# Patient Record
Sex: Female | Born: 1980
Health system: Southern US, Community
[De-identification: ages and names within clinical notes are randomized; demographics above are authoritative.]

## PROBLEM LIST (undated history)

## (undated) ENCOUNTER — Inpatient Hospital Stay (HOSPITAL_COMMUNITY): Payer: Self-pay

## (undated) DIAGNOSIS — N6019 Diffuse cystic mastopathy of unspecified breast: Secondary | ICD-10-CM

## (undated) DIAGNOSIS — E559 Vitamin D deficiency, unspecified: Secondary | ICD-10-CM

## (undated) DIAGNOSIS — Z8489 Family history of other specified conditions: Secondary | ICD-10-CM

## (undated) DIAGNOSIS — Z9889 Other specified postprocedural states: Secondary | ICD-10-CM

## (undated) DIAGNOSIS — R112 Nausea with vomiting, unspecified: Secondary | ICD-10-CM

## (undated) DIAGNOSIS — J189 Pneumonia, unspecified organism: Secondary | ICD-10-CM

## (undated) DIAGNOSIS — O139 Gestational [pregnancy-induced] hypertension without significant proteinuria, unspecified trimester: Secondary | ICD-10-CM

## (undated) DIAGNOSIS — D649 Anemia, unspecified: Secondary | ICD-10-CM

## (undated) DIAGNOSIS — Z9049 Acquired absence of other specified parts of digestive tract: Secondary | ICD-10-CM

## (undated) DIAGNOSIS — J302 Other seasonal allergic rhinitis: Secondary | ICD-10-CM

## (undated) DIAGNOSIS — Z98891 History of uterine scar from previous surgery: Secondary | ICD-10-CM

## (undated) DIAGNOSIS — O321XX0 Maternal care for breech presentation, not applicable or unspecified: Secondary | ICD-10-CM

## (undated) HISTORY — DX: Diffuse cystic mastopathy of unspecified breast: N60.19

## (undated) HISTORY — DX: Acquired absence of other specified parts of digestive tract: Z90.49

## (undated) HISTORY — DX: Vitamin D deficiency, unspecified: E55.9

## (undated) HISTORY — DX: Anemia, unspecified: D64.9

---
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
## 2001-06-22 ENCOUNTER — Other Ambulatory Visit: Admission: RE | Admit: 2001-06-22 | Discharge: 2001-06-22 | Payer: Self-pay | Admitting: *Deleted

## 2002-06-20 ENCOUNTER — Other Ambulatory Visit: Admission: RE | Admit: 2002-06-20 | Discharge: 2002-06-20 | Payer: Self-pay | Admitting: *Deleted

## 2003-08-07 ENCOUNTER — Other Ambulatory Visit: Admission: RE | Admit: 2003-08-07 | Discharge: 2003-08-07 | Payer: Self-pay | Admitting: *Deleted

## 2004-01-15 ENCOUNTER — Encounter: Admission: RE | Admit: 2004-01-15 | Discharge: 2004-01-15 | Payer: Self-pay | Admitting: Family Medicine

## 2004-05-12 ENCOUNTER — Encounter: Admission: RE | Admit: 2004-05-12 | Discharge: 2004-05-12 | Payer: Self-pay | Admitting: Family Medicine

## 2004-12-03 ENCOUNTER — Emergency Department (HOSPITAL_COMMUNITY): Admission: EM | Admit: 2004-12-03 | Discharge: 2004-12-03 | Payer: Self-pay | Admitting: Family Medicine

## 2006-06-01 ENCOUNTER — Other Ambulatory Visit: Admission: RE | Admit: 2006-06-01 | Discharge: 2006-06-01 | Payer: Self-pay | Admitting: Family Medicine

## 2006-06-06 ENCOUNTER — Emergency Department (HOSPITAL_COMMUNITY): Admission: EM | Admit: 2006-06-06 | Discharge: 2006-06-06 | Payer: Self-pay | Admitting: Emergency Medicine

## 2007-07-31 ENCOUNTER — Emergency Department (HOSPITAL_COMMUNITY): Admission: EM | Admit: 2007-07-31 | Discharge: 2007-07-31 | Payer: Self-pay | Admitting: Emergency Medicine

## 2008-06-25 ENCOUNTER — Encounter: Admission: RE | Admit: 2008-06-25 | Discharge: 2008-06-25 | Payer: Self-pay | Admitting: Physician Assistant

## 2009-03-24 ENCOUNTER — Emergency Department (HOSPITAL_COMMUNITY): Admission: EM | Admit: 2009-03-24 | Discharge: 2009-03-24 | Payer: Self-pay | Admitting: Emergency Medicine

## 2009-05-26 ENCOUNTER — Inpatient Hospital Stay (HOSPITAL_COMMUNITY): Admission: AD | Admit: 2009-05-26 | Discharge: 2009-05-26 | Payer: Self-pay | Admitting: Obstetrics and Gynecology

## 2009-06-16 ENCOUNTER — Inpatient Hospital Stay (HOSPITAL_COMMUNITY): Admission: AD | Admit: 2009-06-16 | Discharge: 2009-06-16 | Payer: Self-pay | Admitting: Obstetrics and Gynecology

## 2009-06-16 ENCOUNTER — Ambulatory Visit: Payer: Self-pay | Admitting: Advanced Practice Midwife

## 2009-07-11 ENCOUNTER — Ambulatory Visit: Payer: Self-pay | Admitting: Obstetrics and Gynecology

## 2009-07-11 ENCOUNTER — Inpatient Hospital Stay (HOSPITAL_COMMUNITY): Admission: AD | Admit: 2009-07-11 | Discharge: 2009-07-11 | Payer: Self-pay | Admitting: Obstetrics and Gynecology

## 2009-07-12 ENCOUNTER — Inpatient Hospital Stay (HOSPITAL_COMMUNITY): Admission: AD | Admit: 2009-07-12 | Discharge: 2009-07-12 | Payer: Self-pay | Admitting: Obstetrics and Gynecology

## 2009-07-29 ENCOUNTER — Encounter (INDEPENDENT_AMBULATORY_CARE_PROVIDER_SITE_OTHER): Payer: Self-pay | Admitting: Obstetrics and Gynecology

## 2009-07-29 ENCOUNTER — Inpatient Hospital Stay (HOSPITAL_COMMUNITY): Admission: RE | Admit: 2009-07-29 | Discharge: 2009-08-02 | Payer: Self-pay | Admitting: Obstetrics and Gynecology

## 2010-07-09 LAB — CBC
HCT: 20.1 % — ABNORMAL LOW (ref 36.0–46.0)
MCHC: 34.1 g/dL (ref 30.0–36.0)
MCHC: 34.7 g/dL (ref 30.0–36.0)
MCV: 92.8 fL (ref 78.0–100.0)
Platelets: 137 10*3/uL — ABNORMAL LOW (ref 150–400)
Platelets: 149 10*3/uL — ABNORMAL LOW (ref 150–400)
RBC: 2.17 MIL/uL — ABNORMAL LOW (ref 3.87–5.11)
RBC: 3.46 MIL/uL — ABNORMAL LOW (ref 3.87–5.11)
WBC: 10.1 10*3/uL (ref 4.0–10.5)
WBC: 10.9 10*3/uL — ABNORMAL HIGH (ref 4.0–10.5)
WBC: 11.4 10*3/uL — ABNORMAL HIGH (ref 4.0–10.5)

## 2010-07-09 LAB — RPR: RPR Ser Ql: NONREACTIVE

## 2010-07-09 LAB — RH IMMUNE GLOB WKUP(>/=20WKS)(NOT WOMEN'S HOSP)

## 2010-07-10 LAB — URINALYSIS, ROUTINE W REFLEX MICROSCOPIC
Nitrite: NEGATIVE
Specific Gravity, Urine: 1.01 (ref 1.005–1.030)
pH: 6 (ref 5.0–8.0)

## 2010-07-10 LAB — CBC
HCT: 27.8 % — ABNORMAL LOW (ref 36.0–46.0)
Platelets: 229 10*3/uL (ref 150–400)
WBC: 11.6 10*3/uL — ABNORMAL HIGH (ref 4.0–10.5)

## 2010-07-14 LAB — URINALYSIS, ROUTINE W REFLEX MICROSCOPIC
Nitrite: NEGATIVE
Specific Gravity, Urine: 1.015 (ref 1.005–1.030)
Urobilinogen, UA: 0.2 mg/dL (ref 0.0–1.0)

## 2010-07-22 LAB — COMPREHENSIVE METABOLIC PANEL
AST: 14 U/L (ref 0–37)
Albumin: 2.9 g/dL — ABNORMAL LOW (ref 3.5–5.2)
Chloride: 106 mEq/L (ref 96–112)
Creatinine, Ser: 0.42 mg/dL (ref 0.4–1.2)
GFR calc Af Amer: >60 mL/min (ref >60)
Potassium: 3.5 mEq/L (ref 3.5–5.1)
Total Bilirubin: 0.4 mg/dL (ref 0.3–1.2)
Total Protein: 6.2 g/dL (ref 6.0–8.3)

## 2010-07-22 LAB — DIFFERENTIAL
Basophils Absolute: 0 10*3/uL (ref 0.0–0.1)
Basophils Relative: 0 % (ref 0–1)
Eosinophils Absolute: 0 K/uL (ref 0.0–0.7)
Eosinophils Relative: 1 % (ref 0–5)
Lymphocytes Relative: 22 % (ref 12–46)
Lymphs Abs: 2 10*3/uL (ref 0.7–4.0)
Monocytes Absolute: 0.5 10*3/uL (ref 0.1–1.0)
Monocytes Relative: 6 % (ref 3–12)
Neutro Abs: 6.7 K/uL (ref 1.7–7.7)
Neutrophils Relative %: 72 % (ref 43–77)

## 2010-07-22 LAB — CBC
HCT: 26.9 % — ABNORMAL LOW (ref 36.0–46.0)
Hemoglobin: 9.1 g/dL — ABNORMAL LOW (ref 12.0–15.0)
MCHC: 33.7 g/dL (ref 30.0–36.0)
MCV: 90.5 fL (ref 78.0–100.0)
Platelets: 247 10*3/uL (ref 150–400)
RBC: 2.98 MIL/uL — ABNORMAL LOW (ref 3.87–5.11)
RDW: 14.3 % (ref 11.5–15.5)
WBC: 9.4 10*3/uL (ref 4.0–10.5)

## 2010-07-22 LAB — URINALYSIS, ROUTINE W REFLEX MICROSCOPIC
Bilirubin Urine: NEGATIVE
Glucose, UA: NEGATIVE mg/dL
Hgb urine dipstick: NEGATIVE
Ketones, ur: NEGATIVE mg/dL
Nitrite: NEGATIVE
Protein, ur: NEGATIVE mg/dL
Specific Gravity, Urine: 1.024 (ref 1.005–1.030)
Urobilinogen, UA: 0.2 mg/dL (ref 0.0–1.0)
pH: 7 (ref 5.0–8.0)

## 2010-07-22 LAB — COMPREHENSIVE METABOLIC PANEL WITH GFR
ALT: 9 U/L (ref 0–35)
Alkaline Phosphatase: 50 U/L (ref 39–117)
BUN: 8 mg/dL (ref 6–23)
CO2: 24 meq/L (ref 19–32)
Calcium: 8.9 mg/dL (ref 8.4–10.5)
GFR calc non Af Amer: >60 mL/min (ref >60)
Glucose, Bld: 89 mg/dL (ref 70–99)
Sodium: 136 meq/L (ref 135–145)

## 2010-07-22 LAB — PROTIME-INR
INR: 1 (ref 0.00–1.49)
Prothrombin Time: 13.1 s (ref 11.6–15.2)

## 2010-08-24 ENCOUNTER — Emergency Department (HOSPITAL_COMMUNITY): Payer: BC Managed Care – PPO

## 2010-08-24 ENCOUNTER — Emergency Department (HOSPITAL_COMMUNITY)
Admission: EM | Admit: 2010-08-24 | Discharge: 2010-08-24 | Disposition: A | Discharge status: Home / Self Care | Payer: BC Managed Care – PPO | Attending: Emergency Medicine | Admitting: Emergency Medicine

## 2010-08-24 DIAGNOSIS — R1031 Right lower quadrant pain: Secondary | ICD-10-CM | POA: Insufficient documentation

## 2010-08-24 DIAGNOSIS — N390 Urinary tract infection, site not specified: Secondary | ICD-10-CM | POA: Insufficient documentation

## 2010-08-24 DIAGNOSIS — N83209 Unspecified ovarian cyst, unspecified side: Secondary | ICD-10-CM | POA: Insufficient documentation

## 2010-08-24 LAB — CBC
HCT: 41.6 % (ref 36.0–46.0)
Hemoglobin: 13.8 g/dL (ref 12.0–15.0)
MCH: 28.9 pg (ref 26.0–34.0)
MCHC: 33.2 g/dL (ref 30.0–36.0)
MCV: 87.2 fL (ref 78.0–100.0)
RBC: 4.77 MIL/uL (ref 3.87–5.11)

## 2010-08-24 LAB — URINALYSIS, ROUTINE W REFLEX MICROSCOPIC
Ketones, ur: 40 mg/dL — AB
Nitrite: POSITIVE — AB
Protein, ur: 30 mg/dL — AB
Urobilinogen, UA: 4 mg/dL — ABNORMAL HIGH (ref 0.0–1.0)

## 2010-08-24 LAB — LIPASE, BLOOD: Lipase: 25 U/L (ref 11–59)

## 2010-08-24 LAB — COMPREHENSIVE METABOLIC PANEL
ALT: 10 U/L (ref 0–35)
AST: 18 U/L (ref 0–37)
Albumin: 4.6 g/dL (ref 3.5–5.2)
BUN: 9 mg/dL (ref 6–23)
CO2: 25 mEq/L (ref 19–32)
Chloride: 101 mEq/L (ref 96–112)
Creatinine, Ser: 0.66 mg/dL (ref 0.4–1.2)
Potassium: 3.6 mEq/L (ref 3.5–5.1)

## 2010-08-24 LAB — WET PREP, GENITAL
Clue Cells Wet Prep HPF POC: NONE SEEN
Trich, Wet Prep: NONE SEEN

## 2010-08-24 LAB — DIFFERENTIAL
Lymphocytes Relative: 26 % (ref 12–46)
Lymphs Abs: 2.9 10*3/uL (ref 0.7–4.0)
Monocytes Absolute: 0.5 10*3/uL (ref 0.1–1.0)
Monocytes Relative: 4 % (ref 3–12)
Neutro Abs: 7.8 10*3/uL — ABNORMAL HIGH (ref 1.7–7.7)

## 2010-08-24 MED ORDER — IOHEXOL 300 MG/ML  SOLN
100.0000 mL | Freq: Once | INTRAMUSCULAR | Status: AC | PRN
  Administered 2010-08-24: 100 mL via INTRAVENOUS

## 2010-08-26 LAB — GC/CHLAMYDIA PROBE AMP, GENITAL: Chlamydia, DNA Probe: UNDETERMINED

## 2011-01-13 LAB — D-DIMER, QUANTITATIVE: D-Dimer, Quant: <0.22

## 2011-08-03 ENCOUNTER — Encounter (HOSPITAL_COMMUNITY): Payer: Self-pay | Admitting: Cardiology

## 2011-08-03 ENCOUNTER — Emergency Department (INDEPENDENT_AMBULATORY_CARE_PROVIDER_SITE_OTHER)
Admission: EM | Admit: 2011-08-03 | Discharge: 2011-08-03 | Disposition: A | Discharge status: Home / Self Care | Payer: 59 | Source: Home / Self Care | Attending: Emergency Medicine | Admitting: Emergency Medicine

## 2011-08-03 DIAGNOSIS — S39012A Strain of muscle, fascia and tendon of lower back, initial encounter: Secondary | ICD-10-CM

## 2011-08-03 DIAGNOSIS — S335XXA Sprain of ligaments of lumbar spine, initial encounter: Secondary | ICD-10-CM

## 2011-08-03 DIAGNOSIS — H68009 Unspecified Eustachian salpingitis, unspecified ear: Secondary | ICD-10-CM

## 2011-08-03 MED ORDER — FLUTICASONE PROPIONATE 50 MCG/ACT NA SUSP: 2.0000 | Freq: Every day | NASAL | Status: DC

## 2011-08-03 MED ORDER — FEXOFENADINE-PSEUDOEPHED ER 180-240 MG PO TB24: 1.0000 | ORAL_TABLET | Freq: Every day | ORAL | Status: AC

## 2011-08-03 MED ORDER — CYCLOBENZAPRINE HCL 5 MG PO TABS: 5.0000 mg | ORAL_TABLET | Freq: Three times a day (TID) | ORAL | Status: AC | PRN

## 2011-08-03 MED ORDER — AMOXICILLIN 500 MG PO CAPS: 1000.0000 mg | ORAL_CAPSULE | Freq: Three times a day (TID) | ORAL | Status: AC

## 2011-08-03 MED ORDER — PREDNISONE 5 MG PO KIT: 1.0000 | PACK | Freq: Every day | ORAL | Status: DC

## 2011-08-03 MED ORDER — MELOXICAM 15 MG PO TABS: 15.0000 mg | ORAL_TABLET | Freq: Every day | ORAL | Status: AC

## 2011-08-03 NOTE — ED Provider Notes (Signed)
Chief Complaint  Patient presents with  . Otalgia  . Back Pain  . Nasal Congestion    History of Present Illness:   The patient is a 31 year old female who is in today for 2 problems: Bilateral ear pain and pressure, and lumbar pain without radiation.  She has a two-week history of pain and pressure in both of her ears, right worse than left. Her hearing is diminished. There is no drainage. She denies any ringing in the ears or vertigo. She has had some postnasal drip, nasal congestion with clear drainage, sneezing, itching of the nose, itchy, watery eyes, and sinus pressure. She has a history of seasonal allergies.  She also has had a history of lumbar pain has been going on since the birth of her 2 daughters. Has been worse the past month. There is no radiation, no numbness, tingling, weakness, bladder, or bowel complaints.  Review of Systems:  Other than noted above, the patient denies any of the following symptoms: Systemic:  No fevers, chills, sweats, weight loss or gain, fatigue, or tiredness. Eye:  No redness, pain, discharge, itching, blurred vision, or diplopia. ENT:  No headache, nasal congestion, sneezing, itching, epistaxis, ear pain, congestion, decreased hearing, ringing in ears, vertigo, or tinnitus.  No oral lesions, sore throat, pain on swallowing, or hoarseness. Neck:  No mass, tenderness or adenopathy. Lungs:  No coughing, wheezing, or shortness of breath. Skin:  No rash or itching.  PMFSH:  Past medical history, family history, social history, meds, and allergies were reviewed.  Physical Exam:   Vital signs:  BP 134/86  Pulse 86  Temp(Src) 98.2 F (36.8 C) (Oral)  Resp 20  SpO2 98%  LMP 07/26/2011 General:  Alert and oriented.  In no distress.  Skin warm and dry. Eye:  PERRL, full EOMs, lids and conjunctiva normal.   ENT:  TMs and canals clear.  Nasal mucosa not congested and without drainage.  Mucous membranes moist, no oral lesions, normal dentition, pharynx  clear.  No cranial or facial pain to palplation. Neck:  Supple, full ROM.  No adenopathy, tenderness or mass.  Thyroid normal. Lungs:  Breath sounds clear and equal bilaterally.  No wheezes, rales or rhonchi. Heart:  Rhythm regular, without extrasystoles.  No gallops or murmers. Back: There is tenderness to palpation over the paravertebral muscles bilaterally. There was no tenderness to palpation over the midline or the sacroiliac joints. The back has a full range of motion with slight pain. Straight leg raising was negative. Deep tendon reflexes, muscle strength, and sensation were intact and normal. Skin:  Clear, warm and dry.   Assessment:  The primary encounter diagnosis was Eustachian salpingitis. A diagnosis of Lumbar strain was also pertinent to this visit. The eustachian salpingitis is most likely due to allergic rhinitis, possibly complicated by acute bacterial sinusitis. The lumbar strain was likely brought on by her pregnancies. There is no evidence of lumbar radiculopathy.  Plan:   1.  The following meds were prescribed:   New Prescriptions   AMOXICILLIN (AMOXIL) 500 MG CAPSULE    Take 2 capsules (1,000 mg total) by mouth 3 (three) times daily.   CYCLOBENZAPRINE (FLEXERIL) 5 MG TABLET    Take 1 tablet (5 mg total) by mouth 3 (three) times daily as needed for muscle spasms.   FEXOFENADINE-PSEUDOEPHEDRINE (ALLEGRA-D 24) 180-240 MG PER 24 HR TABLET    Take 1 tablet by mouth daily.   FLUTICASONE (FLONASE) 50 MCG/ACT NASAL SPRAY    Place 2 sprays into  the nose daily.   MELOXICAM (MOBIC) 15 MG TABLET    Take 1 tablet (15 mg total) by mouth daily.   PREDNISONE 5 MG KIT    Take 1 kit (5 mg total) by mouth daily after breakfast. Prednisone 5 mg 6 day dosepack.  Take as directed.   2.  The patient was instructed in symptomatic care and handouts were given. 3.  The patient was told to return if becoming worse in any way, if no better in 3 or 4 days, and given some red flag symptoms that would  indicate earlier return.  Follow up:  The patient was told to follow up with an allergist or ENT doctor if no better in 2 weeks for ear problem. I also suggested that she followup with a physical therapist of her back wasn't getting better in a couple weeks. I suggested she not fill the amoxicillin unless she develops any purulent nasal drainage. She was given back exercises to do as well.       Reuben Likes, MD 08/03/11 2152

## 2011-08-03 NOTE — Discharge Instructions (Signed)
Do back exercises twice daily followed by moist  Heat for 10 minutes. If no better in 2 weeks, follow up with primary care.  Back Pain, Adult Low back pain is very common. About 1 in 5 people have back pain.The cause of low back pain is rarely dangerous. The pain often gets better over time.About half of people with a sudden onset of back pain feel better in just 2 weeks. About 8 in 10 people feel better by 6 weeks.  CAUSES Some common causes of back pain include:  Strain of the muscles or ligaments supporting the spine.   Wear and tear (degeneration) of the spinal discs.   Arthritis.   Direct injury to the back.  DIAGNOSIS Most of the time, the direct cause of low back pain is not known.However, back pain can be treated effectively even when the exact cause of the pain is unknown.Answering your caregiver's questions about your overall health and symptoms is one of the most accurate ways to make sure the cause of your pain is not dangerous. If your caregiver needs more information, he or she may order lab work or imaging tests (X-rays or MRIs).However, even if imaging tests show changes in your back, this usually does not require surgery. HOME CARE INSTRUCTIONS For many people, back pain returns.Since low back pain is rarely dangerous, it is often a condition that people can learn to [facility] their own.   Remain active. It is stressful on the back to sit or stand in one place. Do not sit, drive, or stand in one place for more than 30 minutes at a time. Take short walks on level surfaces as soon as pain allows.Try to increase the length of time you walk each day.   Do not stay in bed.Resting more than 1 or 2 days can delay your recovery.   Do not avoid exercise or work.Your body is made to move.It is not dangerous to be active, even though your back may hurt.Your back will likely heal faster if you return to being active before your pain is gone.   Pay attention to your body when  you bend and lift. Many people have less discomfortwhen lifting if they bend their knees, keep the load close to their bodies,and avoid twisting. Often, the most comfortable positions are those that put less stress on your recovering back.   Find a comfortable position to sleep. Use a firm mattress and lie on your side with your knees slightly bent. If you lie on your back, put a pillow under your knees.   Only take over-the-counter or prescription medicines as directed by your caregiver. Over-the-counter medicines to reduce pain and inflammation are often the most helpful.Your caregiver may prescribe muscle relaxant drugs.These medicines help dull your pain so you can more quickly return to your normal activities and healthy exercise.   Put ice on the injured area.   Put ice in a plastic bag.   Place a towel between your skin and the bag.   Leave the ice on for 15 to 20 minutes, 3 to 4 times a day for the first 2 to 3 days. After that, ice and heat may be alternated to reduce pain and spasms.   Ask your caregiver about trying back exercises and gentle massage. This may be of some benefit.   Avoid feeling anxious or stressed.Stress increases muscle tension and can worsen back pain.It is important to recognize when you are anxious or stressed and learn ways to manage it.Exercise  is a great option.  SEEK MEDICAL CARE IF:  You have pain that is not relieved with rest or medicine.   You have pain that does not improve in 1 week.   You have new symptoms.   You are generally not feeling well.  SEEK IMMEDIATE MEDICAL CARE IF:   You have pain that radiates from your back into your legs.   You develop new bowel or bladder control problems.   You have unusual weakness or numbness in your arms or legs.   You develop nausea or vomiting.   You develop abdominal pain.   You feel faint.  Document Released: 04/06/2005 Document Revised: 03/26/2011 Document Reviewed:  08/25/2010 [facility] Patient Information 2012 [place], [place].

## 2011-08-03 NOTE — ED Notes (Signed)
Pt reports bilat ear pain right worse than left with pressure. Has tried OTC zyrtec-D for the past 2 weeks with now relief. Had episode of feeling like things are "ballooning" in throat and ears. Also has had lower back pain for the past month and has been having lower back pain and spasms today.

## 2011-12-01 ENCOUNTER — Other Ambulatory Visit: Payer: Self-pay | Admitting: Internal Medicine

## 2011-12-01 DIAGNOSIS — R1011 Right upper quadrant pain: Secondary | ICD-10-CM

## 2011-12-01 DIAGNOSIS — R1013 Epigastric pain: Secondary | ICD-10-CM

## 2011-12-03 ENCOUNTER — Ambulatory Visit
Admission: RE | Admit: 2011-12-03 | Discharge: 2011-12-03 | Disposition: A | Discharge status: Home / Self Care | Payer: 59 | Source: Ambulatory Visit | Attending: Internal Medicine | Admitting: Internal Medicine

## 2011-12-03 DIAGNOSIS — R1011 Right upper quadrant pain: Secondary | ICD-10-CM

## 2011-12-03 DIAGNOSIS — R1013 Epigastric pain: Secondary | ICD-10-CM

## 2011-12-04 ENCOUNTER — Other Ambulatory Visit (HOSPITAL_COMMUNITY): Payer: Self-pay | Admitting: Internal Medicine

## 2011-12-04 DIAGNOSIS — K8020 Calculus of gallbladder without cholecystitis without obstruction: Secondary | ICD-10-CM

## 2011-12-04 DIAGNOSIS — R1901 Right upper quadrant abdominal swelling, mass and lump: Secondary | ICD-10-CM

## 2011-12-11 ENCOUNTER — Encounter (HOSPITAL_COMMUNITY)
Admission: RE | Admit: 2011-12-11 | Discharge: 2011-12-11 | Disposition: A | Discharge status: Home / Self Care | Payer: 59 | Source: Ambulatory Visit | Attending: Internal Medicine | Admitting: Internal Medicine

## 2011-12-11 DIAGNOSIS — K8020 Calculus of gallbladder without cholecystitis without obstruction: Secondary | ICD-10-CM

## 2011-12-11 DIAGNOSIS — R1901 Right upper quadrant abdominal swelling, mass and lump: Secondary | ICD-10-CM | POA: Insufficient documentation

## 2011-12-11 MED ORDER — TECHNETIUM TC 99M MEBROFENIN IV KIT
5.0000 | PACK | Freq: Once | INTRAVENOUS | Status: AC | PRN
  Administered 2011-12-11: 5 via INTRAVENOUS

## 2013-04-09 ENCOUNTER — Emergency Department (HOSPITAL_COMMUNITY)
Admission: EM | Admit: 2013-04-09 | Discharge: 2013-04-09 | Disposition: A | Discharge status: Home / Self Care | Payer: BC Managed Care – PPO | Source: Home / Self Care | Attending: Family Medicine | Admitting: Family Medicine

## 2013-04-09 ENCOUNTER — Encounter (HOSPITAL_COMMUNITY): Payer: Self-pay | Admitting: Emergency Medicine

## 2013-04-09 DIAGNOSIS — R110 Nausea: Secondary | ICD-10-CM

## 2013-04-09 DIAGNOSIS — J329 Chronic sinusitis, unspecified: Secondary | ICD-10-CM

## 2013-04-09 DIAGNOSIS — R197 Diarrhea, unspecified: Secondary | ICD-10-CM

## 2013-04-09 MED ORDER — ONDANSETRON 8 MG PO TBDP: 8.0000 mg | ORAL_TABLET | Freq: Three times a day (TID) | ORAL | Status: DC | PRN

## 2013-04-09 MED ORDER — AMOXICILLIN-POT CLAVULANATE 875-125 MG PO TABS: 1.0000 | ORAL_TABLET | Freq: Two times a day (BID) | ORAL | Status: DC

## 2013-04-09 MED ORDER — DOXYCYCLINE HYCLATE 100 MG PO CAPS: 100.0000 mg | ORAL_CAPSULE | Freq: Two times a day (BID) | ORAL | Status: DC

## 2013-04-09 NOTE — ED Notes (Signed)
32 yr old is here with complaints, Sinus press, HA, Nasal - green, achy, fatigue since Wednesday. Denies: SOB, Chest Pain

## 2013-04-09 NOTE — ED Provider Notes (Signed)
CSN: [id number]     Arrival date & time 04/09/13  1841 History   First MD Initiated Contact with Patient 04/09/13 1936     Chief Complaint  Patient presents with  . Headache   (Consider location/radiation/quality/duration/timing/severity/associated sxs/prior Treatment) HPI Comments: 32 year old female presents complaining of headache, sinus pressure, purulent nasal discharge, nausea. This began on Wednesday of this past week it has gotten progressively worse. Additionally, she admits to some diarrhea and some mild crampy abdominal pains. She denies vomiting, fever, chills, hematuria, dysuria, frequency, urgency. She has a history of chronic sinus infections. 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  Patient is a 32 y.o. female presenting with headaches.  Headache Associated symptoms: diarrhea, nausea, sinus pressure and sore throat   Associated symptoms: no abdominal pain, no cough, no dizziness, no fever, no myalgias and no vomiting     Past Medical History  Diagnosis Date  . Asthma    Past Surgical History  Procedure Laterality Date  . Cesarean section  07/29/2009   No family history on file. History  Substance Use Topics  . Smoking status: Never Smoker   . Smokeless tobacco: Not on file  . Alcohol Use: Yes     Comment: rare   OB History   Grav Para Term Preterm Abortions TAB SAB Ect Mult Living                 Review of Systems  Constitutional: Negative for fever and chills.  HENT: Positive for sinus pressure and sore throat.   Eyes: Negative for visual disturbance.  Respiratory: Negative for cough and shortness of breath.   Cardiovascular: Negative for chest pain, palpitations and leg swelling.  Gastrointestinal: Positive for nausea and diarrhea. Negative for vomiting and abdominal pain.  Endocrine: Negative for polydipsia and polyuria.  Genitourinary: Negative for dysuria, urgency and  frequency.  Musculoskeletal: Negative for arthralgias and myalgias.  Skin: Negative for rash.  Neurological: Positive for headaches. Negative for dizziness, weakness and light-headedness.    Allergies  Review of patient's allergies indicates no known allergies.  Home Medications   Current Outpatient Rx  Name  Route  Sig  Dispense  Refill  . buPROPion (WELLBUTRIN XL) 150 MG 24 hr tablet   Oral   Take 150 mg by mouth daily. Takes 1 -2 tablets for mood stablizer         . citalopram (CELEXA) 10 MG tablet   Oral   Take 10 mg by mouth daily.         Marland Kitchen amoxicillin-clavulanate (AUGMENTIN) 875-125 MG per tablet   Oral   Take 1 tablet by mouth every 12 (twelve) hours.   14 tablet   0   . EXPIRED: fluticasone (FLONASE) 50 MCG/ACT nasal spray   Nasal   Place 2 sprays into the nose daily.   16 g   0   . ondansetron (ZOFRAN ODT) 8 MG disintegrating tablet   Oral   Take 1 tablet (8 mg total) by mouth every 8 (eight) hours as needed for nausea or vomiting.   20 tablet   0   . PredniSONE 5 MG KIT   Oral   Take 1 kit (5 mg total) by mouth daily after breakfast. Prednisone 5 mg 6 day dosepack.  Take as directed.   1 kit   0    BP 126/86  Pulse 77  Temp(Src) 98.6 F (37 C) (  Oral)  Resp 16  SpO2 100% Physical Exam  Nursing note and vitals reviewed. Constitutional: She is oriented to person, place, and time. Vital signs are normal. She appears well-developed and well-nourished. No distress.  HENT:  Head: Normocephalic and atraumatic.  Nose: Right sinus exhibits maxillary sinus tenderness and frontal sinus tenderness. Left sinus exhibits maxillary sinus tenderness and frontal sinus tenderness.  Mouth/Throat: Uvula is midline, oropharynx is clear and moist and mucous membranes are normal.  Neck: Normal range of motion. Neck supple.  Cardiovascular: Normal rate, regular rhythm and normal heart sounds.   Pulmonary/Chest: Effort normal and breath sounds normal. No respiratory  distress.  Abdominal: Normal appearance and bowel sounds are normal. There is tenderness (mild) in the right lower quadrant and suprapubic area. There is no CVA tenderness.  Lymphadenopathy:    She has no cervical adenopathy.  Neurological: She is alert and oriented to person, place, and time. She has normal strength. Coordination normal.  Skin: Skin is warm and dry. No rash noted. She is not diaphoretic.  Psychiatric: She has a normal mood and affect. Judgment normal.    ED Course  Procedures (including critical care time) Labs Review Labs Reviewed - No data to display Imaging Review No results found.  EKG Interpretation    Date/Time:    Ventricular Rate:    PR Interval:    QRS Duration:   QT Interval:    QTC Calculation:   R Axis:     Text Interpretation:              MDM   1. Nausea   2. Diarrhea   3. Sinusitis    Treating her nausea with Zofran. I believe she likely has a mild gastroenteritis. Provided a prescription for augmentin if the sinus pressure continues to worse over the next few days.  F/U PRN if any worsening abdominal pain.     New Prescriptions   AMOXICILLIN-CLAVULANATE (AUGMENTIN) 875-125 MG PER TABLET    Take 1 tablet by mouth every 12 (twelve) hours.   ONDANSETRON (ZOFRAN ODT) 8 MG DISINTEGRATING TABLET    Take 1 tablet (8 mg total) by mouth every 8 (eight) hours as needed for nausea or vomiting.       Graylon Good, PA-C 04/09/13 1958

## 2013-04-10 NOTE — ED Provider Notes (Signed)
Medical screening examination/treatment/procedure(s) were performed by a resident physician or non-physician practitioner and as the supervising physician I was immediately available for consultation/collaboration.  Kade Rickels, MD    Phillippa Straub S Shaheen Mende, MD 04/10/13 0737 

## 2013-04-18 ENCOUNTER — Other Ambulatory Visit: Payer: Self-pay | Admitting: Physician Assistant

## 2013-05-21 ENCOUNTER — Encounter: Payer: Self-pay | Admitting: Physician Assistant

## 2013-05-21 DIAGNOSIS — F338 Other recurrent depressive disorders: Secondary | ICD-10-CM | POA: Insufficient documentation

## 2013-05-21 DIAGNOSIS — T7840XA Allergy, unspecified, initial encounter: Secondary | ICD-10-CM | POA: Insufficient documentation

## 2013-05-21 DIAGNOSIS — E559 Vitamin D deficiency, unspecified: Secondary | ICD-10-CM | POA: Insufficient documentation

## 2013-05-21 DIAGNOSIS — F419 Anxiety disorder, unspecified: Secondary | ICD-10-CM

## 2013-05-21 DIAGNOSIS — D649 Anemia, unspecified: Secondary | ICD-10-CM

## 2013-05-24 ENCOUNTER — Ambulatory Visit (INDEPENDENT_AMBULATORY_CARE_PROVIDER_SITE_OTHER): Payer: BC Managed Care – PPO | Admitting: Physician Assistant

## 2013-05-24 ENCOUNTER — Encounter: Payer: Self-pay | Admitting: Physician Assistant

## 2013-05-24 ENCOUNTER — Other Ambulatory Visit (HOSPITAL_COMMUNITY)
Admission: RE | Admit: 2013-05-24 | Discharge: 2013-05-24 | Disposition: A | Discharge status: Home / Self Care | Payer: BC Managed Care – PPO | Source: Ambulatory Visit | Attending: Physician Assistant | Admitting: Physician Assistant

## 2013-05-24 VITALS — BP 120/70 | HR 80 | Temp 99.1°F | Resp 16 | Ht 65.0 in | Wt 164.0 lb

## 2013-05-24 DIAGNOSIS — Z0000 Encounter for general adult medical examination without abnormal findings: Secondary | ICD-10-CM

## 2013-05-24 DIAGNOSIS — Z79899 Other long term (current) drug therapy: Secondary | ICD-10-CM

## 2013-05-24 DIAGNOSIS — K219 Gastro-esophageal reflux disease without esophagitis: Secondary | ICD-10-CM

## 2013-05-24 DIAGNOSIS — Z1151 Encounter for screening for human papillomavirus (HPV): Secondary | ICD-10-CM | POA: Insufficient documentation

## 2013-05-24 DIAGNOSIS — E559 Vitamin D deficiency, unspecified: Secondary | ICD-10-CM

## 2013-05-24 DIAGNOSIS — Z124 Encounter for screening for malignant neoplasm of cervix: Secondary | ICD-10-CM

## 2013-05-24 DIAGNOSIS — Z01419 Encounter for gynecological examination (general) (routine) without abnormal findings: Secondary | ICD-10-CM | POA: Insufficient documentation

## 2013-05-24 LAB — CBC WITH DIFFERENTIAL/PLATELET
Basophils Absolute: 0 10*3/uL (ref 0.0–0.1)
Basophils Relative: 0 % (ref 0–1)
EOS PCT: 1 % (ref 0–5)
Eosinophils Absolute: 0.1 10*3/uL (ref 0.0–0.7)
HEMATOCRIT: 37.2 % (ref 36.0–46.0)
Hemoglobin: 12.6 g/dL (ref 12.0–15.0)
LYMPHS ABS: 2.2 10*3/uL (ref 0.7–4.0)
LYMPHS PCT: 21 % (ref 12–46)
MCH: 29.6 pg (ref 26.0–34.0)
MCHC: 33.9 g/dL (ref 30.0–36.0)
MCV: 87.3 fL (ref 78.0–100.0)
Monocytes Absolute: 0.4 10*3/uL (ref 0.1–1.0)
Monocytes Relative: 4 % (ref 3–12)
Neutro Abs: 8 10*3/uL — ABNORMAL HIGH (ref 1.7–7.7)
Neutrophils Relative %: 74 % (ref 43–77)
PLATELETS: 350 10*3/uL (ref 150–400)
RBC: 4.26 MIL/uL (ref 3.87–5.11)
RDW: 13.1 % (ref 11.5–15.5)
WBC: 10.8 10*3/uL — AB (ref 4.0–10.5)

## 2013-05-24 LAB — HEMOGLOBIN A1C
HEMOGLOBIN A1C: 5.4 % (ref <5.7)
Mean Plasma Glucose: 108 mg/dL (ref <117)

## 2013-05-24 MED ORDER — PANTOPRAZOLE SODIUM 40 MG PO TBEC: 40.0000 mg | DELAYED_RELEASE_TABLET | Freq: Every day | ORAL | Status: DC

## 2013-05-24 NOTE — Patient Instructions (Addendum)
Peptic Ulcer A peptic ulcer is a sore in the lining of in your esophagus (esophageal ulcer), stomach (gastric ulcer), or in the first part of your small intestine (duodenal ulcer). The ulcer causes erosion into the deeper tissue. CAUSES  Normally, the lining of the stomach and the small intestine protects itself from the acid that digests food. The protective lining can be damaged by:  An infection caused by a bacterium called Helicobacter pylori (H. pylori).  Regular use of nonsteroidal anti-inflammatory drugs (NSAIDs), such as ibuprofen or aspirin.  Smoking tobacco. Other risk factors include being older than 22, drinking alcohol excessively, and having a family history of ulcer disease.  SYMPTOMS   Burning pain or gnawing in the area between the chest and the belly button.  Heartburn.  Nausea and vomiting.  Bloating. The pain can be worse on an empty stomach and at night. If the ulcer results in bleeding, it can cause:  Black, tarry stools.  Vomiting of bright red blood.  Vomiting of coffee ground looking materials. DIAGNOSIS  A diagnosis is usually made based upon your history and an exam. Other tests and procedures may be performed to find the cause of the ulcer. Finding a cause will help determine the best treatment. Tests and procedures may include:  Blood tests, stool tests, or breath tests to check for the bacterium H. pylori.  An upper gastrointestinal (GI) series of the esophagus, stomach, and small intestine.  An endoscopy to examine the esophagus, stomach, and small intestine.  A biopsy. TREATMENT  Treatment may include:  Eliminating the cause of the ulcer, such as smoking, NSAIDs, or alcohol.  Medicines to reduce the amount of acid in your digestive tract.  Antibiotic medicines if the ulcer is caused by the H. pylori bacterium.  An upper endoscopy to treat a bleeding ulcer.  Surgery if the bleeding is severe or if the ulcer created a hole somewhere in the  digestive system. HOME CARE INSTRUCTIONS   Avoid tobacco, alcohol, and caffeine. Smoking can increase the acid in the stomach, and continued smoking will impair the healing of ulcers.  Avoid foods and drinks that seem to cause discomfort or aggravate your ulcer.  Only take medicines as directed by your caregiver. Do not substitute over-the-counter medicines for prescription medicines without talking to your caregiver.  Keep any follow-up appointments and tests as directed. SEEK MEDICAL CARE IF:   Your do not improve within 7 days of starting treatment.  You have ongoing indigestion or heartburn. SEEK IMMEDIATE MEDICAL CARE IF:   You have sudden, sharp, or persistent abdominal pain.  You have bloody or dark black, tarry stools.  You vomit blood or vomit that looks like coffee grounds.  You become light headed, weak, or feel faint.  You become sweaty or clammy. MAKE SURE YOU:   Understand these instructions.  Will watch your condition.  Will get help right away if you are not doing well or get worse. Document Released: 04/03/2000 Document Revised: 12/30/2011 Document Reviewed: 11/04/2011 [facility] Patient Information 2014 [place]. Diet for Gastroesophageal Reflux Disease, Adult Reflux (acid reflux) is when acid from your stomach flows up into the esophagus. When acid comes in contact with the esophagus, the acid causes irritation and soreness (inflammation) in the esophagus. When reflux happens often or so severely that it causes damage to the esophagus, it is called gastroesophageal reflux disease (GERD). Nutrition therapy can help ease the discomfort of GERD. FOODS OR DRINKS TO AVOID OR LIMIT  Smoking or chewing  tobacco. Nicotine is one of the most potent stimulants to acid production in the gastrointestinal tract.  Caffeinated and decaffeinated coffee and black tea.  Regular or low-calorie carbonated beverages or energy drinks (caffeine-free carbonated beverages are  allowed).   Strong spices, such as black pepper, white pepper, red pepper, cayenne, curry powder, and chili powder.  Peppermint or spearmint.  Chocolate.  High-fat foods, including meats and fried foods. Extra added fats including oils, butter, salad dressings, and nuts. Limit these to less than 8 tsp per day.  Fruits and vegetables if they are not tolerated, such as citrus fruits or tomatoes.  Alcohol.  Any food that seems to aggravate your condition. If you have questions regarding your diet, call your caregiver or a registered dietitian. OTHER THINGS THAT MAY HELP GERD INCLUDE:   Eating your meals slowly, in a relaxed setting.  Eating 5 to 6 small meals per day instead of 3 large meals.  Eliminating food for a period of time if it causes distress.  Not lying down until 3 hours after eating a meal.  Keeping the head of your bed raised 6 to 9 inches (15 to 23 cm) by using a foam wedge or blocks under the legs of the bed. Lying flat may make symptoms worse.  Being physically active. Weight loss may be helpful in reducing reflux in overweight or obese adults.  Wear loose fitting clothing EXAMPLE MEAL PLAN This meal plan is approximately 2,000 calories based on [email or web address] meal planning guidelines. Breakfast   cup cooked oatmeal.  1 cup strawberries.  1 cup low-fat milk.  1 oz almonds. Snack  1 cup cucumber slices.  6 oz yogurt (made from low-fat or fat-free milk). Lunch  2 slice whole-wheat bread.  2 oz sliced [place].  2 tsp mayonnaise.  1 cup blueberries.  1 cup snap peas. Snack  6 whole-wheat crackers.  1 oz string cheese. Dinner   cup brown rice.  1 cup mixed veggies.  1 tsp olive oil.  3 oz grilled fish. Document Released: 04/06/2005 Document Revised: 06/29/2011 Document Reviewed: 02/20/2011 [facility] Patient Information 2014 [place], [place].  Follow up with Dr. Marylene Land at [place]

## 2013-05-24 NOTE — Progress Notes (Signed)
Complete Physical HPI Patient presents for complete physical.   Patient's blood pressure has been controlled at home. Patient denies chest pain, shortness of breath, dizziness. BP: 120/70 mmHg  Patient thinks she may have a stomach ulcer, she when to urgent care in June for something else and was given prevacid but continues to get bad. Split up with her husband in May, mutual, but still very stressful. Stomach pain, burning pain, gnawing for 5-6 months, worsening. Occ black stools but states she can associated it with Pepto. Very rarely takes NSAIDS, alcohol once a month if that. Denies weight loss.  RUQ pain too, history of [place] AB- IMPRESSION: Cholelithiasis with no signs of associated cholecystitis. Otherwise unremarkable. Asthma controlled, some use of albuterol inhaler with change in seasons/allergies but very rare.  Depression/anxiety- wellbutrin and celexa helps- no panic attacks Has 32 year old twin girls.   Current Medications:  Current Outpatient Prescriptions on File Prior to Visit  Medication Sig Dispense Refill  . albuterol (PROVENTIL HFA;VENTOLIN HFA) 108 (90 BASE) MCG/ACT inhaler Inhale 2 puffs into the lungs every 6 (six) hours as needed for wheezing or shortness of breath.      Marland Kitchen buPROPion (WELLBUTRIN XL) 150 MG 24 hr tablet Take 150 mg by mouth daily. Takes 1 -2 tablets for mood stablizer      . citalopram (CELEXA) 10 MG tablet Take 10 mg by mouth daily.      Marland Kitchen levonorgestrel (MIRENA) 20 MCG/24HR IUD 1 each by Intrauterine route once.      . valACYclovir (VALTREX) 1000 MG tablet TAKE TWO TABLETS BY MOUTH TWICE DAILY  360 tablet  1  . fluticasone (FLONASE) 50 MCG/ACT nasal spray Place 2 sprays into the nose daily.  16 g  0   No current facility-administered medications on file prior to visit.   Health Maintenance:  Tetanus: 04/2012 Pneumovax: N/A Flu vaccine:2014 Zostavax: N/A  Pap:  2012 normal MGM: N/A DEXA: N/A Colonoscopy: N/A EGD: N.A   Allergies: No Known  Allergies Medical History:  Past Medical History  Diagnosis Date  . Asthma   . Allergy   . Anemia   . Anxiety   . Fibrocystic breast   . Vitamin D deficiency    Surgical History:  Past Surgical History  Procedure Laterality Date  . Cesarean section  07/29/2009  . Wisdom tooth extraction  2002   Family History:  Family History  Problem Relation Age of Onset  . Hypertension Mother   . Diabetes Father   . Cancer Father     prostate  . Cancer Other     breast  . Hypertension Other   . Heart disease Other    Social History:  History   Social History  . Marital Status: Single    Spouse Name: N/A    Number of Children: N/A  . Years of Education: N/A   Occupational History  . Not on file.   Social History Main Topics  . Smoking status: Never Smoker   . Smokeless tobacco: Never Used  . Alcohol Use: Yes     Comment: rare  . Drug Use: No  . Sexual Activity: Yes    Birth Control/ Protection: IUD   Other Topics Concern  . Not on file   Social History Narrative  . No narrative on file   ROS Constitutional: Denies weight loss/gain, headaches, insomnia, fatigue, night sweats, and change in appetite. Eyes: Denies redness, blurred vision, diplopia, discharge, itchy, watery eyes.  ENT: Denies discharge, congestion, post  nasal drip, sore throat, earache, hearing loss, dental pain, Tinnitus, Vertigo, Sinus pain, snoring.  Cardio: Denies chest pain, palpitations, irregular heartbeat, dyspnea, diaphoresis, orthopnea, PND, claudication, edema Respiratory: denies cough, dyspnea, pleurisy, hoarseness, wheezing.  Gastrointestinal: + GERD, AB Pain Denies dysphagia,cramps, nausea, vomiting, bloating, diarrhea, constipation, hematemesis, melena, hematochezia, hemorrhoids Genitourinary: Denies dysuria, frequency, urgency, nocturia, hesitancy, discharge, hematuria, flank pain Breast: Denies Breast lumps, nipple discharge, bleeding.  Musculoskeletal: Denies arthralgia, myalgia,  stiffness, Jt. Swelling, pain, Skin: Denies pruritis, rash, hives,  acne, eczema, changing in skin lesion Neuro: Denies Weakness, tremor, incoordination, spasms, paresthesia, pain Psychiatric: Denies confusion, memory loss, sensory loss Endocrine: Denies change in weight, skin, hair change, nocturia, and paresthesia, Diabetic Denies Polys, visual blurring, hyper /hypo glycemic episodes.  Heme/Lymph: Denies Excessive bleeding, bruising, enlarged lymph nodes  Physical Exam: Estimated body mass index is 27.29 kg/(m^2) as calculated from the following:   Height as of this encounter: 5\' 5"  (1.651 m).   Weight as of this encounter: 164 lb (74.39 kg). Filed Vitals:   05/24/13 1413  BP: 120/70  Pulse: 80  Temp: 99.1 F (37.3 C)  Resp: 16   General Appearance: Well nourished, in no apparent distress. Eyes: PERRLA, EOMs, conjunctiva no swelling or erythema, normal fundi and vessels. Sinuses: No Frontal/maxillary tenderness ENT/Mouth: Ext aud canals clear, normal light reflex with TMs without erythema, bulging.  Good dentition. No erythema, swelling, or exudate on post pharynx. Tonsils not swollen or erythematous. Hearing normal.  Neck: Supple, thyroid normal. No bruits Respiratory: Respiratory effort normal, BS equal bilaterally without rales, rhonchi, wheezing or stridor. Cardio: RRR without murmurs, rubs or gallops. Brisk peripheral pulses without edema.  Chest: symmetric, with normal excursions and percussion. Breasts: Symmetric, without lumps, nipple discharge, retractions. Abdomen: Soft, +BS. Non tender, no guarding, rebound, hernias, masses, or organomegaly. .  Lymphatics: Non tender without lymphadenopathy.  Genitourinary:  Questionable projections on external genitalia, at 10 o clock on labia and at the base of the vaginal opening, normal urethral meatus, vaginal mucosa and cervix, PAP smear obtained. Musculoskeletal: Full ROM all peripheral extremities,5/5 strength, and normal  gait. Skin: Warm, dry without rashes, lesions, ecchymosis.  Neuro: Cranial nerves intact, reflexes equal bilaterally. Normal muscle tone, no cerebellar symptoms. Sensation intact.  Psych: Awake and oriented X 3, normal affect, Insight and Judgment appropriate.   EKG: WNL no changes.  Assessment and Plan: Asthma- continue albuterol PRN, controlled  Allergy- OTC PRN  Anemia- Check labs  Anxiety- controlled  Fibrocystic breast- monitor  Vitamin D deficiency- check labs  GERD vs Ulcer vs Gallstone- will give info, test H Pylori, and give PPI- if not better need EGD. , given dexilant samples and protonix called in PAP sent ? Genital warts- will follow up with OB/GYN Discussed med's effects and SE's. Screening labs and tests as requested with regular follow-up as recommended.   Vicie Mutters 2:50 PM

## 2013-05-25 LAB — HEPATIC FUNCTION PANEL
ALBUMIN: 4.7 g/dL (ref 3.5–5.2)
ALK PHOS: 68 U/L (ref 39–117)
ALT: 11 U/L (ref 0–35)
AST: 17 U/L (ref 0–37)
BILIRUBIN DIRECT: 0.1 mg/dL (ref 0.0–0.3)
BILIRUBIN TOTAL: 0.3 mg/dL (ref 0.2–1.2)
Indirect Bilirubin: 0.2 mg/dL (ref 0.2–1.2)
Total Protein: 7.8 g/dL (ref 6.0–8.3)

## 2013-05-25 LAB — LIPID PANEL
CHOL/HDL RATIO: 4 ratio
Cholesterol: 173 mg/dL (ref 0–200)
HDL: 43 mg/dL (ref >39)
LDL CALC: 111 mg/dL — AB (ref 0–99)
Triglycerides: 95 mg/dL (ref <150)
VLDL: 19 mg/dL (ref 0–40)

## 2013-05-25 LAB — HELICOBACTER PYLORI ABS-IGG+IGA, BLD
H Pylori IgG: <0.4 {ISR}
HELICOBACTER PYLORI AB, IGA: 2.1 U/mL (ref <9.0)

## 2013-05-25 LAB — IRON AND TIBC
%SAT: 20 % (ref 20–55)
IRON: 67 ug/dL (ref 42–145)
TIBC: 334 ug/dL (ref 250–470)
UIBC: 267 ug/dL (ref 125–400)

## 2013-05-25 LAB — MICROALBUMIN / CREATININE URINE RATIO
Creatinine, Urine: 121.6 mg/dL
MICROALB/CREAT RATIO: 6 mg/g (ref 0.0–30.0)
Microalb, Ur: 0.73 mg/dL (ref 0.00–1.89)

## 2013-05-25 LAB — URINALYSIS, ROUTINE W REFLEX MICROSCOPIC
BILIRUBIN URINE: NEGATIVE
GLUCOSE, UA: NEGATIVE mg/dL
HGB URINE DIPSTICK: NEGATIVE
KETONES UR: NEGATIVE mg/dL
Leukocytes, UA: NEGATIVE
Nitrite: NEGATIVE
PH: 6.5 (ref 5.0–8.0)
Protein, ur: NEGATIVE mg/dL
Specific Gravity, Urine: 1.021 (ref 1.005–1.030)
Urobilinogen, UA: 0.2 mg/dL (ref 0.0–1.0)

## 2013-05-25 LAB — BASIC METABOLIC PANEL WITH GFR
BUN: 11 mg/dL (ref 6–23)
CHLORIDE: 103 meq/L (ref 96–112)
CO2: 27 meq/L (ref 19–32)
CREATININE: 0.74 mg/dL (ref 0.50–1.10)
Calcium: 9.7 mg/dL (ref 8.4–10.5)
GFR, Est African American: >89 mL/min
GFR, Est Non African American: >89 mL/min
Glucose, Bld: 89 mg/dL (ref 70–99)
Potassium: 4.1 mEq/L (ref 3.5–5.3)
Sodium: 136 mEq/L (ref 135–145)

## 2013-05-25 LAB — TSH: TSH: 0.976 u[IU]/mL (ref 0.350–4.500)

## 2013-05-25 LAB — VITAMIN D 25 HYDROXY (VIT D DEFICIENCY, FRACTURES): Vit D, 25-Hydroxy: 33 ng/mL (ref 30–89)

## 2013-05-25 LAB — MAGNESIUM: Magnesium: 2 mg/dL (ref 1.5–2.5)

## 2013-05-25 LAB — VITAMIN B12: VITAMIN B 12: 652 pg/mL (ref 211–911)

## 2013-05-25 LAB — INSULIN, FASTING: Insulin fasting, serum: 16 u[IU]/mL (ref 3–28)

## 2013-05-29 ENCOUNTER — Encounter: Payer: Self-pay | Admitting: Physician Assistant

## 2013-07-10 ENCOUNTER — Other Ambulatory Visit: Payer: Self-pay | Admitting: Pediatrics

## 2013-07-13 ENCOUNTER — Other Ambulatory Visit: Payer: Self-pay | Admitting: Physician Assistant

## 2013-08-13 ENCOUNTER — Emergency Department (HOSPITAL_COMMUNITY): Payer: BC Managed Care – PPO

## 2013-08-13 ENCOUNTER — Emergency Department (HOSPITAL_COMMUNITY)
Admission: EM | Admit: 2013-08-13 | Discharge: 2013-08-13 | Disposition: A | Discharge status: Home / Self Care | Payer: BC Managed Care – PPO | Attending: Emergency Medicine | Admitting: Emergency Medicine

## 2013-08-13 ENCOUNTER — Encounter (HOSPITAL_COMMUNITY): Payer: Self-pay | Admitting: Emergency Medicine

## 2013-08-13 DIAGNOSIS — K8050 Calculus of bile duct without cholangitis or cholecystitis without obstruction: Secondary | ICD-10-CM

## 2013-08-13 DIAGNOSIS — Z79899 Other long term (current) drug therapy: Secondary | ICD-10-CM | POA: Insufficient documentation

## 2013-08-13 DIAGNOSIS — Z8742 Personal history of other diseases of the female genital tract: Secondary | ICD-10-CM | POA: Insufficient documentation

## 2013-08-13 DIAGNOSIS — F411 Generalized anxiety disorder: Secondary | ICD-10-CM | POA: Insufficient documentation

## 2013-08-13 DIAGNOSIS — K8020 Calculus of gallbladder without cholecystitis without obstruction: Secondary | ICD-10-CM

## 2013-08-13 DIAGNOSIS — IMO0002 Reserved for concepts with insufficient information to code with codable children: Secondary | ICD-10-CM | POA: Insufficient documentation

## 2013-08-13 DIAGNOSIS — J45909 Unspecified asthma, uncomplicated: Secondary | ICD-10-CM | POA: Insufficient documentation

## 2013-08-13 DIAGNOSIS — E559 Vitamin D deficiency, unspecified: Secondary | ICD-10-CM | POA: Insufficient documentation

## 2013-08-13 DIAGNOSIS — Z862 Personal history of diseases of the blood and blood-forming organs and certain disorders involving the immune mechanism: Secondary | ICD-10-CM | POA: Insufficient documentation

## 2013-08-13 LAB — CBC WITH DIFFERENTIAL/PLATELET
Basophils Absolute: 0 10*3/uL (ref 0.0–0.1)
Basophils Relative: 0 % (ref 0–1)
Eosinophils Absolute: 0.2 10*3/uL (ref 0.0–0.7)
Eosinophils Relative: 1 % (ref 0–5)
HCT: 39.7 % (ref 36.0–46.0)
Hemoglobin: 13.1 g/dL (ref 12.0–15.0)
Lymphocytes Relative: 26 % (ref 12–46)
Lymphs Abs: 2.9 10*3/uL (ref 0.7–4.0)
MCH: 29.8 pg (ref 26.0–34.0)
MCHC: 33 g/dL (ref 30.0–36.0)
MCV: 90.2 fL (ref 78.0–100.0)
Monocytes Absolute: 0.5 10*3/uL (ref 0.1–1.0)
Monocytes Relative: 5 % (ref 3–12)
Neutro Abs: 7.6 10*3/uL (ref 1.7–7.7)
Neutrophils Relative %: 68 % (ref 43–77)
Platelets: 288 10*3/uL (ref 150–400)
RBC: 4.4 MIL/uL (ref 3.87–5.11)
RDW: 12.8 % (ref 11.5–15.5)
WBC: 11.2 10*3/uL — ABNORMAL HIGH (ref 4.0–10.5)

## 2013-08-13 LAB — COMPREHENSIVE METABOLIC PANEL
ALT: 11 U/L (ref 0–35)
AST: 16 U/L (ref 0–37)
Albumin: 4.2 g/dL (ref 3.5–5.2)
Alkaline Phosphatase: 73 U/L (ref 39–117)
BUN: 12 mg/dL (ref 6–23)
CO2: 23 mEq/L (ref 19–32)
Calcium: 9.7 mg/dL (ref 8.4–10.5)
Chloride: 101 mEq/L (ref 96–112)
Creatinine, Ser: 0.67 mg/dL (ref 0.50–1.10)
GFR calc Af Amer: >90 mL/min (ref >90)
GFR calc non Af Amer: >90 mL/min (ref >90)
Glucose, Bld: 88 mg/dL (ref 70–99)
Potassium: 3.9 mEq/L (ref 3.7–5.3)
Sodium: 138 mEq/L (ref 137–147)
Total Bilirubin: 0.2 mg/dL — ABNORMAL LOW (ref 0.3–1.2)
Total Protein: 7.9 g/dL (ref 6.0–8.3)

## 2013-08-13 LAB — LIPASE, BLOOD: Lipase: 34 U/L (ref 11–59)

## 2013-08-13 MED ORDER — HYDROMORPHONE HCL PF 1 MG/ML IJ SOLN
1.0000 mg | Freq: Once | INTRAMUSCULAR | Status: AC
  Administered 2013-08-13: 1 mg via INTRAVENOUS
  Filled 2013-08-13: qty 1

## 2013-08-13 MED ORDER — OXYCODONE-ACETAMINOPHEN 5-325 MG PO TABS: ORAL_TABLET | ORAL | Status: DC

## 2013-08-13 MED ORDER — PROMETHAZINE HCL 25 MG PO TABS: 25.0000 mg | ORAL_TABLET | Freq: Four times a day (QID) | ORAL | Status: DC | PRN

## 2013-08-13 MED ORDER — IBUPROFEN 600 MG PO TABS: 600.0000 mg | ORAL_TABLET | Freq: Four times a day (QID) | ORAL | Status: DC | PRN

## 2013-08-13 MED ORDER — SODIUM CHLORIDE 0.9 % IV BOLUS (SEPSIS)
1000.0000 mL | INTRAVENOUS | Status: AC
  Administered 2013-08-13: 1000 mL via INTRAVENOUS

## 2013-08-13 NOTE — ED Provider Notes (Signed)
CSN: [id number]     Arrival date & time 08/13/13  1910 History   First MD Initiated Contact with Patient 08/13/13 1955     Chief Complaint  Patient presents with  . Abdominal Pain     (Consider location/radiation/quality/duration/timing/severity/associated sxs/prior Treatment) HPI Pt is a 33yo female with hx of gallstones presenting to ED c/o RUQ pain with associated nausea that started around 14:30 this afternoon while pt was taking a shower. Pain is constant, waxing and waning, sharp, stabbing, aching, 8/10 at worst. Has not tried any pain medication PTA. Reports nausea but denies vomiting or diarrhea. Does states she feels more bloated today. Denies urinary or vaginal symptoms. Pt states last she had an ultrasound of her gallbladder which showed multiple gall stones.  States over last 82mo she has had "stomah ulcer type symptoms" and was placed on pantoprazole by her PCP with minimal relief. Abdominal surgical hx significant for c-section. She has not seen general surgery.  Last ate grilled chicken around 13:00 or 14:00 this afternoon.   Past Medical History  Diagnosis Date  . Asthma   . Allergy   . Anemia   . Anxiety   . Fibrocystic breast   . Vitamin D deficiency    Past Surgical History  Procedure Laterality Date  . Cesarean section  07/29/2009  . Wisdom tooth extraction  2002   Family History  Problem Relation Age of Onset  . Hypertension Mother   . Diabetes Father   . Cancer Father     prostate  . Cancer Other     breast  . Hypertension Other   . Heart disease Other    History  Substance Use Topics  . Smoking status: Never Smoker   . Smokeless tobacco: Never Used  . Alcohol Use: Yes     Comment: rare   OB History   Grav Para Term Preterm Abortions TAB SAB Ect Mult Living                 Review of Systems  Constitutional: Negative for fever, chills, appetite change and fatigue.  Respiratory: Negative for shortness of breath.   Cardiovascular: Negative for  chest pain.  Gastrointestinal: Positive for nausea and abdominal pain (RUQ radiating down right flank and into right mid-back). Negative for vomiting and diarrhea.  Genitourinary: Positive for flank pain ( right). Negative for dysuria, frequency, hematuria, vaginal discharge and vaginal pain.  Musculoskeletal: Positive for back pain (right mid back).  All other systems reviewed and are negative.     Allergies  Review of patient's allergies indicates no known allergies.  Home Medications   Prior to Admission medications   Medication Sig Start Date End Date Taking? Authorizing Provider  albuterol (PROVENTIL HFA;VENTOLIN HFA) 108 (90 BASE) MCG/ACT inhaler Inhale 2 puffs into the lungs every 6 (six) hours as needed for wheezing or shortness of breath.   Yes Historical Provider, MD  buPROPion (WELLBUTRIN XL) 150 MG 24 hr tablet Take 150 mg by mouth daily.    Yes Historical Provider, MD  cetirizine-pseudoephedrine (ZYRTEC-D) 5-120 MG per tablet Take 1 tablet by mouth 2 (two) times daily.   Yes Historical Provider, MD  cholecalciferol (VITAMIN D) 1000 UNITS tablet Take 5,000 Units by mouth daily.   Yes Historical Provider, MD  citalopram (CELEXA) 10 MG tablet Take 10 mg by mouth daily.   Yes Historical Provider, MD  levonorgestrel (MIRENA) 20 MCG/24HR IUD 1 each by Intrauterine route once. 09/18/09  Yes Historical Provider, MD  pantoprazole ([place])  40 MG tablet Take 40 mg by mouth daily.   Yes Historical Provider, MD  valACYclovir (VALTREX) 1000 MG tablet Take 1,000 mg by mouth 2 (two) times daily as needed (fever blisters.).   Yes Historical Provider, MD  fluticasone (FLONASE) 50 MCG/ACT nasal spray Place 2 sprays into the nose daily. 08/03/11 08/02/12  Harden Mo, MD   BP 127/64  Pulse 77  Temp(Src) 98.6 F (37 C) (Oral)  Resp 18  SpO2 100% Physical Exam  Nursing note and vitals reviewed. Constitutional: She appears well-developed and well-nourished. No distress.  Pt lying  comfortably in exam bed, NAD.   HENT:  Head: Normocephalic and atraumatic.  Eyes: Conjunctivae are normal. No scleral icterus.  Neck: Normal range of motion.  Cardiovascular: Normal rate, regular rhythm and normal heart sounds.   Pulmonary/Chest: Effort normal and breath sounds normal. No respiratory distress. She has no wheezes. She has no rales. She exhibits no tenderness.  Abdominal: Soft. Bowel sounds are normal. She exhibits no distension and no mass. There is tenderness (RUQ, positive murphy's sign) in the right upper quadrant. There is positive Murphy's sign. There is no rebound, no guarding and no CVA tenderness.  Soft, non-distended, tenderness in RUQ.    Musculoskeletal: Normal range of motion.  Neurological: She is alert.  Skin: Skin is warm and dry. She is not diaphoretic.    ED Course  Procedures (including critical care time) Labs Review Labs Reviewed  CBC WITH DIFFERENTIAL - Abnormal; Notable for the following:    WBC 11.2 (*)    All other components within normal limits  COMPREHENSIVE METABOLIC PANEL - Abnormal; Notable for the following:    Total Bilirubin 0.2 (*)    All other components within normal limits  LIPASE, BLOOD    Imaging Review US Abdomen Complete  08/13/2013   CLINICAL DATA:  Right upper quadrant abdominal pain.  EXAM: ULTRASOUND ABDOMEN COMPLETE  COMPARISON:  Abdominal ultrasound performed 12/03/2011, and CT of the abdomen and pelvis performed 08/24/2010  FINDINGS: Gallbladder:  Multiple mobile stones are seen dependently within the gallbladder, measuring up to 5 mm in size. The gallbladder is otherwise unremarkable in appearance. No pericholecystic fluid or gallbladder wall thickening is seen. No ultrasonographic Murphy's sign is seen.  Common bile duct:  Diameter: 0.2 cm, within normal limits in caliber.  Liver:  No focal lesion identified. Within normal limits in parenchymal echogenicity.  IVC:  No abnormality visualized.  Pancreas:  Visualized portion  unremarkable.  Spleen:  Size and appearance within normal limits.  Right Kidney:  Length: 11.7 cm. Echogenicity within normal limits. No mass or hydronephrosis visualized.  Left Kidney:  Length: 11.1 cm. Echogenicity within normal limits. No mass or hydronephrosis visualized.  Abdominal aorta:  No aneurysm visualized.  Other findings:  None.  IMPRESSION: 1. Cholelithiasis noted; gallbladder otherwise unremarkable in appearance. 2. Otherwise unremarkable abdominal ultrasound.   Electronically Signed   By: Garald Balding M.D.   On: 08/13/2013 22:45     EKG Interpretation None      MDM   Final diagnoses:  Biliary colic  Cholelithiasis    Pt is a 33yo female with hx of gallstones c/o symptoms consistent with biliary colic. Cholecystitis vs cholelithiasis. Will also consider pancreatitis, gastritis.  Pt appears well, non-toxic, afebrile.  Abd-tenderness in RUQ.  Pain improved with IV dilaudid and IV fluids.   CBC, CMP, Lipase: unremarkable.  Abd U/S: cholelithiasis noted; gallbladder otherwise unremarkable in appearance.    Discussed pt with Dr. Winfred Leeds who  also examined pt. Pt may be discharged home to f/u with PCP. Contact info for [facility]. Rx: percocet, ibuprofen, and phenergan.  Return precautions provided. Pt verbalized understanding and agreement with tx plan.     Noland Fordyce, PA-C 08/14/13 [phone]

## 2013-08-13 NOTE — ED Notes (Signed)
Patient is alert and oriented x3.  She is complaining of RUQ pain that started today at  14:30 while she was taking a shower.  She denies ever having this issue before.   Currently she rates her pain 6 of 10 with nausea intermittently.

## 2013-08-13 NOTE — ED Notes (Signed)
US at bedside

## 2013-08-13 NOTE — ED Provider Notes (Signed)
Complains of right upper quadrant pain rating to right flank onset 2 PM today. Ate pizza for lunch. Admits to nausea no vomiting feels improved since treatment in the emergency department. Patient with known gallstones on exam alert nontoxic abdomen nondistended normal active bowel sounds tender at right upper quadrant negative Percell Miller sign  Orlie Dakin, MD 08/13/13 2135

## 2013-08-13 NOTE — Discharge Instructions (Signed)
Cholelithiasis Cholelithiasis (also called gallstones) is a form of gallbladder disease in which gallstones form in your gallbladder. The gallbladder is an organ that stores bile made in the liver, which helps digest fats. Gallstones begin as small crystals and slowly grow into stones. Gallstone pain occurs when the gallbladder spasms and a gallstone is blocking the duct. Pain can also occur when a stone passes out of the duct.  RISK FACTORS  Being female.   Having multiple pregnancies. Health care providers sometimes advise removing diseased gallbladders before future pregnancies.   Being obese.  Eating a diet heavy in fried foods and fat.   Being older than 63 years and increasing age.   Prolonged use of medicines containing female hormones.   Having diabetes mellitus.   Rapidly losing weight.   Having a family history of gallstones (heredity).  SYMPTOMS  Nausea.   Vomiting.  Abdominal pain.   Yellowing of the skin (jaundice).   Sudden pain. It may persist from several minutes to several hours.  Fever.   Tenderness to the touch. In some cases, when gallstones do not move into the bile duct, people have no pain or symptoms. These are called "silent" gallstones.  TREATMENT Silent gallstones do not need treatment. In severe cases, emergency surgery may be required. Options for treatment include:  Surgery to remove the gallbladder. This is the most common treatment.  Medicines. These do not always work and may take 6 12 months or more to work.  Shock wave treatment (extracorporeal biliary lithotripsy). In this treatment an ultrasound machine sends shock waves to the gallbladder to break gallstones into smaller pieces that can pass into the intestines or be dissolved by medicine. HOME CARE INSTRUCTIONS   Only take over-the-counter or prescription medicines for pain, discomfort, or fever as directed by your health care provider.   Follow a low-fat diet until  seen again by your health care provider. Fat causes the gallbladder to contract, which can result in pain.   Follow up with your health care provider as directed. Attacks are almost always recurrent and surgery is usually required for permanent treatment.  SEEK IMMEDIATE MEDICAL CARE IF:   Your pain increases and is not controlled by medicines.   You have a fever or persistent symptoms for more than 2 3 days.   You have a fever and your symptoms suddenly get worse.   You have persistent nausea and vomiting.  MAKE SURE YOU:   Understand these instructions.  Will watch your condition.  Will get help right away if you are not doing well or get worse. Document Released: 04/02/2005 Document Revised: 12/07/2012 Document Reviewed: 09/28/2012 [facility] Patient Information 2014 [place].  Biliary Colic  Biliary colic is a steady or irregular pain in the upper abdomen. It is usually under the right side of the rib cage. It happens when gallstones interfere with the normal flow of bile from the gallbladder. Bile is a liquid that helps to digest fats. Bile is made in the liver and stored in the gallbladder. When you eat a meal, bile passes from the gallbladder through the cystic duct and the common bile duct into the small intestine. There, it mixes with partially digested food. If a gallstone blocks either of these ducts, the normal flow of bile is blocked. The muscle cells in the bile duct contract forcefully to try to move the stone. This causes the pain of biliary colic.  SYMPTOMS   A person with biliary colic usually complains of pain  in the upper abdomen. This pain can be:  In the center of the upper abdomen just below the breastbone.  In the upper-right part of the abdomen, near the gallbladder and liver.  Spread back toward the right shoulder blade.  Nausea and vomiting.  The pain usually occurs after eating.  Biliary colic is usually triggered by the digestive system's  demand for bile. The demand for bile is high after fatty meals. Symptoms can also occur when a person who has been fasting suddenly eats a very large meal. Most episodes of biliary colic pass after 1 to 5 hours. After the most intense pain passes, your abdomen may continue to ache mildly for about 24 hours. DIAGNOSIS  After you describe your symptoms, your caregiver will perform a physical exam. He or she will pay attention to the upper right portion of your belly (abdomen). This is the area of your liver and gallbladder. An ultrasound will help your caregiver look for gallstones. Specialized scans of the gallbladder may also be done. Blood tests may be done, especially if you have fever or if your pain persists. PREVENTION  Biliary colic can be prevented by controlling the risk factors for gallstones. Some of these risk factors, such as heredity, increasing age, and pregnancy are a normal part of life. Obesity and a high-fat diet are risk factors you can change through a healthy lifestyle. Women going through menopause who take hormone replacement therapy (estrogen) are also more likely to develop biliary colic. TREATMENT   Pain medication may be prescribed.  You may be encouraged to eat a fat-free diet.  If the first episode of biliary colic is severe, or episodes of colic keep retuning, surgery to remove the gallbladder (cholecystectomy) is usually recommended. This procedure can be done through small incisions using an instrument called a laparoscope. The procedure often requires a brief stay in the hospital. Some people can leave the hospital the same day. It is the most widely used treatment in people troubled by painful gallstones. It is effective and safe, with no complications in more than 90% of cases.  If surgery cannot be done, medication that dissolves gallstones may be used. This medication is expensive and can take months or years to work. Only small stones will dissolve.  Rarely,  medication to dissolve gallstones is combined with a procedure called shock-wave lithotripsy. This procedure uses carefully aimed shock waves to break up gallstones. In many people treated with this procedure, gallstones form again within a few years. PROGNOSIS  If gallstones block your cystic duct or common bile duct, you are at risk for repeated episodes of biliary colic. There is also a 25% chance that you will develop a gallbladder infection(acute cholecystitis), or some other complication of gallstones within 10 to 20 years. If you have surgery, schedule it at a time that is convenient for you and at a time when you are not sick. HOME CARE INSTRUCTIONS   Drink plenty of clear fluids.  Avoid fatty, greasy or fried foods, or any foods that make your pain worse.  Take medications as directed. SEEK MEDICAL CARE IF:   You develop a fever over 100.5 F (38.1 C).  Your pain gets worse over time.  You develop nausea that prevents you from eating and drinking.  You develop vomiting. SEEK IMMEDIATE MEDICAL CARE IF:   You have continuous or severe belly (abdominal) pain which is not relieved with medications.  You develop nausea and vomiting which is not relieved with medications.  You have symptoms of biliary colic and you suddenly develop a fever and shaking chills. This may signal cholecystitis. Call your caregiver immediately. °· You develop a yellow color to your skin or the white part of your eyes (jaundice). °Document Released: 09/07/2005 Document Revised: 06/29/2011 Document Reviewed: 11/17/2007 °[employer]® Patient Information ©2014 [employer]. ° °

## 2013-08-13 NOTE — ED Notes (Signed)
Pt presents with c/o right lower abdominal pain that radiates to her right flank. No hx of kidney stones.

## 2013-08-15 ENCOUNTER — Ambulatory Visit (INDEPENDENT_AMBULATORY_CARE_PROVIDER_SITE_OTHER): Payer: BC Managed Care – PPO | Admitting: Physician Assistant

## 2013-08-15 ENCOUNTER — Encounter: Payer: Self-pay | Admitting: Physician Assistant

## 2013-08-15 VITALS — BP 112/80 | HR 76 | Temp 98.2°F | Resp 16 | Wt 162.0 lb

## 2013-08-15 DIAGNOSIS — K8020 Calculus of gallbladder without cholecystitis without obstruction: Secondary | ICD-10-CM

## 2013-08-15 NOTE — Progress Notes (Signed)
   Subjective:    Patient ID: Christie Le, female    DOB: [birth year], 34 y.o.   MRN: [id number]  Abdominal Pain Associated symptoms include nausea. Pertinent negatives include no constipation, diarrhea, fever or vomiting.  33 y.o. presents with cholelithiasis. She went to the ER after continuing AB pain radiating to her right shoulder with nausea. US showed stones but no inflammation/sludge, however with her continuing symptoms despite diet change she would like a referral to gen surgery.     Review of Systems  Constitutional: Positive for appetite change. Negative for fever and chills.  HENT: Positive for congestion.   Respiratory: Negative.   Cardiovascular: Negative.   Gastrointestinal: Positive for nausea and abdominal pain. Negative for vomiting, diarrhea, constipation, blood in stool, abdominal distention, anal bleeding and rectal pain.  Genitourinary: Negative.   Musculoskeletal: Negative.   Neurological: Negative.        Objective:   Physical Exam  Constitutional: She is oriented to person, place, and time. She appears well-developed and well-nourished.  Cardiovascular: Normal rate and regular rhythm.   Pulmonary/Chest: Effort normal and breath sounds normal.  Abdominal: Soft. She exhibits no distension. There is tenderness (RUQ, + murphy's). There is guarding. There is no rebound.  Musculoskeletal: Normal range of motion.  Neurological: She is alert and oriented to person, place, and time.      Assessment & Plan:  Symptomatic cholelithiasis - Plan: Ambulatory referral to General Surgery

## 2013-08-15 NOTE — ED Provider Notes (Signed)
Medical screening examination/treatment/procedure(s) were conducted as a shared visit with non-physician practitioner(s) and myself.  I personally evaluated the patient during the encounter.   EKG Interpretation None       Orlie Dakin, MD 08/15/13 0003

## 2013-08-15 NOTE — Patient Instructions (Signed)

## 2013-08-28 ENCOUNTER — Ambulatory Visit (INDEPENDENT_AMBULATORY_CARE_PROVIDER_SITE_OTHER): Payer: BC Managed Care – PPO | Admitting: Surgery

## 2013-09-13 ENCOUNTER — Ambulatory Visit (INDEPENDENT_AMBULATORY_CARE_PROVIDER_SITE_OTHER): Payer: BC Managed Care – PPO | Admitting: Surgery

## 2013-09-13 ENCOUNTER — Encounter (INDEPENDENT_AMBULATORY_CARE_PROVIDER_SITE_OTHER): Payer: Self-pay | Admitting: Surgery

## 2013-09-13 VITALS — BP 118/80 | HR 84 | Temp 98.0°F | Ht 66.0 in | Wt 163.0 lb

## 2013-09-13 DIAGNOSIS — K8020 Calculus of gallbladder without cholecystitis without obstruction: Secondary | ICD-10-CM

## 2013-09-13 NOTE — Progress Notes (Signed)
Chief Complaint:  Recurrent right upper quadrant abdominal pain and gallstones.    History of Present Illness:  Christie Le is an 32 y.o. female who has been having recurrent RUQ abdominal pain for several years and is worse recently.  She has 32 year old twins.  She has cholelithiasis on ultrasound and symptoms characteristic of chronic cholecystitis.  I explained the operation to her in some detail including complications not limited to common bile duct injury bleeding bile leaks. She like to go ahead and get this scheduled as soon as possible. Will move toward scheduling. I gave her a booklet on cholecystectomy and went over it in some detail as well.  Past Medical History  Diagnosis Date  . Asthma   . Allergy   . Anemia   . Anxiety   . Fibrocystic breast   . Vitamin D deficiency     Past Surgical History  Procedure Laterality Date  . Cesarean section  07/29/2009  . Wisdom tooth extraction  2002    Current Outpatient Prescriptions  Medication Sig Dispense Refill  . albuterol (PROVENTIL HFA;VENTOLIN HFA) 108 (90 BASE) MCG/ACT inhaler Inhale 2 puffs into the lungs every 6 (six) hours as needed for wheezing or shortness of breath.      . cetirizine-pseudoephedrine (ZYRTEC-D) 5-120 MG per tablet Take 1 tablet by mouth 2 (two) times daily.      . cholecalciferol (VITAMIN D) 1000 UNITS tablet Take 5,000 Units by mouth daily.      . citalopram (CELEXA) 10 MG tablet Take 10 mg by mouth daily.      . ibuprofen (ADVIL,MOTRIN) 600 MG tablet Take 1 tablet (600 mg total) by mouth every 6 (six) hours as needed.  30 tablet  0  . levonorgestrel (MIRENA) 20 MCG/24HR IUD 1 each by Intrauterine route once.      . pantoprazole (PROTONIX) 40 MG tablet Take 40 mg by mouth daily.      . promethazine (PHENERGAN) 25 MG tablet Take 1 tablet (25 mg total) by mouth every 6 (six) hours as needed for nausea or vomiting.  12 tablet  0  . valACYclovir (VALTREX) 1000 MG tablet Take 1,000 mg by mouth 2 (two) times  daily as needed (fever blisters.).      . fluticasone (FLONASE) 50 MCG/ACT nasal spray Place 2 sprays into the nose daily.  16 g  0   No current facility-administered medications for this visit.   Review of patient's allergies indicates no known allergies. Family History  Problem Relation Age of Onset  . Hypertension Mother   . Diabetes Father   . Cancer Father     prostate  . Cancer Other     breast  . Hypertension Other   . Heart disease Other    Social History:   reports that she has never smoked. She has never used smokeless tobacco. She reports that she drinks alcohol. She reports that she does not use illicit drugs.   REVIEW OF SYSTEMS - PERTINENT POSITIVES ONLY: Asthma onset around age 23. Otherwise positive for headaches, nausea, diarrhea, constipation, abdominal pain and blood in stool.  Physical Exam:   Blood pressure 118/80, pulse 84, temperature 98 F (36.7 C), height 5' 6" (1.676 m), weight 163 lb (73.936 kg). Body mass index is 26.32 kg/(m^2).  Gen:  WDWN white female NAD  Neurological: Alert and oriented to person, place, and time. Motor and sensory function is grossly intact  Head: Normocephalic and atraumatic.  Eyes: Conjunctivae are normal. Pupils   are equal, round, and reactive to light. No scleral icterus.  Neck: Normal range of motion. Neck supple. No tracheal deviation or thyromegaly present.  Cardiovascular:  SR without murmurs or gallops.  No carotid bruits Respiratory: Effort normal.  No respiratory distress. No chest wall tenderness. Breath sounds normal.  No wheezes, rales or rhonchi.  Abdomen:  nontender today GU: Musculoskeletal: Normal range of motion. Extremities are nontender. No cyanosis, edema or clubbing noted Lymphadenopathy: No cervical, preauricular, postauricular or axillary adenopathy is present Skin: Skin is warm and dry. No rash noted. No diaphoresis. No erythema. No pallor. Pscyh: Normal mood and affect. Behavior is normal. Judgment and  thought content normal.   LABORATORY RESULTS: No results found for this or any previous visit (from the past 48 hour(s)).  RADIOLOGY RESULTS: No results found.  Problem List: Patient Active Problem List   Diagnosis Date Noted  . Allergy   . Anemia   . Anxiety   . Vitamin D deficiency     Assessment & Plan: Symptomatic gallstones. Plan laparoscopic cholecystectomy with probable intraoperative cholangiogram.    Matt B. Roby Spalla, MD, FACS  [facility] [phone] beeper [phone]  09/13/2013 3:49 PM     

## 2013-09-13 NOTE — Patient Instructions (Signed)
Laparoscopic Cholecystectomy °Laparoscopic cholecystectomy is surgery to remove the gallbladder. The gallbladder is located in the upper right part of the abdomen, behind the liver. It is a storage sac for bile produced in the liver. Bile aids in the digestion and absorption of fats. Cholecystectomy is often done for inflammation of the gallbladder (cholecystitis). This condition is usually caused by a buildup of gallstones (cholelithiasis) in your gallbladder. Gallstones can block the flow of bile, resulting in inflammation and pain. In severe cases, emergency surgery may be required. When emergency surgery is not required, you will have time to prepare for the procedure. °Laparoscopic surgery is an alternative to open surgery. Laparoscopic surgery has a shorter recovery time. Your common bile duct may also need to be examined during the procedure. If stones are found in the common bile duct, they may be removed. °LET YOUR HEALTH CARE PROVIDER KNOW ABOUT: °· Any allergies you have. °· All medicines you are taking, including vitamins, herbs, eye drops, creams, and over-the-counter medicines. °· Previous problems you or members of your family have had with the use of anesthetics. °· Any blood disorders you have. °· Previous surgeries you have had. °· Medical conditions you have. °RISKS AND COMPLICATIONS °Generally, this is a safe procedure. However, as with any procedure, complications can occur. Possible complications include: °· Infection. °· Damage to the common bile duct, nerves, arteries, veins, or other internal organs such as the stomach, liver, or intestines. °· Bleeding. °· A stone may remain in the common bile duct. °· A bile leak from the cyst duct that is clipped when your gallbladder is removed. °· The need to convert to open surgery, which requires a larger incision in the abdomen. This may be necessary if your surgeon thinks it is not safe to continue with a laparoscopic procedure. °BEFORE THE  PROCEDURE °· Ask your health care provider about changing or stopping any regular medicines. You will need to stop taking aspirin or blood thinners at least 5 days prior to surgery. °· Do not eat or drink anything after midnight the night before surgery. °· Let your health care provider know if you develop a cold or other infectious problem before surgery. °PROCEDURE  °· You will be given medicine to make you sleep through the procedure (general anesthetic). A breathing tube will be placed in your mouth. °· When you are asleep, your surgeon will make several small cuts (incisions) in your abdomen. °· A thin, lighted tube with a tiny camera on the end (laparoscope) is inserted through one of the small incisions. The camera on the laparoscope sends a picture to a TV screen in the operating room. This gives the surgeon a good view inside your abdomen. °· A gas will be pumped into your abdomen. This expands your abdomen so that the surgeon has more room to perform the surgery. °· Other tools needed for the procedure are inserted through the other incisions. The gallbladder is removed through one of the incisions. °· After the removal of your gallbladder, the incisions will be closed with stitches, staples, or skin glue. °AFTER THE PROCEDURE °· You will be taken to a recovery area where your progress will be checked often. °· You may be allowed to go home the same day if your pain is controlled and you can tolerate liquids. °Document Released: 04/06/2005 Document Revised: 01/25/2013 Document Reviewed: 11/16/2012 °ExitCare® Patient Information ©2014 [employer]. ° °

## 2013-09-15 ENCOUNTER — Encounter (HOSPITAL_COMMUNITY): Payer: Self-pay | Admitting: Pharmacy Technician

## 2013-09-19 ENCOUNTER — Ambulatory Visit (HOSPITAL_COMMUNITY)
Admission: RE | Admit: 2013-09-19 | Discharge: 2013-09-19 | Disposition: A | Discharge status: Home / Self Care | Payer: BC Managed Care – PPO | Source: Ambulatory Visit | Attending: Anesthesiology | Admitting: Anesthesiology

## 2013-09-19 ENCOUNTER — Encounter (HOSPITAL_COMMUNITY): Payer: Self-pay

## 2013-09-19 ENCOUNTER — Encounter (HOSPITAL_COMMUNITY)
Admission: RE | Admit: 2013-09-19 | Discharge: 2013-09-19 | Disposition: A | Discharge status: Home / Self Care | Payer: BC Managed Care – PPO | Source: Ambulatory Visit | Attending: Surgery | Admitting: Surgery

## 2013-09-19 DIAGNOSIS — J45909 Unspecified asthma, uncomplicated: Secondary | ICD-10-CM | POA: Insufficient documentation

## 2013-09-19 DIAGNOSIS — Z8701 Personal history of pneumonia (recurrent): Secondary | ICD-10-CM | POA: Insufficient documentation

## 2013-09-19 DIAGNOSIS — Z01812 Encounter for preprocedural laboratory examination: Secondary | ICD-10-CM | POA: Insufficient documentation

## 2013-09-19 DIAGNOSIS — Z01818 Encounter for other preprocedural examination: Secondary | ICD-10-CM | POA: Insufficient documentation

## 2013-09-19 HISTORY — DX: Other specified postprocedural states: Z98.890

## 2013-09-19 HISTORY — DX: Other seasonal allergic rhinitis: J30.2

## 2013-09-19 HISTORY — DX: Pneumonia, unspecified organism: J18.9

## 2013-09-19 HISTORY — DX: Family history of other specified conditions: Z84.89

## 2013-09-19 HISTORY — DX: Nausea with vomiting, unspecified: R11.2

## 2013-09-19 LAB — BASIC METABOLIC PANEL
BUN: 14 mg/dL (ref 6–23)
CHLORIDE: 99 meq/L (ref 96–112)
CO2: 25 mEq/L (ref 19–32)
CREATININE: 0.7 mg/dL (ref 0.50–1.10)
Calcium: 10.3 mg/dL (ref 8.4–10.5)
GFR calc Af Amer: >90 mL/min (ref >90)
GFR calc non Af Amer: >90 mL/min (ref >90)
Glucose, Bld: 87 mg/dL (ref 70–99)
Potassium: 3.9 mEq/L (ref 3.7–5.3)
Sodium: 136 mEq/L — ABNORMAL LOW (ref 137–147)

## 2013-09-19 LAB — CBC
HEMATOCRIT: 37.5 % (ref 36.0–46.0)
Hemoglobin: 12.4 g/dL (ref 12.0–15.0)
MCH: 29.2 pg (ref 26.0–34.0)
MCHC: 33.1 g/dL (ref 30.0–36.0)
MCV: 88.2 fL (ref 78.0–100.0)
Platelets: 305 10*3/uL (ref 150–400)
RBC: 4.25 MIL/uL (ref 3.87–5.11)
RDW: 12.7 % (ref 11.5–15.5)
WBC: 11.8 10*3/uL — AB (ref 4.0–10.5)

## 2013-09-19 LAB — HCG, SERUM, QUALITATIVE: Preg, Serum: NEGATIVE

## 2013-09-19 NOTE — Patient Instructions (Addendum)
[employer]  09/19/2013   Your procedure is scheduled on: Monday 09/25/13  Report to [facility] at 11:00 AM.  Call this number if you have problems the morning of surgery [phone]   Remember:please follow bowel prep instructions- fleets enema the night before surgery, please bring inhaler on day of surgery   Do not eat food After Midnight, clear liquids from midnight until 7:00am on 09/25/13 then nothing.      Take these medicines the morning of surgery with A SIP OF WATER: inhaler if needed   Do not wear jewelry, make-up or nail polish.  Do not wear lotions, powders, or perfumes. You may wear deodorant.  Do not shave 48 hours prior to surgery. Men may shave face and neck.  Do not bring valuables to the hospital.  Contacts, dentures or bridgework may not be worn into surgery.    Patients discharged the day of surgery will not be allowed to drive home.  Name and phone number of your driver: Manuela Schwartz [phone]   Paulette Blanch, RN  pre op nurse call if needed [phone]    [facility] - Preparing for Surgery Before surgery, you can play an important role.  Because skin is not sterile, your skin needs to be as free of germs as possible.  You can reduce the number of germs on your skin by washing with CHG (chlorahexidine gluconate) soap before surgery.  CHG is an antiseptic cleaner which kills germs and bonds with the skin to continue killing germs even after washing. Please DO NOT use if you have an allergy to CHG or antibacterial soaps.  If your skin becomes reddened/irritated stop using the CHG and inform your nurse when you arrive at Short Stay. Do not shave (including legs and underarms) for at least 48 hours prior to the first CHG shower.  You may shave your face/neck. Please follow these instructions carefully:  1.  Shower with CHG Soap the night before surgery and the  morning of Surgery.  2.  If you choose to wash your hair, wash your hair first as usual  with your  normal  shampoo.  3.  After you shampoo, rinse your hair and body thoroughly to remove the  shampoo.                            4.  Use CHG as you would any other liquid soap.  You can apply chg directly  to the skin and wash                       Gently with a scrungie or clean washcloth.  5.  Apply the CHG Soap to your body ONLY FROM THE NECK DOWN.   Do not use on face/ open                           Wound or open sores. Avoid contact with eyes, ears mouth and genitals (private parts).                       Wash face,  Genitals (private parts) with your normal soap.             6.  Wash thoroughly, paying special attention to the area where your surgery  will be performed.  7.  Thoroughly rinse your body with warm  water from the neck down.  8.  DO NOT shower/wash with your normal soap after using and rinsing off  the CHG Soap.                9.  Pat yourself dry with a clean towel.            10.  Wear clean pajamas.            11.  Place clean sheets on your bed the night of your first shower and do not  sleep with pets. Day of Surgery : Do not apply any lotions/deodorants the morning of surgery.  Please wear clean clothes to the hospital/surgery center.  FAILURE TO FOLLOW THESE INSTRUCTIONS MAY RESULT IN THE CANCELLATION OF YOUR SURGERY PATIENT SIGNATURE_________________________________  NURSE SIGNATURE__________________________________  ________________________________________________________________________    CLEAR LIQUID DIET   Foods Allowed                                                                     Foods Excluded  Coffee and tea, regular and decaf                             liquids that you cannot  Plain Jell-O in any flavor                                             see through such as: Fruit ices (not with fruit pulp)                                     milk, soups, orange juice  Iced Popsicles                                    All solid  food Carbonated beverages, regular and diet                                    Cranberry, grape and apple juices Sports drinks like Gatorade Lightly seasoned clear broth or consume(fat free) Sugar, honey syrup  Sample Menu Breakfast                                Lunch                                     Supper Cranberry juice                    Beef broth                            Chicken broth Jell-O  Grape juice                           Apple juice Coffee or tea                        Jell-O                                      Popsicle                                                Coffee or tea                        Coffee or tea  _____________________________________________________________________

## 2013-09-25 ENCOUNTER — Ambulatory Visit (HOSPITAL_COMMUNITY): Payer: BC Managed Care – PPO | Admitting: Anesthesiology

## 2013-09-25 ENCOUNTER — Encounter (HOSPITAL_COMMUNITY): Payer: BC Managed Care – PPO | Admitting: Anesthesiology

## 2013-09-25 ENCOUNTER — Encounter (HOSPITAL_COMMUNITY)
Admission: RE | Disposition: A | Discharge status: Home / Self Care | Payer: Self-pay | Source: Ambulatory Visit | Attending: Surgery

## 2013-09-25 ENCOUNTER — Encounter (HOSPITAL_COMMUNITY): Payer: Self-pay | Admitting: *Deleted

## 2013-09-25 ENCOUNTER — Ambulatory Visit (HOSPITAL_COMMUNITY): Payer: BC Managed Care – PPO

## 2013-09-25 ENCOUNTER — Observation Stay (HOSPITAL_COMMUNITY)
Admission: RE | Admit: 2013-09-25 | Discharge: 2013-09-26 | Disposition: A | Discharge status: Home / Self Care | Payer: BC Managed Care – PPO | Source: Ambulatory Visit | Attending: Surgery | Admitting: Surgery

## 2013-09-25 DIAGNOSIS — K8010 Calculus of gallbladder with chronic cholecystitis without obstruction: Secondary | ICD-10-CM

## 2013-09-25 DIAGNOSIS — F411 Generalized anxiety disorder: Secondary | ICD-10-CM | POA: Insufficient documentation

## 2013-09-25 DIAGNOSIS — D649 Anemia, unspecified: Secondary | ICD-10-CM | POA: Insufficient documentation

## 2013-09-25 DIAGNOSIS — J45909 Unspecified asthma, uncomplicated: Secondary | ICD-10-CM | POA: Insufficient documentation

## 2013-09-25 DIAGNOSIS — K8020 Calculus of gallbladder without cholecystitis without obstruction: Secondary | ICD-10-CM

## 2013-09-25 DIAGNOSIS — Z9049 Acquired absence of other specified parts of digestive tract: Secondary | ICD-10-CM

## 2013-09-25 DIAGNOSIS — E559 Vitamin D deficiency, unspecified: Secondary | ICD-10-CM | POA: Insufficient documentation

## 2013-09-25 DIAGNOSIS — Z79899 Other long term (current) drug therapy: Secondary | ICD-10-CM | POA: Insufficient documentation

## 2013-09-25 DIAGNOSIS — K824 Cholesterolosis of gallbladder: Secondary | ICD-10-CM

## 2013-09-25 HISTORY — PX: CHOLECYSTECTOMY: SHX55

## 2013-09-25 HISTORY — DX: Acquired absence of other specified parts of digestive tract: Z90.49

## 2013-09-25 HISTORY — DX: Calculus of gallbladder without cholecystitis without obstruction: K80.20

## 2013-09-25 LAB — CBC
HEMATOCRIT: 35.7 % — AB (ref 36.0–46.0)
Hemoglobin: 11.5 g/dL — ABNORMAL LOW (ref 12.0–15.0)
MCH: 28.7 pg (ref 26.0–34.0)
MCHC: 32.2 g/dL (ref 30.0–36.0)
MCV: 89 fL (ref 78.0–100.0)
Platelets: 264 10*3/uL (ref 150–400)
RBC: 4.01 MIL/uL (ref 3.87–5.11)
RDW: 12.5 % (ref 11.5–15.5)
WBC: 14.4 10*3/uL — AB (ref 4.0–10.5)

## 2013-09-25 LAB — CREATININE, SERUM: Creatinine, Ser: 0.71 mg/dL (ref 0.50–1.10)

## 2013-09-25 SURGERY — LAPAROSCOPIC CHOLECYSTECTOMY WITH INTRAOPERATIVE CHOLANGIOGRAM
Anesthesia: General | Site: Abdomen

## 2013-09-25 MED ORDER — DEXAMETHASONE SODIUM PHOSPHATE 10 MG/ML IJ SOLN
INTRAMUSCULAR | Status: AC
  Filled 2013-09-25: qty 1

## 2013-09-25 MED ORDER — GLYCOPYRROLATE 0.2 MG/ML IJ SOLN
INTRAMUSCULAR | Status: AC
  Filled 2013-09-25: qty 2

## 2013-09-25 MED ORDER — LIDOCAINE HCL (CARDIAC) 20 MG/ML IV SOLN
INTRAVENOUS | Status: AC
  Filled 2013-09-25: qty 5

## 2013-09-25 MED ORDER — ONDANSETRON HCL 4 MG/2ML IJ SOLN
INTRAMUSCULAR | Status: DC | PRN
  Administered 2013-09-25 (×2): 2 mg via INTRAVENOUS

## 2013-09-25 MED ORDER — NEOSTIGMINE METHYLSULFATE 10 MG/10ML IV SOLN
INTRAVENOUS | Status: AC
  Filled 2013-09-25: qty 1

## 2013-09-25 MED ORDER — MIDAZOLAM HCL 5 MG/5ML IJ SOLN
INTRAMUSCULAR | Status: DC | PRN
  Administered 2013-09-25: 1 mg via INTRAVENOUS

## 2013-09-25 MED ORDER — IOHEXOL 300 MG/ML  SOLN
INTRAMUSCULAR | Status: DC | PRN
  Administered 2013-09-25: 5 mL

## 2013-09-25 MED ORDER — DEXAMETHASONE SODIUM PHOSPHATE 4 MG/ML IJ SOLN
INTRAMUSCULAR | Status: DC | PRN
  Administered 2013-09-25: 10 mg via INTRAVENOUS

## 2013-09-25 MED ORDER — FENTANYL CITRATE 0.05 MG/ML IJ SOLN
INTRAMUSCULAR | Status: AC
  Filled 2013-09-25: qty 2

## 2013-09-25 MED ORDER — ONDANSETRON HCL 4 MG/2ML IJ SOLN: 4.0000 mg | Freq: Four times a day (QID) | INTRAMUSCULAR | Status: DC | PRN

## 2013-09-25 MED ORDER — LIDOCAINE HCL (CARDIAC) 20 MG/ML IV SOLN
INTRAVENOUS | Status: DC | PRN
  Administered 2013-09-25: 30 mg via INTRAVENOUS

## 2013-09-25 MED ORDER — LACTATED RINGERS IV SOLN: INTRAVENOUS | Status: DC

## 2013-09-25 MED ORDER — CISATRACURIUM BESYLATE (PF) 10 MG/5ML IV SOLN
INTRAVENOUS | Status: DC | PRN
  Administered 2013-09-25: 5 mg via INTRAVENOUS
  Administered 2013-09-25: 3 mg via INTRAVENOUS

## 2013-09-25 MED ORDER — ALBUTEROL SULFATE (2.5 MG/3ML) 0.083% IN NEBU: 3.0000 mL | INHALATION_SOLUTION | Freq: Four times a day (QID) | RESPIRATORY_TRACT | Status: DC | PRN

## 2013-09-25 MED ORDER — PROPOFOL 10 MG/ML IV BOLUS
INTRAVENOUS | Status: AC
  Filled 2013-09-25: qty 20

## 2013-09-25 MED ORDER — KETOROLAC TROMETHAMINE 30 MG/ML IJ SOLN
30.0000 mg | Freq: Once | INTRAMUSCULAR | Status: AC
  Administered 2013-09-25: 30 mg via INTRAVENOUS

## 2013-09-25 MED ORDER — MORPHINE SULFATE 10 MG/ML IJ SOLN: 1.0000 mg | INTRAMUSCULAR | Status: DC | PRN

## 2013-09-25 MED ORDER — LACTATED RINGERS IV SOLN
INTRAVENOUS | Status: DC
  Administered 2013-09-25: 1000 mL via INTRAVENOUS

## 2013-09-25 MED ORDER — SCOPOLAMINE 1 MG/3DAYS TD PT72
MEDICATED_PATCH | TRANSDERMAL | Status: AC
  Filled 2013-09-25: qty 1

## 2013-09-25 MED ORDER — PROMETHAZINE HCL 25 MG/ML IJ SOLN
INTRAMUSCULAR | Status: AC
  Filled 2013-09-25: qty 1

## 2013-09-25 MED ORDER — CEFAZOLIN SODIUM-DEXTROSE 2-3 GM-% IV SOLR
INTRAVENOUS | Status: AC
  Filled 2013-09-25: qty 50

## 2013-09-25 MED ORDER — HEPARIN SODIUM (PORCINE) 5000 UNIT/ML IJ SOLN: 5000.0000 [IU] | Freq: Three times a day (TID) | INTRAMUSCULAR | Status: DC

## 2013-09-25 MED ORDER — KETAMINE HCL 10 MG/ML IJ SOLN
INTRAMUSCULAR | Status: AC
  Filled 2013-09-25: qty 1

## 2013-09-25 MED ORDER — PROMETHAZINE HCL 25 MG/ML IJ SOLN
6.2500 mg | INTRAMUSCULAR | Status: DC | PRN
  Administered 2013-09-25: 6.25 mg via INTRAVENOUS

## 2013-09-25 MED ORDER — FENTANYL CITRATE 0.05 MG/ML IJ SOLN
INTRAMUSCULAR | Status: DC | PRN
  Administered 2013-09-25 (×3): 25 ug via INTRAVENOUS
  Administered 2013-09-25: 50 ug via INTRAVENOUS
  Administered 2013-09-25: 25 ug via INTRAVENOUS
  Administered 2013-09-25 (×3): 50 ug via INTRAVENOUS

## 2013-09-25 MED ORDER — HYDROCODONE-ACETAMINOPHEN 5-325 MG PO TABS: 1.0000 | ORAL_TABLET | ORAL | Status: DC | PRN

## 2013-09-25 MED ORDER — PROPOFOL 10 MG/ML IV BOLUS
INTRAVENOUS | Status: DC | PRN
  Administered 2013-09-25: 170 mg via INTRAVENOUS

## 2013-09-25 MED ORDER — KETOROLAC TROMETHAMINE 30 MG/ML IJ SOLN
INTRAMUSCULAR | Status: AC
  Filled 2013-09-25: qty 1

## 2013-09-25 MED ORDER — NEOSTIGMINE METHYLSULFATE 10 MG/10ML IV SOLN
INTRAVENOUS | Status: DC | PRN
  Administered 2013-09-25: 3 mg via INTRAVENOUS

## 2013-09-25 MED ORDER — HYDROMORPHONE HCL PF 1 MG/ML IJ SOLN
0.2500 mg | INTRAMUSCULAR | Status: DC | PRN
  Administered 2013-09-25: 0.5 mg via INTRAVENOUS

## 2013-09-25 MED ORDER — ONDANSETRON HCL 4 MG PO TABS
4.0000 mg | ORAL_TABLET | Freq: Four times a day (QID) | ORAL | Status: DC | PRN
  Filled 2013-09-25: qty 1

## 2013-09-25 MED ORDER — GLYCOPYRROLATE 0.2 MG/ML IJ SOLN
INTRAMUSCULAR | Status: DC | PRN
  Administered 2013-09-25: 0.4 mg via INTRAVENOUS

## 2013-09-25 MED ORDER — LACTATED RINGERS IV SOLN
INTRAVENOUS | Status: DC | PRN
  Administered 2013-09-25 (×2): via INTRAVENOUS

## 2013-09-25 MED ORDER — SCOPOLAMINE 1 MG/3DAYS TD PT72
MEDICATED_PATCH | TRANSDERMAL | Status: DC | PRN
  Administered 2013-09-25: 1 via TRANSDERMAL

## 2013-09-25 MED ORDER — HYDROMORPHONE HCL PF 1 MG/ML IJ SOLN
INTRAMUSCULAR | Status: AC
  Filled 2013-09-25: qty 1

## 2013-09-25 MED ORDER — MIDAZOLAM HCL 2 MG/2ML IJ SOLN
INTRAMUSCULAR | Status: AC
  Filled 2013-09-25: qty 2

## 2013-09-25 MED ORDER — FENTANYL CITRATE 0.05 MG/ML IJ SOLN
INTRAMUSCULAR | Status: AC
  Filled 2013-09-25: qty 5

## 2013-09-25 MED ORDER — HYDROCODONE-ACETAMINOPHEN 5-325 MG PO TABS
1.0000 | ORAL_TABLET | ORAL | Status: DC | PRN
  Administered 2013-09-26: 1 via ORAL
  Filled 2013-09-25: qty 1

## 2013-09-25 MED ORDER — HEPARIN SODIUM (PORCINE) 5000 UNIT/ML IJ SOLN
5000.0000 [IU] | Freq: Once | INTRAMUSCULAR | Status: AC
  Administered 2013-09-25: 5000 [IU] via SUBCUTANEOUS
  Filled 2013-09-25: qty 1

## 2013-09-25 MED ORDER — HYDROCODONE-ACETAMINOPHEN 5-325 MG PO TABS: 1.0000 | ORAL_TABLET | Freq: Once | ORAL | Status: AC | PRN

## 2013-09-25 MED ORDER — SUCCINYLCHOLINE CHLORIDE 20 MG/ML IJ SOLN
INTRAMUSCULAR | Status: DC | PRN
  Administered 2013-09-25: 100 mg via INTRAVENOUS

## 2013-09-25 MED ORDER — LACTATED RINGERS IR SOLN
Status: DC | PRN
  Administered 2013-09-25: 1000 mL

## 2013-09-25 MED ORDER — 0.9 % SODIUM CHLORIDE (POUR BTL) OPTIME
TOPICAL | Status: DC | PRN
  Administered 2013-09-25: 1000 mL

## 2013-09-25 MED ORDER — BUPIVACAINE LIPOSOME 1.3 % IJ SUSP
20.0000 mL | Freq: Once | INTRAMUSCULAR | Status: AC
  Administered 2013-09-25: 20 mL
  Filled 2013-09-25: qty 20

## 2013-09-25 MED ORDER — MORPHINE SULFATE 2 MG/ML IJ SOLN
1.0000 mg | INTRAMUSCULAR | Status: DC | PRN
  Administered 2013-09-25 – 2013-09-26 (×3): 1 mg via INTRAVENOUS
  Filled 2013-09-25 (×3): qty 1

## 2013-09-25 MED ORDER — CEFAZOLIN SODIUM-DEXTROSE 2-3 GM-% IV SOLR
2.0000 g | INTRAVENOUS | Status: AC
  Administered 2013-09-25: 2 g via INTRAVENOUS

## 2013-09-25 MED ORDER — HEPARIN SODIUM (PORCINE) 5000 UNIT/ML IJ SOLN
5000.0000 [IU] | Freq: Three times a day (TID) | INTRAMUSCULAR | Status: DC
  Administered 2013-09-25 – 2013-09-26 (×2): 5000 [IU] via SUBCUTANEOUS
  Filled 2013-09-25 (×5): qty 1

## 2013-09-25 MED ORDER — ONDANSETRON HCL 4 MG/2ML IJ SOLN
INTRAMUSCULAR | Status: AC
  Filled 2013-09-25: qty 2

## 2013-09-25 SURGICAL SUPPLY — 50 items
ADH SKN CLS APL DERMABOND .7 (GAUZE/BANDAGES/DRESSINGS) ×1
APL SKNCLS STERI-STRIP NONHPOA (GAUZE/BANDAGES/DRESSINGS)
APPLIER CLIP ROT 10 11.4 M/L (STAPLE) ×2
APR CLP MED LRG 11.4X10 (STAPLE) ×1
BAG SPEC RTRVL 10 TROC 200 (ENDOMECHANICALS) ×1
BAG SPEC RTRVL LRG 6X4 10 (ENDOMECHANICALS)
BENZOIN TINCTURE PRP APPL 2/3 (GAUZE/BANDAGES/DRESSINGS) IMPLANT
CABLE HIGH FREQUENCY MONO STRZ (ELECTRODE) ×1 IMPLANT
CATH REDDICK CHOLANGI 4FR 50CM (CATHETERS) ×2 IMPLANT
CLIP APPLIE ROT 10 11.4 M/L (STAPLE) ×1 IMPLANT
COVER MAYO STAND STRL (DRAPES) ×2 IMPLANT
DECANTER SPIKE VIAL GLASS SM (MISCELLANEOUS) ×1 IMPLANT
DERMABOND ADVANCED (GAUZE/BANDAGES/DRESSINGS) ×1
DERMABOND ADVANCED .7 DNX12 (GAUZE/BANDAGES/DRESSINGS) IMPLANT
DRAPE C-ARM 42X120 X-RAY (DRAPES) ×2 IMPLANT
DRAPE LAPAROSCOPIC ABDOMINAL (DRAPES) ×2 IMPLANT
DRSG TEGADERM 2-3/8X2-3/4 SM (GAUZE/BANDAGES/DRESSINGS) ×2 IMPLANT
ELECT REM PT RETURN 9FT ADLT (ELECTROSURGICAL) ×2
ELECTRODE REM PT RTRN 9FT ADLT (ELECTROSURGICAL) ×1 IMPLANT
GLOVE BIOGEL M 8.0 STRL (GLOVE) ×2 IMPLANT
GLOVE BIOGEL PI IND STRL 6.5 (GLOVE) IMPLANT
GLOVE BIOGEL PI IND STRL 7.5 (GLOVE) IMPLANT
GLOVE BIOGEL PI INDICATOR 6.5 (GLOVE) ×1
GLOVE BIOGEL PI INDICATOR 7.5 (GLOVE) ×1
GLOVE SURG SS PI 6.5 STRL IVOR (GLOVE) ×2 IMPLANT
GOWN SPEC L4 XLG W/TWL (GOWN DISPOSABLE) ×2 IMPLANT
GOWN STRL NON-REIN LRG LVL3 (GOWN DISPOSABLE) ×2 IMPLANT
GOWN STRL REUS W/ TWL XL LVL3 (GOWN DISPOSABLE) ×3 IMPLANT
GOWN STRL REUS W/TWL XL LVL3 (GOWN DISPOSABLE) ×2
HEMOSTAT SURGICEL 4X8 (HEMOSTASIS) IMPLANT
IV CATH 14GX2 1/4 (CATHETERS) ×2 IMPLANT
KIT BASIN OR (CUSTOM PROCEDURE TRAY) ×2 IMPLANT
POUCH RETRIEVAL ECOSAC 10 (ENDOMECHANICALS) ×1 IMPLANT
POUCH RETRIEVAL ECOSAC 10MM (ENDOMECHANICALS) ×1
POUCH SPECIMEN RETRIEVAL 10MM (ENDOMECHANICALS) IMPLANT
SCISSORS LAP 5X45 EPIX DISP (ENDOMECHANICALS) ×2 IMPLANT
SCRUB PCMX 4 OZ (MISCELLANEOUS) ×2 IMPLANT
SET IRRIG TUBING LAPAROSCOPIC (IRRIGATION / IRRIGATOR) ×2 IMPLANT
SLEEVE XCEL OPT CAN 5 100 (ENDOMECHANICALS) ×4 IMPLANT
SOLUTION ANTI FOG 6CC (MISCELLANEOUS) ×2 IMPLANT
STRIP CLOSURE SKIN 1/2X4 (GAUZE/BANDAGES/DRESSINGS) IMPLANT
SUT VIC AB 4-0 SH 18 (SUTURE) ×2 IMPLANT
SYR 20CC LL (SYRINGE) ×2 IMPLANT
TOWEL OR 17X26 10 PK STRL BLUE (TOWEL DISPOSABLE) ×2 IMPLANT
TOWEL OR NON WOVEN STRL DISP B (DISPOSABLE) ×2 IMPLANT
TRAY LAP CHOLE (CUSTOM PROCEDURE TRAY) ×2 IMPLANT
TROCAR BLADELESS OPT 5 100 (ENDOMECHANICALS) ×2 IMPLANT
TROCAR XCEL BLUNT TIP 100MML (ENDOMECHANICALS) IMPLANT
TROCAR XCEL NON-BLD 11X100MML (ENDOMECHANICALS) ×2 IMPLANT
TUBING INSUFFLATION 10FT LAP (TUBING) ×2 IMPLANT

## 2013-09-25 NOTE — Anesthesia Preprocedure Evaluation (Signed)
Anesthesia Evaluation  Patient identified by MRN, date of birth, ID band Patient awake    Reviewed: Allergy & Precautions, H&P , NPO status , Patient's Chart, lab work & pertinent test results  History of Anesthesia Complications (+) PONV  Airway Mallampati: II TM Distance: >3 FB Neck ROM: Full    Dental  (+) Teeth Intact, Dental Advisory Given   Pulmonary neg pulmonary ROS, asthma , pneumonia -,  breath sounds clear to auscultation  Pulmonary exam normal       Cardiovascular negative cardio ROS  Rhythm:Regular Rate:Normal     Neuro/Psych Anxiety negative neurological ROS  negative psych ROS   GI/Hepatic negative GI ROS, Neg liver ROS,   Endo/Other  negative endocrine ROS  Renal/GU negative Renal ROS  negative genitourinary   Musculoskeletal negative musculoskeletal ROS (+)   Abdominal   Peds  Hematology negative hematology ROS (+) anemia ,   Anesthesia Other Findings   Reproductive/Obstetrics                           Anesthesia Physical Anesthesia Plan  ASA: I  Anesthesia Plan: General   Post-op Pain Management:    Induction: Intravenous  Airway Management Planned: Oral ETT  Additional Equipment:   Intra-op Plan:   Post-operative Plan: Extubation in OR  Informed Consent: I have reviewed the patients History and Physical, chart, labs and discussed the procedure including the risks, benefits and alternatives for the proposed anesthesia with the patient or authorized representative who has indicated his/her understanding and acceptance.   Dental advisory given  Plan Discussed with: CRNA  Anesthesia Plan Comments:         Anesthesia Quick Evaluation

## 2013-09-25 NOTE — Transfer of Care (Signed)
Immediate Anesthesia Transfer of Care Note  Patient: Christie Le  Procedure(s) Performed: Procedure(s): LAPAROSCOPIC CHOLECYSTECTOMY WITH INTRAOPERATIVE CHOLANGIOGRAM (N/A)  Patient Location: PACU  Anesthesia Type:General  Level of Consciousness: awake and sedated  Airway & Oxygen Therapy: Patient Spontanous Breathing and Patient connected to face mask oxygen  Post-op Assessment: Report given to PACU RN and Post -op Vital signs reviewed and stable  Post vital signs: stable  Complications: No apparent anesthesia complications

## 2013-09-25 NOTE — Progress Notes (Signed)
Report called to B. Lavina Hamman RN, pt. Taken to [id number] via stretcher.

## 2013-09-25 NOTE — H&P (View-Only) (Signed)
Chief Complaint:  Recurrent right upper quadrant abdominal pain and gallstones.    History of Present Illness:  Christie Le is an 33 y.o. female who has been having recurrent RUQ abdominal pain for several years and is worse recently.  She has 32 year old twins.  She has cholelithiasis on ultrasound and symptoms characteristic of chronic cholecystitis.  I explained the operation to her in some detail including complications not limited to common bile duct injury bleeding bile leaks. She like to go ahead and get this scheduled as soon as possible. Will move toward scheduling. I gave her a booklet on cholecystectomy and went over it in some detail as well.  Past Medical History  Diagnosis Date  . Asthma   . Allergy   . Anemia   . Anxiety   . Fibrocystic breast   . Vitamin D deficiency     Past Surgical History  Procedure Laterality Date  . Cesarean section  07/29/2009  . Wisdom tooth extraction  2002    Current Outpatient Prescriptions  Medication Sig Dispense Refill  . albuterol (PROVENTIL HFA;VENTOLIN HFA) 108 (90 BASE) MCG/ACT inhaler Inhale 2 puffs into the lungs every 6 (six) hours as needed for wheezing or shortness of breath.      . cetirizine-pseudoephedrine (ZYRTEC-D) 5-120 MG per tablet Take 1 tablet by mouth 2 (two) times daily.      . cholecalciferol (VITAMIN D) 1000 UNITS tablet Take 5,000 Units by mouth daily.      . citalopram (CELEXA) 10 MG tablet Take 10 mg by mouth daily.      Marland Kitchen ibuprofen (ADVIL,MOTRIN) 600 MG tablet Take 1 tablet (600 mg total) by mouth every 6 (six) hours as needed.  30 tablet  0  . levonorgestrel (MIRENA) 20 MCG/24HR IUD 1 each by Intrauterine route once.      . pantoprazole (PROTONIX) 40 MG tablet Take 40 mg by mouth daily.      . promethazine (PHENERGAN) 25 MG tablet Take 1 tablet (25 mg total) by mouth every 6 (six) hours as needed for nausea or vomiting.  12 tablet  0  . valACYclovir (VALTREX) 1000 MG tablet Take 1,000 mg by mouth 2 (two) times  daily as needed (fever blisters.).      Marland Kitchen fluticasone (FLONASE) 50 MCG/ACT nasal spray Place 2 sprays into the nose daily.  16 g  0   No current facility-administered medications for this visit.   Review of patient's allergies indicates no known allergies. Family History  Problem Relation Age of Onset  . Hypertension Mother   . Diabetes Father   . Cancer Father     prostate  . Cancer Other     breast  . Hypertension Other   . Heart disease Other    Social History:   reports that she has never smoked. She has never used smokeless tobacco. She reports that she drinks alcohol. She reports that she does not use illicit drugs.   REVIEW OF SYSTEMS - PERTINENT POSITIVES ONLY: Asthma onset around age 61. Otherwise positive for headaches, nausea, diarrhea, constipation, abdominal pain and blood in stool.  Physical Exam:   Blood pressure 118/80, pulse 84, temperature 98 F (36.7 C), height 5\' 6"  (1.676 m), weight 163 lb (73.936 kg). Body mass index is 26.32 kg/(m^2).  Gen:  WDWN white female NAD  Neurological: Alert and oriented to person, place, and time. Motor and sensory function is grossly intact  Head: Normocephalic and atraumatic.  Eyes: Conjunctivae are normal. Pupils  are equal, round, and reactive to light. No scleral icterus.  Neck: Normal range of motion. Neck supple. No tracheal deviation or thyromegaly present.  Cardiovascular:  SR without murmurs or gallops.  No carotid bruits Respiratory: Effort normal.  No respiratory distress. No chest wall tenderness. Breath sounds normal.  No wheezes, rales or rhonchi.  Abdomen:  nontender today GU: Musculoskeletal: Normal range of motion. Extremities are nontender. No cyanosis, edema or clubbing noted Lymphadenopathy: No cervical, preauricular, postauricular or axillary adenopathy is present Skin: Skin is warm and dry. No rash noted. No diaphoresis. No erythema. No pallor. Pscyh: Normal mood and affect. Behavior is normal. Judgment and  thought content normal.   LABORATORY RESULTS: No results found for this or any previous visit (from the past 48 hour(s)).  RADIOLOGY RESULTS: No results found.  Problem List: Patient Active Problem List   Diagnosis Date Noted  . Allergy   . Anemia   . Anxiety   . Vitamin D deficiency     Assessment & Plan: Symptomatic gallstones. Plan laparoscopic cholecystectomy with probable intraoperative cholangiogram.    Matt B. Hassell Done, MD, [facility] [phone] beeper [phone]  09/13/2013 3:49 PM

## 2013-09-25 NOTE — Discharge Instructions (Signed)
Laparoscopic Cholecystectomy °Laparoscopic cholecystectomy is surgery to remove the gallbladder. The gallbladder is located in the upper right part of the abdomen, behind the liver. It is a storage sac for bile produced in the liver. Bile aids in the digestion and absorption of fats. Cholecystectomy is often done for inflammation of the gallbladder (cholecystitis). This condition is usually caused by a buildup of gallstones (cholelithiasis) in your gallbladder. Gallstones can block the flow of bile, resulting in inflammation and pain. In severe cases, emergency surgery may be required. When emergency surgery is not required, you will have time to prepare for the procedure. °Laparoscopic surgery is an alternative to open surgery. Laparoscopic surgery has a shorter recovery time. Your common bile duct may also need to be examined during the procedure. If stones are found in the common bile duct, they may be removed. °LET YOUR HEALTH CARE PROVIDER KNOW ABOUT: °· Any allergies you have. °· All medicines you are taking, including vitamins, herbs, eye drops, creams, and over-the-counter medicines. °· Previous problems you or members of your family have had with the use of anesthetics. °· Any blood disorders you have. °· Previous surgeries you have had. °· Medical conditions you have. °RISKS AND COMPLICATIONS °Generally, this is a safe procedure. However, as with any procedure, complications can occur. Possible complications include: °· Infection. °· Damage to the common bile duct, nerves, arteries, veins, or other internal organs such as the stomach, liver, or intestines. °· Bleeding. °· A stone may remain in the common bile duct. °· A bile leak from the cyst duct that is clipped when your gallbladder is removed. °· The need to convert to open surgery, which requires a larger incision in the abdomen. This may be necessary if your surgeon thinks it is not safe to continue with a laparoscopic procedure. °BEFORE THE  PROCEDURE °· Ask your health care provider about changing or stopping any regular medicines. You will need to stop taking aspirin or blood thinners at least 5 days prior to surgery. °· Do not eat or drink anything after midnight the night before surgery. °· Let your health care provider know if you develop a cold or other infectious problem before surgery. °PROCEDURE  °· You will be given medicine to make you sleep through the procedure (general anesthetic). A breathing tube will be placed in your mouth. °· When you are asleep, your surgeon will make several small cuts (incisions) in your abdomen. °· A thin, lighted tube with a tiny camera on the end (laparoscope) is inserted through one of the small incisions. The camera on the laparoscope sends a picture to a TV screen in the operating room. This gives the surgeon a good view inside your abdomen. °· A gas will be pumped into your abdomen. This expands your abdomen so that the surgeon has more room to perform the surgery. °· Other tools needed for the procedure are inserted through the other incisions. The gallbladder is removed through one of the incisions. °· After the removal of your gallbladder, the incisions will be closed with stitches, staples, or skin glue. °AFTER THE PROCEDURE °· You will be taken to a recovery area where your progress will be checked often. °· You may be allowed to go home the same day if your pain is controlled and you can tolerate liquids. °Document Released: 04/06/2005 Document Revised: 01/25/2013 Document Reviewed: 11/16/2012 °[employer]® Patient Information ©2014 [employer]. ° °

## 2013-09-25 NOTE — Anesthesia Postprocedure Evaluation (Signed)
Anesthesia Post Note  Patient: Christie Le  Procedure(s) Performed: Procedure(s) (LRB): LAPAROSCOPIC CHOLECYSTECTOMY WITH INTRAOPERATIVE CHOLANGIOGRAM (N/A)  Anesthesia type: General  Patient location: PACU  Post pain: Pain level controlled  Post assessment: Post-op Vital signs reviewed  Last Vitals:  Filed Vitals:   09/25/13 1656  BP: 112/76  Pulse: 71  Temp: 36.9 C  Resp: 14    Post vital signs: Reviewed  Level of consciousness: sedated  Complications: No apparent anesthesia complications

## 2013-09-25 NOTE — Op Note (Signed)
Christie Le @date @  Procedure: Laparoscopic Cholecystectomy with intraoperative cholangiogram  Surgeon: Kaylyn Lim, MD, FACS Asst:  none  Anes:  General  Drains: None  Findings: Chronic cholecystitis; normal IOC  Description of Procedure: The patient was taken to OR 11 and given general anesthesia.  The patient was prepped with PCMX and draped sterilely. A time out was performed.  Access to the abdomen was achieved with a 5 mm Optiview through the right upper quadrant.  Port placement included three 5 mm and a 11 in the upper midline.    The gallbladder was visualized and the fundus was grasped and the gallbladder was elevated. Traction on the infundibulum allowed for successful demonstration of the critical view. Inflammatory changes were chronic.  The cystic duct was identified and clipped up on the gallbladder and an incision was made in the cystic duct and the Reddick catheter was inserted after milking the cystic duct of any debris. A dynamic cholangiogram was performed which demonstrated small common and intrahepatic ducts.    The cystic duct was then triple clipped and divided, the cystic artery was double clipped and divided and then the gallbladder was removed from the gallbladder bed. Removal of the gallbladder from the gallbladder bed was performed with the hook electrocautery.  The gallbladder was then placed in a bag and brought out through one of the 10 mm trocar sites. The gallbladder bed was inspected and no bleeding or bile leaks were seen.   Laparoscopic visualization was used when closing fascial defects for trocar sites.   Incisions were injected with Exparel and closed with 4-0 Vicryl and Dermabond on the skin.  Sponge and needle count were correct.    The patient was taken to the recovery room in satisfactory condition.

## 2013-09-25 NOTE — Progress Notes (Signed)
Patient arrived to Short Stay from PACU, pt. Requesting to stay overnight, pt. Stated" I'm having a lot of pain", Dr. Hassell Done notified of pt. Requesting to stay overnight, orders received.

## 2013-09-25 NOTE — Interval H&P Note (Signed)
History and Physical Interval Note:  09/25/2013 1:35 PM  Christie Le  has presented today for surgery, with the diagnosis of symptomatic gallstones  The various methods of treatment have been discussed with the patient and family. After consideration of risks, benefits and other options for treatment, the patient has consented to  Procedure(s): LAPAROSCOPIC CHOLECYSTECTOMY WITH INTRAOPERATIVE CHOLANGIOGRAM (N/A) as a surgical intervention .  The patient's history has been reviewed, patient examined, no change in status, stable for surgery.  I have reviewed the patient's chart and labs.  Questions were answered to the patient's satisfaction.     Pedro Earls

## 2013-09-26 ENCOUNTER — Encounter (HOSPITAL_COMMUNITY): Payer: Self-pay | Admitting: Surgery

## 2013-09-26 NOTE — Discharge Summary (Signed)
Physician Discharge Summary  Patient ID: Christie Le MRN: [id number] DOB/AGE: [birth year] 33 y.o.  Admit date: 09/25/2013 Discharge date: 09/26/2013  Admission Diagnoses:  Chronic cholecystitis  Discharge Diagnoses:  same  Active Problems:   S/P laparoscopic cholecystectomy June 15   Surgery:  Lap chole IOC  Discharged Condition: improved  Hospital Course:   Had surgery late in the day.  Pain control issues led [place] to keep her overnight  Consults: none  Significant Diagnostic Studies: IOC    Discharge Exam: Blood pressure 109/65, pulse 58, temperature 98 F (36.7 C), temperature source Oral, resp. rate 16, height 5\' 6"  (1.676 m), weight 163 lb (73.936 kg), SpO2 99.00%. Incisional soreness  Disposition: 01-Home or Self Care  Discharge Instructions   Discharge instructions    Complete by:  As directed   Advance diet as tolerated. No dietary restrictions     Increase activity slowly    Complete by:  As directed      No dressing needed    Complete by:  As directed             Medication List         albuterol 108 (90 BASE) MCG/ACT inhaler  Commonly known as:  PROVENTIL HFA;VENTOLIN HFA  Inhale 2 puffs into the lungs every 6 (six) hours as needed for wheezing or shortness of breath.     cholecalciferol 1000 UNITS tablet  Commonly known as:  VITAMIN D  Take 5,000 Units by mouth daily.     HYDROcodone-acetaminophen 5-325 MG per tablet  Commonly known as:  NORCO  Take 1 tablet by mouth every 4 (four) hours as needed for moderate pain.     ibuprofen 200 MG tablet  Commonly known as:  ADVIL,MOTRIN  Take 400 mg by mouth every 6 (six) hours as needed (Pain).     levonorgestrel 20 MCG/24HR IUD  Commonly known as:  MIRENA  1 each by Intrauterine route once.     VALTREX 1000 MG tablet  Generic drug:  valACYclovir  Take 1,000 mg by mouth 2 (two) times daily as needed (Cold sores).           Follow-up Information   Follow up with Johnathan Hausen B, MD In 3 weeks.    Specialty:  General Surgery   Contact information:   [address] [place] [address] [phone]       Signed: Pedro Earls 09/26/2013, 10:10 AM

## 2013-10-11 ENCOUNTER — Telehealth (INDEPENDENT_AMBULATORY_CARE_PROVIDER_SITE_OTHER): Payer: Self-pay

## 2013-10-11 NOTE — Telephone Encounter (Signed)
Pt s/p lap chole 6/8 with Dr Hassell Done. Pt states that last night she had a increase in pain. Pt denies any n/v, fevers or chills. Pt states that she has been having BM's and she has been passing gas. Pt states that she has been back to work for about a week. Advised the pt that she can scale back on her activities and then increase as tolerated. Advised pt that she can also take up to 800mg  Ibuprofen every 8 hours for pain relief. Advised pt that she can also place a warm compress on the area for pain control. Informed pt to keep a eye on things for signs of infection and if pain continues to call us back. Pt verbalized understanding and agrees with POC.

## 2013-10-13 ENCOUNTER — Ambulatory Visit (INDEPENDENT_AMBULATORY_CARE_PROVIDER_SITE_OTHER): Payer: BC Managed Care – PPO | Admitting: Surgery

## 2013-10-13 ENCOUNTER — Encounter (INDEPENDENT_AMBULATORY_CARE_PROVIDER_SITE_OTHER): Payer: Self-pay | Admitting: Surgery

## 2013-10-13 VITALS — BP 110/72 | HR 71 | Temp 97.6°F | Resp 16 | Ht 66.0 in | Wt 161.4 lb

## 2013-10-13 DIAGNOSIS — Z9049 Acquired absence of other specified parts of digestive tract: Secondary | ICD-10-CM

## 2013-10-13 DIAGNOSIS — Z9889 Other specified postprocedural states: Secondary | ICD-10-CM

## 2013-10-13 NOTE — Patient Instructions (Signed)
Thanks for your patience.  If you need further assistance after leaving the office, please call our office and speak with a [facility] nurse.  [phone].  If you want to leave a message for Dr. Martin, please call his office phone at [phone]. 

## 2013-10-13 NOTE — Progress Notes (Signed)
Christie Le 33 y.o.  Body mass index is 26.06 kg/(m^2).  Patient Active Problem List   Diagnosis Date Noted  . S/P laparoscopic cholecystectomy June 15 09/25/2013  . Allergy   . Anemia   . Anxiety   . Vitamin D deficiency     No Known Allergies    Past Surgical History  Procedure Laterality Date  . Cesarean section  07/29/2009  . Wisdom tooth extraction  2002  . Cholecystectomy N/A 09/25/2013    Procedure: LAPAROSCOPIC CHOLECYSTECTOMY WITH INTRAOPERATIVE CHOLANGIOGRAM;  Surgeon: Pedro Earls, MD;  Location: [facility];  Service: General;  Laterality: N/A;   MCKEOWN,WILLIAM DAVID, MD No diagnosis found.   Doing well.  Path cholecystitis.  Incisions OK.  Will see again prn. Matt B. Hassell Done, MD, [facility] [phone] beeper [phone]  10/13/2013 3:14 PM

## 2013-10-16 ENCOUNTER — Other Ambulatory Visit: Payer: Self-pay | Admitting: Pediatrics

## 2013-10-17 ENCOUNTER — Other Ambulatory Visit: Payer: Self-pay | Admitting: Emergency Medicine

## 2013-10-27 ENCOUNTER — Other Ambulatory Visit: Payer: Self-pay | Admitting: Internal Medicine

## 2013-11-11 ENCOUNTER — Other Ambulatory Visit: Payer: Self-pay | Admitting: Internal Medicine

## 2014-01-11 ENCOUNTER — Telehealth: Payer: Self-pay | Admitting: *Deleted

## 2014-01-11 MED ORDER — PROMETHAZINE HCL 25 MG PO TABS: ORAL_TABLET | ORAL | Status: DC

## 2014-01-11 NOTE — Telephone Encounter (Signed)
Patient called and states she is having nausea, diarrhea due to stomach virus.  She is requesting an RX for Phenergan.  OK to send RX for Phenergan 25 mg tabs per Dr Melford Aase.

## 2014-01-17 ENCOUNTER — Other Ambulatory Visit: Payer: Self-pay | Admitting: Physician Assistant

## 2014-04-09 ENCOUNTER — Other Ambulatory Visit: Payer: Self-pay | Admitting: Physician Assistant

## 2014-04-09 ENCOUNTER — Encounter: Payer: Self-pay | Admitting: Physician Assistant

## 2014-04-09 MED ORDER — AZITHROMYCIN 250 MG PO TABS: ORAL_TABLET | ORAL | Status: DC

## 2014-04-12 ENCOUNTER — Other Ambulatory Visit: Payer: Self-pay | Admitting: Emergency Medicine

## 2014-04-13 ENCOUNTER — Other Ambulatory Visit: Payer: Self-pay | Admitting: Internal Medicine

## 2014-04-23 ENCOUNTER — Other Ambulatory Visit: Payer: Self-pay | Admitting: Physician Assistant

## 2014-05-28 ENCOUNTER — Ambulatory Visit (INDEPENDENT_AMBULATORY_CARE_PROVIDER_SITE_OTHER): Payer: 59 | Admitting: Physician Assistant

## 2014-05-28 ENCOUNTER — Encounter: Payer: Self-pay | Admitting: Physician Assistant

## 2014-05-28 VITALS — BP 110/72 | HR 68 | Temp 98.6°F | Resp 16 | Ht 65.0 in | Wt 157.0 lb

## 2014-05-28 DIAGNOSIS — R5383 Other fatigue: Secondary | ICD-10-CM

## 2014-05-28 DIAGNOSIS — Z79899 Other long term (current) drug therapy: Secondary | ICD-10-CM

## 2014-05-28 DIAGNOSIS — D649 Anemia, unspecified: Secondary | ICD-10-CM

## 2014-05-28 DIAGNOSIS — T7840XD Allergy, unspecified, subsequent encounter: Secondary | ICD-10-CM

## 2014-05-28 DIAGNOSIS — F419 Anxiety disorder, unspecified: Secondary | ICD-10-CM

## 2014-05-28 DIAGNOSIS — E559 Vitamin D deficiency, unspecified: Secondary | ICD-10-CM

## 2014-05-28 LAB — HEPATIC FUNCTION PANEL
ALK PHOS: 60 U/L (ref 39–117)
ALT: 21 U/L (ref 0–35)
AST: 28 U/L (ref 0–37)
Albumin: 4.6 g/dL (ref 3.5–5.2)
BILIRUBIN DIRECT: 0.1 mg/dL (ref 0.0–0.3)
BILIRUBIN INDIRECT: 0.3 mg/dL (ref 0.2–1.2)
Total Bilirubin: 0.4 mg/dL (ref 0.2–1.2)
Total Protein: 7.4 g/dL (ref 6.0–8.3)

## 2014-05-28 LAB — CBC WITH DIFFERENTIAL/PLATELET
Basophils Absolute: 0 10*3/uL (ref 0.0–0.1)
Basophils Relative: 0 % (ref 0–1)
Eosinophils Absolute: 0.1 10*3/uL (ref 0.0–0.7)
Eosinophils Relative: 1 % (ref 0–5)
HCT: 39.4 % (ref 36.0–46.0)
HEMOGLOBIN: 13 g/dL (ref 12.0–15.0)
Lymphocytes Relative: 26 % (ref 12–46)
Lymphs Abs: 2.7 10*3/uL (ref 0.7–4.0)
MCH: 29.5 pg (ref 26.0–34.0)
MCHC: 33 g/dL (ref 30.0–36.0)
MCV: 89.3 fL (ref 78.0–100.0)
MONO ABS: 0.4 10*3/uL (ref 0.1–1.0)
MPV: 11.8 fL (ref 8.6–12.4)
Monocytes Relative: 4 % (ref 3–12)
NEUTROS ABS: 7 10*3/uL (ref 1.7–7.7)
Neutrophils Relative %: 69 % (ref 43–77)
Platelets: 303 10*3/uL (ref 150–400)
RBC: 4.41 MIL/uL (ref 3.87–5.11)
RDW: 12.9 % (ref 11.5–15.5)
WBC: 10.2 10*3/uL (ref 4.0–10.5)

## 2014-05-28 LAB — BASIC METABOLIC PANEL WITH GFR
BUN: 15 mg/dL (ref 6–23)
CHLORIDE: 104 meq/L (ref 96–112)
CO2: 23 meq/L (ref 19–32)
Calcium: 9.2 mg/dL (ref 8.4–10.5)
Creat: 0.7 mg/dL (ref 0.50–1.10)
Glucose, Bld: 91 mg/dL (ref 70–99)
POTASSIUM: 4 meq/L (ref 3.5–5.3)
Sodium: 138 mEq/L (ref 135–145)

## 2014-05-28 LAB — LIPID PANEL
CHOLESTEROL: 147 mg/dL (ref 0–200)
HDL: 46 mg/dL (ref >39)
LDL Cholesterol: 91 mg/dL (ref 0–99)
Total CHOL/HDL Ratio: 3.2 Ratio
Triglycerides: 51 mg/dL (ref <150)
VLDL: 10 mg/dL (ref 0–40)

## 2014-05-28 LAB — MAGNESIUM: MAGNESIUM: 2 mg/dL (ref 1.5–2.5)

## 2014-05-28 MED ORDER — BUPROPION HCL ER (XL) 150 MG PO TB24: 150.0000 mg | ORAL_TABLET | ORAL | Status: DC

## 2014-05-28 NOTE — Progress Notes (Signed)
Complete Physical  Assessment and Plan: 1. Vitamin D deficiency - Vit D  25 hydroxy (rtn osteoporosis monitoring)  2. Anxiety Anxiety- continue medications, stress management techniques discussed, increase water, good sleep hygiene discussed, increase exercise, and increase veggies.   3. Anemia, unspecified anemia type Anemia- monitor, continue iron supp with Vitamin C and increase green leafy veggies  4. Allergy, subsequent encounter Continue OTC allergy pills Get on it for tonsil stones  5. Medication management - Lipid panel - Magnesium  6. Other fatigue - CBC with Differential/Platelet - BASIC METABOLIC PANEL WITH GFR - Hepatic function panel - TSH  Discussed med's effects and SE's. Screening labs and tests as requested with regular follow-up as recommended.  HPI  34 y.o. female  presents for a complete physical.  Her blood pressure has been controlled at home, today their BP is BP: 110/72 mmHg She does workout, 3-4 times a week. She denies chest pain, shortness of breath, dizziness.  She is not on cholesterol medication and denies myalgias. Her cholesterol is at goal. The cholesterol last visit was:   Lab Results  Component Value Date   CHOL 173 05/24/2013   HDL 43 05/24/2013   LDLCALC 111* 05/24/2013   TRIG 95 05/24/2013   CHOLHDL 4.0 05/24/2013   Last A1C in the office was:  Lab Results  Component Value Date   HGBA1C 5.4 05/24/2013  Patient is on Vitamin D supplement. 5000 IU   Lab Results  Component Value Date   VD25OH 33 05/24/2013   Asthma well controlled, uses albuterol inhaler rarely For anxiety better with wellbutrin that she just recently started again, off celexa  Has 33 year old twin girls, dating someone, healthy relationship for 6 months but have been friends for 6 years. Increase stress with her ex husband over her girls/costudy and child care/support.  Some nausea for the last 1-2 week, woke her up last night.   Current Medications:  Current  Outpatient Prescriptions on File Prior to Visit  Medication Sig Dispense Refill  . albuterol (PROAIR HFA) 108 (90 BASE) MCG/ACT inhaler Inhale 1-2 puffs into the lungs every 4 (four) hours as needed for wheezing or shortness of breath (wheezing). 18 g 99  . cholecalciferol (VITAMIN D) 1000 UNITS tablet Take 5,000 Units by mouth daily.    . citalopram (CELEXA) 20 MG tablet Take 20 mg by mouth daily.  1  . fluconazole (DIFLUCAN) 150 MG tablet TAKE ONE TABLET BY MOUTH ONCE WEEKLY. 4 tablet 0  . levonorgestrel (MIRENA) 20 MCG/24HR IUD 1 each by Intrauterine route once.    . promethazine (PHENERGAN) 25 MG tablet Take 1 tab every 4 hours PRN for nausea and vomiting. 30 tablet 0  . valACYclovir (VALTREX) 1000 MG tablet TAKE TWO TABLETS BY MOUTH TWICE DAILY 360 tablet 0   No current facility-administered medications on file prior to visit.   Health Maintenance:   Immunization History  Administered Date(s) Administered  . Influenza-Unspecified 11/21/2012  . Tdap 05/10/2012   Tetanus: 04/2012 Pneumovax: N/A Flu vaccine: 2015 Zostavax: N/A  Pap: 05/2013 normal- due 2018 Mirena needs replaced in May 2016 MGM: N/A DEXA: N/A Colonoscopy: N/A EGD: N.A  Last Dental Exam: Dr. Aida Puffer Last Eye Exam: None, has been to [facility] in the past  Patient Care Team: Unk Pinto, MD as PCP - General (Internal Medicine)  Allergies: No Known Allergies Medical History:  Past Medical History  Diagnosis Date  . Fibrocystic breast   . Vitamin D deficiency   . Seasonal  allergies   . Pneumonia     age 79  . Asthma     no problems since pregnancy  . Anemia     hx of  . PONV (postoperative nausea and vomiting)   . Family history of anesthesia complication     "mom is sensitive- severe n/v"   Surgical History:  Past Surgical History  Procedure Laterality Date  . Cesarean section  07/29/2009  . Wisdom tooth extraction  2002  . Cholecystectomy N/A 09/25/2013    Procedure: LAPAROSCOPIC  CHOLECYSTECTOMY WITH INTRAOPERATIVE CHOLANGIOGRAM;  Surgeon: Pedro Earls, MD;  Location: [facility];  Service: General;  Laterality: N/A;   Family History:  Family History  Problem Relation Age of Onset  . Hypertension Mother   . Diabetes Father   . Cancer Father     prostate  . Cancer Other     breast  . Hypertension Other   . Heart disease Other    Social History:  History  Substance Use Topics  . Smoking status: Never Smoker   . Smokeless tobacco: Never Used  . Alcohol Use: Yes     Comment: rare   Review of Systems: Review of Systems  Constitutional: Positive for malaise/fatigue. Negative for fever, chills, weight loss and diaphoresis.  HENT: Negative.   Eyes: Negative.   Respiratory: Negative.   Cardiovascular: Negative.   Gastrointestinal: Positive for heartburn and nausea. Negative for vomiting, abdominal pain, diarrhea, constipation, blood in stool and melena.  Genitourinary: Negative.   Musculoskeletal: Negative.   Skin: Negative.   Neurological: Negative.  Negative for weakness.  Endo/Heme/Allergies: Negative.   Psychiatric/Behavioral: Negative.     Physical Exam: Estimated body mass index is 26.13 kg/(m^2) as calculated from the following:   Height as of this encounter: 5\' 5"  (1.651 m).   Weight as of this encounter: 157 lb (71.215 kg). BP 110/72 mmHg  Pulse 68  Temp(Src) 98.6 F (37 C)  Resp 16  Ht 5\' 5"  (1.651 m)  Wt 157 lb (71.215 kg)  BMI 26.13 kg/m2 General Appearance: Well nourished, in no apparent distress.  Eyes: PERRLA, EOMs, conjunctiva no swelling or erythema, normal fundi and vessels.  Sinuses: No Frontal/maxillary tenderness  ENT/Mouth: Ext aud canals clear, normal light reflex with TMs without erythema, bulging. Good dentition. No erythema, swelling, or exudate on post pharynx. Tonsils + swelling with tonsil stone on left side. Hearing normal.  Neck: Supple, thyroid normal. No bruits  Respiratory: Respiratory effort normal, BS equal  bilaterally without rales, rhonchi, wheezing or stridor.  Cardio: RRR without murmurs, rubs or gallops. Brisk peripheral pulses without edema.  Chest: symmetric, with normal excursions and percussion.  Breasts: defer OB/GYN Abdomen: Soft, nontender, no guarding, rebound, hernias, masses, or organomegaly. .  Lymphatics: Non tender without lymphadenopathy.  Genitourinary: defer OB/GYN going in Aug to get mirena replaced Musculoskeletal: Full ROM all peripheral extremities,5/5 strength, and normal gait.  Skin: Warm, dry without rashes, lesions, ecchymosis. Neuro: Cranial nerves intact, reflexes equal bilaterally. Normal muscle tone, no cerebellar symptoms. Sensation intact.  Psych: Awake and oriented X 3, normal affect, Insight and Judgment appropriate.   EKG: defer  Vicie Mutters 2:21 PM [facility]

## 2014-05-28 NOTE — Patient Instructions (Addendum)
What Causes Tonsil Stones? Your tonsils are filled with nooks and crannies where bacteria and other materials, including dead cells and mucous, can become trapped. When this happens, the debris can become concentrated in white formations that occur in the pockets. Tonsil stones, or tonsilloliths, are formed when this trapped debris hardens, or calcifies. This tends to happen most often in people who have chronic inflammation in their tonsils or repeated bouts of tonsillitis. While many people have small tonsilloliths that develop in their tonsils, it is quite rare to have a large and solidified tonsil stone.  How Are Tonsil Stones Treated? The appropriate treatment for a tonsil stone depends on the size of the tonsillolith and its potential to cause discomfort or harm. Options include: No treatment. Many tonsil stones, especially ones that have no symptoms, require no special treatment. At-home removal. Some people choose to dislodge tonsil stones at home with the use of picks or swabs. Salt water gargles. Gargling with warm, salty water may help ease the discomfort of tonsillitis, which often accompanies tonsil stones. Allergy pills- Taking allergy pill every day can decrease the inflammation and decrease the formation of tonsil stones Continue reading below...  Antibiotics. Various antibiotics can be used to treat tonsil stones. While they may be helpful for some people, they cannot correct the basic problem that is causing tonsilloliths. Also, antibiotics can have side effects. Surgical removal. When tonsil stones are exceedingly large and symptomatic, it may be necessary for a surgeon to remove them. In certain instances, a doctor will be able to perform this relatively simple procedure using a local numbing agent. Then the patient will not need general anesthesia.  Can Tonsil Stones Be Prevented? Take an antihistamine daily can prevent inflammation and formation.  Since tonsil stones are more  common in people who have chronic tonsillitis, the only surefire way to prevent them is with surgical removal of the tonsils. This procedure, known as a tonsillectomy, removes the tissues of the tonsils entirely, thereby eliminating the possibility of tonsillolith formation, however, adults are more likely to have complications from the surgery than children so it is considered a last resort.   Take protonix for 1-2  weeks, then go to zantac 150-300 mg at night for 2 weeks, then you can stop.  Avoid alcohol, spicy foods, NSAIDS (aleve, ibuprofen) at this time. See foods below.   Food Choices for Gastroesophageal Reflux Disease When you have gastroesophageal reflux disease (GERD), the foods you eat and your eating habits are very important. Choosing the right foods can help ease the discomfort of GERD. WHAT GENERAL GUIDELINES DO I NEED TO FOLLOW?  Choose fruits, vegetables, whole grains, low-fat dairy products, and low-fat meat, fish, and poultry.  Limit fats such as oils, salad dressings, butter, nuts, and avocado.  Keep a food diary to identify foods that cause symptoms.  Avoid foods that cause reflux. These may be different for different people.  Eat frequent small meals instead of three large meals each day.  Eat your meals slowly, in a relaxed setting.  Limit fried foods.  Cook foods using methods other than frying.  Avoid drinking alcohol.  Avoid drinking large amounts of liquids with your meals.  Avoid bending over or lying down until 2-3 hours after eating. WHAT FOODS ARE NOT RECOMMENDED? The following are some foods and drinks that may worsen your symptoms: Vegetables Tomatoes. Tomato juice. Tomato and spaghetti sauce. Chili peppers. Onion and garlic. Horseradish. Fruits Oranges, grapefruit, and lemon (fruit and juice). Meats High-fat meats,  fish, and poultry. This includes hot dogs, ribs, ham, sausage, salami, and bacon. Dairy Whole milk and chocolate milk. Sour cream.  Cream. Butter. Ice cream. Cream cheese.  Beverages Coffee and tea, with or without caffeine. Carbonated beverages or energy drinks. Condiments Hot sauce. Barbecue sauce.  Sweets/Desserts Chocolate and cocoa. Donuts. Peppermint and spearmint. Fats and Oils High-fat foods, including [place] fries and potato chips. Other Vinegar. Strong spices, such as black pepper, white pepper, red pepper, cayenne, curry powder, cloves, ginger, and chili powder.

## 2014-05-29 LAB — TSH: TSH: 1.309 u[IU]/mL (ref 0.350–4.500)

## 2014-05-29 LAB — VITAMIN D 25 HYDROXY (VIT D DEFICIENCY, FRACTURES): VIT D 25 HYDROXY: 40 ng/mL (ref 30–100)

## 2014-07-23 ENCOUNTER — Encounter: Payer: Self-pay | Admitting: Physician Assistant

## 2014-07-23 MED ORDER — MONTELUKAST SODIUM 10 MG PO TABS: 10.0000 mg | ORAL_TABLET | Freq: Every day | ORAL | Status: DC

## 2014-08-21 ENCOUNTER — Other Ambulatory Visit: Payer: Self-pay | Admitting: Internal Medicine

## 2014-09-11 ENCOUNTER — Other Ambulatory Visit: Payer: Self-pay | Admitting: Physician Assistant

## 2014-10-11 ENCOUNTER — Other Ambulatory Visit: Payer: Self-pay | Admitting: Physician Assistant

## 2015-01-18 ENCOUNTER — Other Ambulatory Visit: Payer: Self-pay | Admitting: Internal Medicine

## 2015-03-20 ENCOUNTER — Other Ambulatory Visit: Payer: Self-pay | Admitting: Physician Assistant

## 2015-05-10 ENCOUNTER — Ambulatory Visit (INDEPENDENT_AMBULATORY_CARE_PROVIDER_SITE_OTHER): Payer: BLUE CROSS/BLUE SHIELD | Admitting: Internal Medicine

## 2015-05-10 ENCOUNTER — Encounter: Payer: Self-pay | Admitting: Internal Medicine

## 2015-05-10 VITALS — BP 122/74 | HR 100 | Temp 98.9°F | Resp 16 | Ht 65.0 in | Wt 141.6 lb

## 2015-05-10 DIAGNOSIS — J069 Acute upper respiratory infection, unspecified: Secondary | ICD-10-CM

## 2015-05-10 MED ORDER — PREDNISONE 20 MG PO TABS: ORAL_TABLET | ORAL | Status: DC

## 2015-05-10 MED ORDER — PROMETHAZINE-DM 6.25-15 MG/5ML PO SYRP: ORAL_SOLUTION | ORAL | Status: DC

## 2015-05-10 MED ORDER — AZITHROMYCIN 250 MG PO TABS: ORAL_TABLET | ORAL | Status: DC

## 2015-05-10 MED ORDER — FLUTICASONE PROPIONATE 50 MCG/ACT NA SUSP: 2.0000 | Freq: Every day | NASAL | Status: DC

## 2015-05-10 NOTE — Progress Notes (Signed)
Patient ID: Christie Le, female   DOB: 03/25/1981, 35 y.o.   MRN: [id number]  HPI  Patient presents to the office for evaluation of cough.  It has been going on for 1 weeks.  Patient reports dry cough with mild post nasal drainage.  They also endorse chills, fever, postnasal drip, shortness of breath, wheezing and nasal congestion, sinus pressure, sore throat.  .  They have tried sudafed, ibuprofen, claritin D, tylenol.  They report that nothing has worked.  They admits to other sick contacts.   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    Review of Systems  Constitutional: Negative for fever, chills and malaise/fatigue.  HENT: Positive for congestion and sore throat. Negative for ear pain.   Eyes: Negative.   Respiratory: Positive for cough, shortness of breath and wheezing.   Cardiovascular: Negative for chest pain, palpitations and leg swelling.  Skin: Negative.   Neurological: Negative for headaches.    PE:  Filed Vitals:   05/10/15 1049  BP: 122/74  Pulse: 100  Temp: 98.9 F (37.2 C)  Resp: 16   General:  Alert and non-toxic, WDWN, NAD HEENT: NCAT, PERLA, EOM normal, no occular discharge or erythema.  Nasal mucosal edema with sinus tenderness to palpation.  Oropharynx clear with minimal oropharyngeal edema and erythema.  Mucous membranes moist and pink. Neck:  Cervical adenopathy Chest:  RRR no MRGs.  Lungs clear to auscultation A&P with no wheezes rhonchi or rales.   Abdomen: +BS x 4 quadrants, soft, non-tender, no guarding, rigidity, or rebound. Skin: warm and dry no rash Neuro: A&Ox4, CN II-XII grossly intact  Assessment and Plan:  -Breo sample given -albuterol prn -phenergan dm -zpak -prednisone -claritin -nasal saline -fluids

## 2015-05-18 ENCOUNTER — Other Ambulatory Visit: Payer: Self-pay | Admitting: Internal Medicine

## 2015-06-05 ENCOUNTER — Encounter: Payer: Self-pay | Admitting: Physician Assistant

## 2015-06-28 ENCOUNTER — Ambulatory Visit (INDEPENDENT_AMBULATORY_CARE_PROVIDER_SITE_OTHER): Payer: BLUE CROSS/BLUE SHIELD | Admitting: Physician Assistant

## 2015-06-28 ENCOUNTER — Encounter: Payer: Self-pay | Admitting: Physician Assistant

## 2015-06-28 VITALS — BP 100/70 | HR 80 | Temp 97.7°F | Resp 16 | Ht 66.0 in | Wt 142.6 lb

## 2015-06-28 DIAGNOSIS — Z0000 Encounter for general adult medical examination without abnormal findings: Secondary | ICD-10-CM

## 2015-06-28 DIAGNOSIS — Z79899 Other long term (current) drug therapy: Secondary | ICD-10-CM

## 2015-06-28 DIAGNOSIS — Z131 Encounter for screening for diabetes mellitus: Secondary | ICD-10-CM

## 2015-06-28 DIAGNOSIS — E559 Vitamin D deficiency, unspecified: Secondary | ICD-10-CM

## 2015-06-28 DIAGNOSIS — D649 Anemia, unspecified: Secondary | ICD-10-CM

## 2015-06-28 DIAGNOSIS — Z1389 Encounter for screening for other disorder: Secondary | ICD-10-CM

## 2015-06-28 DIAGNOSIS — T7840XD Allergy, unspecified, subsequent encounter: Secondary | ICD-10-CM

## 2015-06-28 DIAGNOSIS — Z13220 Encounter for screening for lipoid disorders: Secondary | ICD-10-CM

## 2015-06-28 LAB — HEPATIC FUNCTION PANEL
ALK PHOS: 56 U/L (ref 33–115)
ALT: 11 U/L (ref 6–29)
AST: 16 U/L (ref 10–30)
Albumin: 4.2 g/dL (ref 3.6–5.1)
BILIRUBIN DIRECT: 0.1 mg/dL (ref <=0.2)
BILIRUBIN INDIRECT: 0.5 mg/dL (ref 0.2–1.2)
BILIRUBIN TOTAL: 0.6 mg/dL (ref 0.2–1.2)
Total Protein: 7 g/dL (ref 6.1–8.1)

## 2015-06-28 LAB — CBC WITH DIFFERENTIAL/PLATELET
BASOS ABS: 0 10*3/uL (ref 0.0–0.1)
BASOS PCT: 0 % (ref 0–1)
EOS ABS: 0.1 10*3/uL (ref 0.0–0.7)
Eosinophils Relative: 1 % (ref 0–5)
HCT: 38.3 % (ref 36.0–46.0)
HEMOGLOBIN: 12.8 g/dL (ref 12.0–15.0)
LYMPHS ABS: 2.6 10*3/uL (ref 0.7–4.0)
Lymphocytes Relative: 27 % (ref 12–46)
MCH: 29.7 pg (ref 26.0–34.0)
MCHC: 33.4 g/dL (ref 30.0–36.0)
MCV: 88.9 fL (ref 78.0–100.0)
MONOS PCT: 6 % (ref 3–12)
MPV: 11.4 fL (ref 8.6–12.4)
Monocytes Absolute: 0.6 10*3/uL (ref 0.1–1.0)
NEUTROS ABS: 6.5 10*3/uL (ref 1.7–7.7)
NEUTROS PCT: 66 % (ref 43–77)
PLATELETS: 265 10*3/uL (ref 150–400)
RBC: 4.31 MIL/uL (ref 3.87–5.11)
RDW: 12.6 % (ref 11.5–15.5)
WBC: 9.8 10*3/uL (ref 4.0–10.5)

## 2015-06-28 LAB — BASIC METABOLIC PANEL WITH GFR
BUN: 11 mg/dL (ref 7–25)
CHLORIDE: 105 mmol/L (ref 98–110)
CO2: 26 mmol/L (ref 20–31)
CREATININE: 0.73 mg/dL (ref 0.50–1.10)
Calcium: 9.2 mg/dL (ref 8.6–10.2)
GFR, Est African American: >89 mL/min (ref >=60)
GFR, Est Non African American: >89 mL/min (ref >=60)
Glucose, Bld: 83 mg/dL (ref 65–99)
POTASSIUM: 4.2 mmol/L (ref 3.5–5.3)
SODIUM: 139 mmol/L (ref 135–146)

## 2015-06-28 LAB — MAGNESIUM: Magnesium: 2 mg/dL (ref 1.5–2.5)

## 2015-06-28 LAB — LIPID PANEL
CHOL/HDL RATIO: 3.5 ratio (ref <=5.0)
Cholesterol: 141 mg/dL (ref 125–200)
HDL: 40 mg/dL — AB (ref >=46)
LDL CALC: 88 mg/dL (ref <130)
Triglycerides: 67 mg/dL (ref <150)
VLDL: 13 mg/dL (ref <30)

## 2015-06-28 LAB — VITAMIN B12: Vitamin B-12: 908 pg/mL (ref 200–1100)

## 2015-06-28 LAB — IRON AND TIBC
%SAT: 41 % (ref 11–50)
IRON: 121 ug/dL (ref 40–190)
TIBC: 297 ug/dL (ref 250–450)
UIBC: 176 ug/dL (ref 125–400)

## 2015-06-28 LAB — FERRITIN: FERRITIN: 53 ng/mL (ref 10–154)

## 2015-06-28 LAB — TSH: TSH: 1.18 mIU/L

## 2015-06-28 NOTE — Progress Notes (Signed)
Complete Physical  Assessment and Plan: 1. Vitamin D deficiency - Vit D  25 hydroxy (rtn osteoporosis monitoring)  2. Anxiety Anxiety- continue medications, stress management techniques discussed, increase water, good sleep hygiene discussed, increase exercise, and increase veggies.   3. Anemia, unspecified anemia type Anemia- monitor, continue iron supp with Vitamin C and increase green leafy veggies  4. Allergy, subsequent encounter Continue OTC allergy pills  5. Medication management - Lipid panel - Magnesium  6. Asthma Continue singulair   Discussed med's effects and SE's. Screening labs and tests as requested with regular follow-up as recommended.  HPI  35 y.o. female  presents for a complete physical.  Her blood pressure has been controlled at home, today their BP is BP: 100/70 mmHg She does workout, 3-4 times a week. She denies chest pain, shortness of breath, dizziness.  She is not on cholesterol medication and denies myalgias. Her cholesterol is at goal. The cholesterol last visit was:   Lab Results  Component Value Date   CHOL 147 05/28/2014   HDL 46 05/28/2014   LDLCALC 91 05/28/2014   TRIG 51 05/28/2014   CHOLHDL 3.2 05/28/2014   Last A1C in the office was:  Lab Results  Component Value Date   HGBA1C 5.4 05/24/2013  Patient is on Vitamin D supplement. 5000 IU   Lab Results  Component Value Date   VD25OH 40 05/28/2014   Asthma well controlled, uses albuterol inhaler rarely Has not been taking wellbutrin consistently.  Has 34 year old twin girls, at [employer], dating someone, will, friends x 26 years, now has 4 kids total, but he is very supportive, getting along with ex husband, josh.  Some nausea for the last 1-2 week, woke her up last night.  BMI is Body mass index is 23.03 kg/(m^2)., she is working on diet and exercise. Wt Readings from Last 3 Encounters:  06/28/15 142 lb 9.6 oz (64.683 kg)  05/10/15 141 lb 9.6 oz (64.229 kg)   05/28/14 157 lb (71.215 kg)    Current Medications:  Current Outpatient Prescriptions on File Prior to Visit  Medication Sig Dispense Refill  . buPROPion (WELLBUTRIN XL) 150 MG 24 hr tablet TAKE 1 TABLET BY MOUTH EVERY MORNING 30 tablet 3  . cholecalciferol (VITAMIN D) 1000 UNITS tablet Take 5,000 Units by mouth daily.    . fluticasone (FLONASE) 50 MCG/ACT nasal spray Place 2 sprays into both nostrils daily. 16 g 2  . levonorgestrel (MIRENA) 20 MCG/24HR IUD 1 each by Intrauterine route once.    . montelukast (SINGULAIR) 10 MG tablet TAKE 1 TABLET BY MOUTH EVERY DAY 90 tablet 99  . PROAIR HFA 108 (90 Base) MCG/ACT inhaler Inhale 1-2 puffs into the lungs every 4 hours as needed for wheezing or shortness of breath. 18 g 99  . valACYclovir (VALTREX) 1000 MG tablet TAKE TWO TABLETS BY MOUTH TWICE DAILY 360 tablet 0  . azithromycin (ZITHROMAX Z-PAK) 250 MG tablet 2 po day one, then 1 daily x 4 days (Patient not taking: Reported on 06/28/2015) 6 tablet 0  . fluconazole (DIFLUCAN) 150 MG tablet TAKE ONE TABLET BY MOUTH ONCE WEEKLY. (Patient not taking: Reported on 06/28/2015) 4 tablet 0  . predniSONE (DELTASONE) 20 MG tablet 3 tabs po daily x 3 days, then 2 tabs x 3 days, then 1.5 tabs x 3 days, then 1 tab x 3 days, then 0.5 tabs x 3 days (Patient not taking: Reported on 06/28/2015) 27 tablet 0  . promethazine (PHENERGAN) 25 MG  tablet TAKE ONE TABLET BY MOUTH EVERY 4 HOURS AS NEEDED FOR NAUSEA AND VOMITING (Patient not taking: Reported on 06/28/2015) 30 tablet 1  . promethazine-dextromethorphan (PROMETHAZINE-DM) 6.25-15 MG/5ML syrup Take 5-10 mL PO q8hrs prn for cold symptoms. (Patient not taking: Reported on 06/28/2015) 360 mL 1   No current facility-administered medications on file prior to visit.   Health Maintenance:   Immunization History  Administered Date(s) Administered  . Influenza-Unspecified 11/21/2012  . Tdap 05/10/2012   Tetanus: 04/2012 Pneumovax: N/A Flu vaccine: 2015 didn't this  year Zostavax: N/A  Pap: 05/2014- mirena placed  MGM: N/A DEXA: N/A Colonoscopy: N/A EGD: N.A  Last Dental Exam: Dr. Aida Puffer Last Eye Exam: None, has been to [facility] in the past  Patient Care Team: Unk Pinto, MD as PCP - General (Internal Medicine)  Allergies: No Known Allergies Medical History:  Past Medical History  Diagnosis Date  . Fibrocystic breast   . Vitamin D deficiency   . Seasonal allergies   . Pneumonia     age 72  . Asthma     no problems since pregnancy  . Anemia     hx of  . PONV (postoperative nausea and vomiting)   . Family history of anesthesia complication     "mom is sensitive- severe n/v"   Surgical History:  Past Surgical History  Procedure Laterality Date  . Cesarean section  07/29/2009  . Wisdom tooth extraction  2002  . Cholecystectomy N/A 09/25/2013    Procedure: LAPAROSCOPIC CHOLECYSTECTOMY WITH INTRAOPERATIVE CHOLANGIOGRAM;  Surgeon: Pedro Earls, MD;  Location: [facility];  Service: General;  Laterality: N/A;   Family History:  Family History  Problem Relation Age of Onset  . Hypertension Mother   . Diabetes Father   . Cancer Father     prostate  . Cancer Other     breast  . Hypertension Other   . Heart disease Other    Social History:  Social History  Substance Use Topics  . Smoking status: Never Smoker   . Smokeless tobacco: Never Used  . Alcohol Use: Yes     Comment: rare   Review of Systems: Review of Systems  Constitutional: Positive for malaise/fatigue. Negative for fever, chills, weight loss and diaphoresis.  HENT: Positive for congestion. Negative for ear discharge, ear pain, hearing loss, nosebleeds, sore throat and tinnitus.   Eyes: Negative.   Respiratory: Negative.  Negative for stridor.   Cardiovascular: Negative.   Gastrointestinal: Negative for heartburn, nausea, vomiting, abdominal pain, diarrhea, constipation, blood in stool and melena.  Genitourinary: Negative.   Musculoskeletal: Negative.    Skin: Negative.   Neurological: Negative.  Negative for weakness and headaches.  Endo/Heme/Allergies: Negative.   Psychiatric/Behavioral: Negative.     Physical Exam: Estimated body mass index is 23.03 kg/(m^2) as calculated from the following:   Height as of this encounter: 5\' 6"  (1.676 m).   Weight as of this encounter: 142 lb 9.6 oz (64.683 kg). BP 100/70 mmHg  Pulse 80  Temp(Src) 97.7 F (36.5 C) (Temporal)  Resp 16  Ht 5\' 6"  (1.676 m)  Wt 142 lb 9.6 oz (64.683 kg)  BMI 23.03 kg/m2  SpO2 97% General Appearance: Well nourished, in no apparent distress.  Eyes: PERRLA, EOMs, conjunctiva no swelling or erythema, normal fundi and vessels.  Sinuses: No Frontal/maxillary tenderness  ENT/Mouth: Ext aud canals clear, normal light reflex with TMs without erythema, bulging. Good dentition. No erythema, swelling, or exudate on post pharynx. Tonsils + swelling with tonsil  stone on left side. Hearing normal.  Neck: Supple, thyroid normal. No bruits  Respiratory: Respiratory effort normal, BS equal bilaterally without rales, rhonchi, wheezing or stridor.  Cardio: RRR without murmurs, rubs or gallops. Brisk peripheral pulses without edema.  Chest: symmetric, with normal excursions and percussion.  Breasts: defer OB/GYN Abdomen: Soft, nontender, no guarding, rebound, hernias, masses, or organomegaly. .  Lymphatics: Non tender without lymphadenopathy.  Genitourinary: defer OB/GYN going in Aug to get mirena replaced Musculoskeletal: Full ROM all peripheral extremities,5/5 strength, and normal gait.  Skin: Warm, dry without rashes, lesions, ecchymosis. Neuro: Cranial nerves intact, reflexes equal bilaterally. Normal muscle tone, no cerebellar symptoms. Sensation intact.  Psych: Awake and oriented X 3, normal affect, Insight and Judgment appropriate.   EKG: defer  Vicie Mutters 10:13 AM [facility]

## 2015-06-28 NOTE — Patient Instructions (Signed)
Preventive Care for Adults A healthy lifestyle and preventive care can promote health and wellness. Preventive health guidelines for women include the following key practices.  A routine yearly physical is a good way to check with your health care provider about your health and preventive screening. It is a chance to share any concerns and updates on your health and to receive a thorough exam.  Visit your dentist for a routine exam and preventive care every 6 months. Brush your teeth twice a day and floss once a day. Good oral hygiene prevents tooth decay and gum disease.  The frequency of eye exams is based on your age, health, family medical history, use of contact lenses, and other factors. Follow your health care provider's recommendations for frequency of eye exams.  Eat a healthy diet. Foods like vegetables, fruits, whole grains, low-fat dairy products, and lean protein foods contain the nutrients you need without too many calories. Decrease your intake of foods high in solid fats, added sugars, and salt. Eat the right amount of calories for you.Get information about a proper diet from your health care provider, if necessary.  Regular physical exercise is one of the most important things you can do for your health. Most adults should get at least 150 minutes of moderate-intensity exercise (any activity that increases your heart rate and causes you to sweat) each week. In addition, most adults need muscle-strengthening exercises on 2 or more days a week.  Maintain a healthy weight. The body mass index (BMI) is a screening tool to identify possible weight problems. It provides an estimate of body fat based on height and weight. Your health care provider can find your BMI and can help you achieve or maintain a healthy weight.For adults 20 years and older:  A BMI below 18.5 is considered underweight.  A BMI of 18.5 to 24.9 is normal.  A BMI of 25 to 29.9 is considered overweight.  A BMI of  30 and above is considered obese.  Maintain normal blood lipids and cholesterol levels by exercising and minimizing your intake of saturated fat. Eat a balanced diet with plenty of fruit and vegetables. Blood tests for lipids and cholesterol should begin at age 76 and be repeated every 5 years. If your lipid or cholesterol levels are high, you are over 50, or you are at high risk for heart disease, you may need your cholesterol levels checked more frequently.Ongoing high lipid and cholesterol levels should be treated with medicines if diet and exercise are not working.  If you smoke, find out from your health care provider how to quit. If you do not use tobacco, do not start.  Lung cancer screening is recommended for adults aged 22-80 years who are at high risk for developing lung cancer because of a history of smoking. A yearly low-dose CT scan of the lungs is recommended for people who have at least a 30-pack-year history of smoking and are a current smoker or have quit within the past 15 years. A pack year of smoking is smoking an average of 1 pack of cigarettes a day for 1 year (for example: 1 pack a day for 30 years or 2 packs a day for 15 years). Yearly screening should continue until the smoker has stopped smoking for at least 15 years. Yearly screening should be stopped for people who develop a health problem that would prevent them from having lung cancer treatment.  If you are pregnant, do not drink alcohol. If you are breastfeeding,  be very cautious about drinking alcohol. If you are not pregnant and choose to drink alcohol, do not have more than 1 drink per day. One drink is considered to be 12 ounces (355 mL) of beer, 5 ounces (148 mL) of wine, or 1.5 ounces (44 mL) of liquor.  Avoid use of street drugs. Do not share needles with anyone. Ask for help if you need support or instructions about stopping the use of drugs.  High blood pressure causes heart disease and increases the risk of  stroke. Your blood pressure should be checked at least every 1 to 2 years. Ongoing high blood pressure should be treated with medicines if weight loss and exercise do not work.  If you are 75-52 years old, ask your health care provider if you should take aspirin to prevent strokes.  Diabetes screening involves taking a blood sample to check your fasting blood sugar level. This should be done once every 3 years, after age 15, if you are within normal weight and without risk factors for diabetes. Testing should be considered at a younger age or be carried out more frequently if you are overweight and have at least 1 risk factor for diabetes.  Breast cancer screening is essential preventive care for women. You should practice "breast self-awareness." This means understanding the normal appearance and feel of your breasts and may include breast self-examination. Any changes detected, no matter how small, should be reported to a health care provider. Women in their 58s and 30s should have a clinical breast exam (CBE) by a health care provider as part of a regular health exam every 1 to 3 years. After age 16, women should have a CBE every year. Starting at age 53, women should consider having a mammogram (breast X-ray test) every year. Women who have a family history of breast cancer should talk to their health care provider about genetic screening. Women at a high risk of breast cancer should talk to their health care providers about having an MRI and a mammogram every year.  Breast cancer gene (BRCA)-related cancer risk assessment is recommended for women who have family members with BRCA-related cancers. BRCA-related cancers include breast, ovarian, tubal, and peritoneal cancers. Having family members with these cancers may be associated with an increased risk for harmful changes (mutations) in the breast cancer genes BRCA1 and BRCA2. Results of the assessment will determine the need for genetic counseling and  BRCA1 and BRCA2 testing.  Routine pelvic exams to screen for cancer are no longer recommended for nonpregnant women who are considered low risk for cancer of the pelvic organs (ovaries, uterus, and vagina) and who do not have symptoms. Ask your health care provider if a screening pelvic exam is right for you.  If you have had past treatment for cervical cancer or a condition that could lead to cancer, you need Pap tests and screening for cancer for at least 20 years after your treatment. If Pap tests have been discontinued, your risk factors (such as having a new sexual partner) need to be reassessed to determine if screening should be resumed. Some women have medical problems that increase the chance of getting cervical cancer. In these cases, your health care provider may recommend more frequent screening and Pap tests.  The HPV test is an additional test that may be used for cervical cancer screening. The HPV test looks for the virus that can cause the cell changes on the cervix. The cells collected during the Pap test can be  tested for HPV. The HPV test could be used to screen women aged 30 years and older, and should be used in women of any age who have unclear Pap test results. After the age of 30, women should have HPV testing at the same frequency as a Pap test.  Colorectal cancer can be detected and often prevented. Most routine colorectal cancer screening begins at the age of 50 years and continues through age 75 years. However, your health care provider may recommend screening at an earlier age if you have risk factors for colon cancer. On a yearly basis, your health care provider may provide home test kits to check for hidden blood in the stool. Use of a small camera at the end of a tube, to directly examine the colon (sigmoidoscopy or colonoscopy), can detect the earliest forms of colorectal cancer. Talk to your health care provider about this at age 50, when routine screening begins. Direct  exam of the colon should be repeated every 5-10 years through age 75 years, unless early forms of pre-cancerous polyps or small growths are found.  People who are at an increased risk for hepatitis B should be screened for this virus. You are considered at high risk for hepatitis B if:  You were born in a country where hepatitis B occurs often. Talk with your health care provider about which countries are considered high risk.  Your parents were born in a high-risk country and you have not received a shot to protect against hepatitis B (hepatitis B vaccine).  You have HIV or AIDS.  You use needles to inject street drugs.  You live with, or have sex with, someone who has hepatitis B.  You get hemodialysis treatment.  You take certain medicines for conditions like cancer, organ transplantation, and autoimmune conditions.  Hepatitis C blood testing is recommended for all people born from 1945 through 1965 and any individual with known risks for hepatitis C.  Practice safe sex. Use condoms and avoid high-risk sexual practices to reduce the spread of sexually transmitted infections (STIs). STIs include gonorrhea, chlamydia, syphilis, trichomonas, herpes, HPV, and human immunodeficiency virus (HIV). Herpes, HIV, and HPV are viral illnesses that have no cure. They can result in disability, cancer, and death.  You should be screened for sexually transmitted illnesses (STIs) including gonorrhea and chlamydia if:  You are sexually active and are younger than 24 years.  You are older than 24 years and your health care provider tells you that you are at risk for this type of infection.  Your sexual activity has changed since you were last screened and you are at an increased risk for chlamydia or gonorrhea. Ask your health care provider if you are at risk.  If you are at risk of being infected with HIV, it is recommended that you take a prescription medicine daily to prevent HIV infection. This is  called preexposure prophylaxis (PrEP). You are considered at risk if:  You are a heterosexual woman, are sexually active, and are at increased risk for HIV infection.  You take drugs by injection.  You are sexually active with a partner who has HIV.  Talk with your health care provider about whether you are at high risk of being infected with HIV. If you choose to begin PrEP, you should first be tested for HIV. You should then be tested every 3 months for as long as you are taking PrEP.  Osteoporosis is a disease in which the bones lose minerals and strength   with aging. This can result in serious bone fractures or breaks. The risk of osteoporosis can be identified using a bone density scan. Women ages 65 years and over and women at risk for fractures or osteoporosis should discuss screening with their health care providers. Ask your health care provider whether you should take a calcium supplement or vitamin D to reduce the rate of osteoporosis.  Menopause can be associated with physical symptoms and risks. Hormone replacement therapy is available to decrease symptoms and risks. You should talk to your health care provider about whether hormone replacement therapy is right for you.  Use sunscreen. Apply sunscreen liberally and repeatedly throughout the day. You should seek shade when your shadow is shorter than you. Protect yourself by wearing long sleeves, pants, a wide-brimmed hat, and sunglasses year round, whenever you are outdoors.  Once a month, do a whole body skin exam, using a mirror to look at the skin on your back. Tell your health care provider of new moles, moles that have irregular borders, moles that are larger than a pencil eraser, or moles that have changed in shape or color.  Stay current with required vaccines (immunizations).  Influenza vaccine. All adults should be immunized every year.  Tetanus, diphtheria, and acellular pertussis (Td, Tdap) vaccine. Pregnant women should  receive 1 dose of Tdap vaccine during each pregnancy. The dose should be obtained regardless of the length of time since the last dose. Immunization is preferred during the 27th-36th week of gestation. An adult who has not previously received Tdap or who does not know her vaccine status should receive 1 dose of Tdap. This initial dose should be followed by tetanus and diphtheria toxoids (Td) booster doses every 10 years. Adults with an unknown or incomplete history of completing a 3-dose immunization series with Td-containing vaccines should begin or complete a primary immunization series including a Tdap dose. Adults should receive a Td booster every 10 years.  Varicella vaccine. An adult without evidence of immunity to varicella should receive 2 doses or a second dose if she has previously received 1 dose. Pregnant females who do not have evidence of immunity should receive the first dose after pregnancy. This first dose should be obtained before leaving the health care facility. The second dose should be obtained 4-8 weeks after the first dose.  Human papillomavirus (HPV) vaccine. Females aged 13-26 years who have not received the vaccine previously should obtain the 3-dose series. The vaccine is not recommended for use in pregnant females. However, pregnancy testing is not needed before receiving a dose. If a female is found to be pregnant after receiving a dose, no treatment is needed. In that case, the remaining doses should be delayed until after the pregnancy. Immunization is recommended for any person with an immunocompromised condition through the age of 26 years if she did not get any or all doses earlier. During the 3-dose series, the second dose should be obtained 4-8 weeks after the first dose. The third dose should be obtained 24 weeks after the first dose and 16 weeks after the second dose.  Zoster vaccine. One dose is recommended for adults aged 60 years or older unless certain conditions are  present.  Measles, mumps, and rubella (MMR) vaccine. Adults born before 1957 generally are considered immune to measles and mumps. Adults born in 1957 or later should have 1 or more doses of MMR vaccine unless there is a contraindication to the vaccine or there is laboratory evidence of immunity to   each of the three diseases. A routine second dose of MMR vaccine should be obtained at least 28 days after the first dose for students attending postsecondary schools, health care workers, or international travelers. People who received inactivated measles vaccine or an unknown type of measles vaccine during 1963-1967 should receive 2 doses of MMR vaccine. People who received inactivated mumps vaccine or an unknown type of mumps vaccine before 1979 and are at high risk for mumps infection should consider immunization with 2 doses of MMR vaccine. For females of childbearing age, rubella immunity should be determined. If there is no evidence of immunity, females who are not pregnant should be vaccinated. If there is no evidence of immunity, females who are pregnant should delay immunization until after pregnancy. Unvaccinated health care workers born before 1957 who lack laboratory evidence of measles, mumps, or rubella immunity or laboratory confirmation of disease should consider measles and mumps immunization with 2 doses of MMR vaccine or rubella immunization with 1 dose of MMR vaccine.  Pneumococcal 13-valent conjugate (PCV13) vaccine. When indicated, a person who is uncertain of her immunization history and has no record of immunization should receive the PCV13 vaccine. An adult aged 19 years or older who has certain medical conditions and has not been previously immunized should receive 1 dose of PCV13 vaccine. This PCV13 should be followed with a dose of pneumococcal polysaccharide (PPSV23) vaccine. The PPSV23 vaccine dose should be obtained at least 8 weeks after the dose of PCV13 vaccine. An adult aged 19  years or older who has certain medical conditions and previously received 1 or more doses of PPSV23 vaccine should receive 1 dose of PCV13. The PCV13 vaccine dose should be obtained 1 or more years after the last PPSV23 vaccine dose.  Pneumococcal polysaccharide (PPSV23) vaccine. When PCV13 is also indicated, PCV13 should be obtained first. All adults aged 65 years and older should be immunized. An adult younger than age 65 years who has certain medical conditions should be immunized. Any person who resides in a nursing home or long-term care facility should be immunized. An adult smoker should be immunized. People with an immunocompromised condition and certain other conditions should receive both PCV13 and PPSV23 vaccines. People with human immunodeficiency virus (HIV) infection should be immunized as soon as possible after diagnosis. Immunization during chemotherapy or radiation therapy should be avoided. Routine use of PPSV23 vaccine is not recommended for American Indians, [place] Natives, or people younger than 65 years unless there are medical conditions that require PPSV23 vaccine. When indicated, people who have unknown immunization and have no record of immunization should receive PPSV23 vaccine. One-time revaccination 5 years after the first dose of PPSV23 is recommended for people aged 19-64 years who have chronic kidney failure, nephrotic syndrome, asplenia, or immunocompromised conditions. People who received 1-2 doses of PPSV23 before age 65 years should receive another dose of PPSV23 vaccine at age 65 years or later if at least 5 years have passed since the previous dose. Doses of PPSV23 are not needed for people immunized with PPSV23 at or after age 65 years.  Meningococcal vaccine. Adults with asplenia or persistent complement component deficiencies should receive 2 doses of quadrivalent meningococcal conjugate (MenACWY-D) vaccine. The doses should be obtained at least 2 months apart.  Microbiologists working with certain meningococcal bacteria, military recruits, people at risk during an outbreak, and people who travel to or live in countries with a high rate of meningitis should be immunized. A first-year college student up through age   21 years who is living in a residence hall should receive a dose if she did not receive a dose on or after her 16th birthday. Adults who have certain high-risk conditions should receive one or more doses of vaccine.  Hepatitis A vaccine. Adults who wish to be protected from this disease, have certain high-risk conditions, work with hepatitis A-infected animals, work in hepatitis A research labs, or travel to or work in countries with a high rate of hepatitis A should be immunized. Adults who were previously unvaccinated and who anticipate close contact with an international adoptee during the first 60 days after arrival in the [place] from a country with a high rate of hepatitis A should be immunized.  Hepatitis B vaccine. Adults who wish to be protected from this disease, have certain high-risk conditions, may be exposed to blood or other infectious body fluids, are household contacts or sex partners of hepatitis B positive people, are clients or workers in certain care facilities, or travel to or work in countries with a high rate of hepatitis B should be immunized.  Haemophilus influenzae type b (Hib) vaccine. A previously unvaccinated person with asplenia or sickle cell disease or having a scheduled splenectomy should receive 1 dose of Hib vaccine. Regardless of previous immunization, a recipient of a hematopoietic stem cell transplant should receive a 3-dose series 6-12 months after her successful transplant. Hib vaccine is not recommended for adults with HIV infection. Preventive Services / Frequency  Ages 19 to 39 years 1. Blood pressure check. 2. Lipid and cholesterol check. 3. Clinical breast exam.** / Every 3 years for women in their  20s and 30s. 4. BRCA-related cancer risk assessment.** / For women who have family members with a BRCA-related cancer (breast, ovarian, tubal, or peritoneal cancers). 5. Pap test.** / Every 2 years from ages 21 through 29. Every 3 years starting at age 30 through age 65 or 70 with a history of 3 consecutive normal Pap tests. 6. HPV screening.** / Every 3 years from ages 30 through ages 65 to 70 with a history of 3 consecutive normal Pap tests. 7. Hepatitis C blood test.** / For any individual with known risks for hepatitis C. 8. Skin self-exam. / Monthly. 9. Influenza vaccine. / Every year. 10. Tetanus, diphtheria, and acellular pertussis (Tdap, Td) vaccine.** / Consult your health care provider. Pregnant women should receive 1 dose of Tdap vaccine during each pregnancy. 1 dose of Td every 10 years. 11. Varicella vaccine.** / Consult your health care provider. Pregnant females who do not have evidence of immunity should receive the first dose after pregnancy. 12. HPV vaccine. / 3 doses over 6 months, if 26 and younger. The vaccine is not recommended for use in pregnant females. However, pregnancy testing is not needed before receiving a dose. 13. Measles, mumps, rubella (MMR) vaccine.** / You need at least 1 dose of MMR if you were born in 1957 or later. You may also need a 2nd dose. For females of childbearing age, rubella immunity should be determined. If there is no evidence of immunity, females who are not pregnant should be vaccinated. If there is no evidence of immunity, females who are pregnant should delay immunization until after pregnancy. 14. Pneumococcal 13-valent conjugate (PCV13) vaccine.** / Consult your health care provider. 15. Pneumococcal polysaccharide (PPSV23) vaccine.** / 1 to 2 doses if you smoke cigarettes or if you have certain conditions. 16. Meningococcal vaccine.** / 1 dose if you are age 19 to 21 years and a   first-year college student living in a residence hall, or have one  of several medical conditions, you need to get vaccinated against meningococcal disease. You may also need additional booster doses. 17. Hepatitis A vaccine.** / Consult your health care provider. 18. Hepatitis B vaccine.** / Consult your health care provider. 19. Haemophilus influenzae type b (Hib) vaccine.** / Consult your health care provider.  24. Chlamydia, HIV, and other sexually transmitted diseases- Get screened every year until age 54, then within three months of each new sexual provider. 21. Pap Smear- Every 1-3 years; discuss with your health care provider. 28. Mammogram- Every year starting at age 40  Take these steps 1. Do not smoke-Your healthcare provider can help you quit.  For tips on how to quit go to www.smokefree.gov or call 1-800 QUITNOW. 2. Be physically active- Exercise 5 days a week for at least 30 minutes.  If you are not already physically active, start slow and gradually work up to 30 minutes of moderate physical activity.  Examples of moderate activity include walking briskly, dancing, swimming, bicycling, etc. 3. Breast Cancer- A self breast exam every month is important for early detection of breast cancer.  For more information and instruction on self breast exams, ask your healthcare provider or https://www.patel.info/. 4. Eat a healthy diet- Eat a variety of healthy foods such as fruits, vegetables, whole grains, low fat milk, low fat cheeses, yogurt, lean meats, poultry and fish, beans, nuts, tofu, etc.  For more information go to www. Thenutritionsource.org 5. Drink alcohol in moderation- Limit alcohol intake to one drink or less per day. Never drink and drive. 6. Depression- Your emotional health is as important as your physical health.  If you're feeling down or losing interest in things you normally enjoy please talk to your healthcare provider about being screened for depression. 7. Dental visit- Brush and floss your teeth twice daily;  visit your dentist twice a year. 8. Eye doctor- Get an eye exam at least every 2 years. 9. Helmet use- Always wear a helmet when riding a bicycle, motorcycle, rollerblading or skateboarding. 6. Safe sex- If you may be exposed to sexually transmitted infections, use a condom. 11. Seat belts- Seat belts can save your live; always wear one. 12. Smoke/Carbon Monoxide detectors- These detectors need to be installed on the appropriate level of your home. Replace batteries at least once a year. 13. Skin cancer- When out in the sun please cover up and use sunscreen 15 SPF or higher. 14. Violence- If anyone is threatening or hurting you, please tell your healthcare provider.

## 2015-06-29 LAB — URINALYSIS, ROUTINE W REFLEX MICROSCOPIC
BILIRUBIN URINE: NEGATIVE
GLUCOSE, UA: NEGATIVE
Hgb urine dipstick: NEGATIVE
KETONES UR: NEGATIVE
Leukocytes, UA: NEGATIVE
Nitrite: NEGATIVE
PH: 6 (ref 5.0–8.0)
Protein, ur: NEGATIVE
SPECIFIC GRAVITY, URINE: 1.026 (ref 1.001–1.035)

## 2015-06-29 LAB — VITAMIN D 25 HYDROXY (VIT D DEFICIENCY, FRACTURES): Vit D, 25-Hydroxy: 26 ng/mL — ABNORMAL LOW (ref 30–100)

## 2015-06-29 LAB — MICROALBUMIN / CREATININE URINE RATIO
Creatinine, Urine: 192 mg/dL (ref 20–320)
Microalb Creat Ratio: 2 mcg/mg creat (ref <30)
Microalb, Ur: 0.4 mg/dL

## 2015-06-29 LAB — HEMOGLOBIN A1C
HEMOGLOBIN A1C: 5.4 % (ref <5.7)
MEAN PLASMA GLUCOSE: 108 mg/dL (ref <117)

## 2015-08-05 ENCOUNTER — Encounter: Payer: Self-pay | Admitting: Physician Assistant

## 2015-08-07 ENCOUNTER — Emergency Department (HOSPITAL_COMMUNITY)
Admission: EM | Admit: 2015-08-07 | Discharge: 2015-08-08 | Disposition: A | Discharge status: Home / Self Care | Payer: BLUE CROSS/BLUE SHIELD | Attending: Emergency Medicine | Admitting: Emergency Medicine

## 2015-08-07 ENCOUNTER — Encounter (HOSPITAL_COMMUNITY): Payer: Self-pay | Admitting: Emergency Medicine

## 2015-08-07 DIAGNOSIS — R109 Unspecified abdominal pain: Secondary | ICD-10-CM

## 2015-08-07 DIAGNOSIS — Z8639 Personal history of other endocrine, nutritional and metabolic disease: Secondary | ICD-10-CM | POA: Diagnosis not present

## 2015-08-07 DIAGNOSIS — Z3202 Encounter for pregnancy test, result negative: Secondary | ICD-10-CM | POA: Diagnosis not present

## 2015-08-07 DIAGNOSIS — J45901 Unspecified asthma with (acute) exacerbation: Secondary | ICD-10-CM | POA: Insufficient documentation

## 2015-08-07 DIAGNOSIS — Z862 Personal history of diseases of the blood and blood-forming organs and certain disorders involving the immune mechanism: Secondary | ICD-10-CM | POA: Diagnosis not present

## 2015-08-07 DIAGNOSIS — R197 Diarrhea, unspecified: Secondary | ICD-10-CM | POA: Insufficient documentation

## 2015-08-07 DIAGNOSIS — Z8701 Personal history of pneumonia (recurrent): Secondary | ICD-10-CM | POA: Insufficient documentation

## 2015-08-07 DIAGNOSIS — R110 Nausea: Secondary | ICD-10-CM | POA: Diagnosis not present

## 2015-08-07 DIAGNOSIS — Z8742 Personal history of other diseases of the female genital tract: Secondary | ICD-10-CM | POA: Diagnosis not present

## 2015-08-07 LAB — COMPREHENSIVE METABOLIC PANEL
ALBUMIN: 3.9 g/dL (ref 3.5–5.0)
ALK PHOS: 50 U/L (ref 38–126)
ALT: 13 U/L — AB (ref 14–54)
ANION GAP: 10 (ref 5–15)
AST: 19 U/L (ref 15–41)
BILIRUBIN TOTAL: 0.3 mg/dL (ref 0.3–1.2)
BUN: 11 mg/dL (ref 6–20)
CALCIUM: 9.4 mg/dL (ref 8.9–10.3)
CO2: 25 mmol/L (ref 22–32)
CREATININE: 0.73 mg/dL (ref 0.44–1.00)
Chloride: 107 mmol/L (ref 101–111)
GFR calc Af Amer: >60 mL/min (ref >60)
GFR calc non Af Amer: >60 mL/min (ref >60)
GLUCOSE: 92 mg/dL (ref 65–99)
Potassium: 3.7 mmol/L (ref 3.5–5.1)
Sodium: 142 mmol/L (ref 135–145)
TOTAL PROTEIN: 7 g/dL (ref 6.5–8.1)

## 2015-08-07 LAB — CBC
HCT: 37.1 % (ref 36.0–46.0)
HEMOGLOBIN: 12.2 g/dL (ref 12.0–15.0)
MCH: 29.5 pg (ref 26.0–34.0)
MCHC: 32.9 g/dL (ref 30.0–36.0)
MCV: 89.6 fL (ref 78.0–100.0)
PLATELETS: 235 10*3/uL (ref 150–400)
RBC: 4.14 MIL/uL (ref 3.87–5.11)
RDW: 12.7 % (ref 11.5–15.5)
WBC: 11.5 10*3/uL — ABNORMAL HIGH (ref 4.0–10.5)

## 2015-08-07 LAB — URINALYSIS, ROUTINE W REFLEX MICROSCOPIC
BILIRUBIN URINE: NEGATIVE
Glucose, UA: NEGATIVE mg/dL
HGB URINE DIPSTICK: NEGATIVE
KETONES UR: NEGATIVE mg/dL
Leukocytes, UA: NEGATIVE
NITRITE: NEGATIVE
PROTEIN: NEGATIVE mg/dL
SPECIFIC GRAVITY, URINE: 1.023 (ref 1.005–1.030)
pH: 6 (ref 5.0–8.0)

## 2015-08-07 LAB — PREGNANCY, URINE: PREG TEST UR: NEGATIVE

## 2015-08-07 LAB — LIPASE, BLOOD: Lipase: 39 U/L (ref 11–51)

## 2015-08-07 NOTE — ED Provider Notes (Signed)
CSN: [id number]     Arrival date & time 08/07/15  2020 History   First MD Initiated Contact with Patient 08/07/15 2253     Chief Complaint  Patient presents with  . Abdominal Pain     (Consider location/radiation/quality/duration/timing/severity/associated sxs/prior Treatment) HPI Comments: Patient with a history of asthma presents for progressively worsening right sided abdominal pain x 2 months, associated with nausea and 2-3/day diarrhea that is non-bloody. No vomiting or fever. The pain is worse with eating any type of food. One week ago, symptoms started to include SOB and patient was seen at Urgent Care for evaluation of asthma flare. She was re-started on her Sinulair, given an inhaler and a taper dose of prednisone. She did not notice any change to her symptoms. She presents tonight with sharp increase to pain in right lateral upper abdomen.   Patient is a 35 y.o. female presenting with abdominal pain. The history is provided by the patient. No language interpreter was used.  Abdominal Pain Associated symptoms: diarrhea, nausea and shortness of breath   Associated symptoms: no chest pain, no cough, no dysuria, no fever and no vomiting     Past Medical History  Diagnosis Date  . Fibrocystic breast   . Vitamin D deficiency   . Seasonal allergies   . Pneumonia     age 79  . Asthma     no problems since pregnancy  . Anemia     hx of  . PONV (postoperative nausea and vomiting)   . Family history of anesthesia complication     "mom is sensitive- severe n/v"   Past Surgical History  Procedure Laterality Date  . Cesarean section  07/29/2009  . Wisdom tooth extraction  2002  . Cholecystectomy N/A 09/25/2013    Procedure: LAPAROSCOPIC CHOLECYSTECTOMY WITH INTRAOPERATIVE CHOLANGIOGRAM;  Surgeon: Pedro Earls, MD;  Location: [facility];  Service: General;  Laterality: N/A;   Family History  Problem Relation Age of Onset  . Hypertension Mother   . Diabetes Father   . Cancer Father      prostate  . Cancer Other     breast  . Hypertension Other   . Heart disease Other    Social History  Substance Use Topics  . Smoking status: Never Smoker   . Smokeless tobacco: Never Used  . Alcohol Use: Yes     Comment: rare   OB History    No data available     Review of Systems  Constitutional: Negative for fever.  Respiratory: Positive for shortness of breath. Negative for cough.   Cardiovascular: Negative for chest pain.  Gastrointestinal: Positive for nausea, abdominal pain and diarrhea. Negative for vomiting.  Genitourinary: Negative for dysuria.  Musculoskeletal: Negative for myalgias.  Skin: Negative for rash.      Allergies  Review of patient's allergies indicates no known allergies.  Home Medications   Prior to Admission medications   Medication Sig Start Date End Date Taking? Authorizing Provider  levonorgestrel (MIRENA) 20 MCG/24HR IUD 1 each by Intrauterine route once. 09/18/09  Yes Historical Provider, MD  montelukast (SINGULAIR) 10 MG tablet TAKE 1 TABLET BY MOUTH EVERY DAY 01/18/15  Yes Unk Pinto, MD  PROAIR HFA 108 [phone] Base) MCG/ACT inhaler Inhale 1-2 puffs into the lungs every 4 hours as needed for wheezing or shortness of breath. 05/18/15  Yes Unk Pinto, MD  azithromycin (ZITHROMAX Z-PAK) 250 MG tablet 2 po day one, then 1 daily x 4 days Patient not taking: Reported on  06/28/2015 05/10/15   Courtney Forcucci, PA-C  buPROPion (WELLBUTRIN XL) 150 MG 24 hr tablet TAKE 1 TABLET BY MOUTH EVERY MORNING Patient not taking: Reported on 08/07/2015 10/11/14   Vicie Mutters, PA-C  fluconazole (DIFLUCAN) 150 MG tablet TAKE ONE TABLET BY MOUTH ONCE WEEKLY. Patient not taking: Reported on 06/28/2015    Vicie Mutters, PA-C  fluticasone [facility]) 50 MCG/ACT nasal spray Place 2 sprays into both nostrils daily. Patient not taking: Reported on 08/07/2015 05/10/15   Starlyn Skeans, PA-C  predniSONE (DELTASONE) 20 MG tablet 3 tabs po daily x 3 days, then 2  tabs x 3 days, then 1.5 tabs x 3 days, then 1 tab x 3 days, then 0.5 tabs x 3 days Patient not taking: Reported on 06/28/2015 05/10/15   Loma Sousa Forcucci, PA-C  promethazine (PHENERGAN) 25 MG tablet TAKE ONE TABLET BY MOUTH EVERY 4 HOURS AS NEEDED FOR NAUSEA AND VOMITING Patient not taking: Reported on 06/28/2015 08/21/14   Vicie Mutters, PA-C  promethazine-dextromethorphan (PROMETHAZINE-DM) 6.25-15 MG/5ML syrup Take 5-10 mL PO q8hrs prn for cold symptoms. Patient not taking: Reported on 06/28/2015 05/10/15   Starlyn Skeans, PA-C  valACYclovir (VALTREX) 1000 MG tablet TAKE TWO TABLETS BY MOUTH TWICE DAILY Patient not taking: Reported on 08/07/2015 04/15/14   Vicie Mutters, PA-C   BP 138/87 mmHg  Pulse 92  Temp(Src) 99.3 F (37.4 C) (Oral)  Resp 18  Ht 5\' 6"  (1.676 m)  Wt 64.864 kg  BMI 23.09 kg/m2  SpO2 100% Physical Exam  Constitutional: She is oriented to person, place, and time. She appears well-developed and well-nourished.  HENT:  Head: Normocephalic.  Neck: Normal range of motion. Neck supple.  Cardiovascular: Normal rate and regular rhythm.   Pulmonary/Chest: Effort normal and breath sounds normal. She has no wheezes. She has no rales.  Abdominal: Soft. Bowel sounds are normal. There is tenderness (Tenderness is mild). There is no rebound and no guarding.    Musculoskeletal: Normal range of motion.  Neurological: She is alert and oriented to person, place, and time.  Skin: Skin is warm and dry. No rash noted.  Psychiatric: She has a normal mood and affect.    ED Course  Procedures (including critical care time) Labs Review Labs Reviewed  COMPREHENSIVE METABOLIC PANEL - Abnormal; Notable for the following:    ALT 13 (*)    All other components within normal limits  CBC - Abnormal; Notable for the following:    WBC 11.5 (*)    All other components within normal limits  LIPASE, BLOOD  URINALYSIS, ROUTINE W REFLEX MICROSCOPIC (NOT AT [facility])  PREGNANCY, URINE   Results  for orders placed or performed during the hospital encounter of 08/07/15  Lipase, blood  Result Value Ref Range   Lipase 39 11 - 51 U/L  Comprehensive metabolic panel  Result Value Ref Range   Sodium 142 135 - 145 mmol/L   Potassium 3.7 3.5 - 5.1 mmol/L   Chloride 107 101 - 111 mmol/L   CO2 25 22 - 32 mmol/L   Glucose, Bld 92 65 - 99 mg/dL   BUN 11 6 - 20 mg/dL   Creatinine, Ser 0.73 0.44 - 1.00 mg/dL   Calcium 9.4 8.9 - 10.3 mg/dL   Total Protein 7.0 6.5 - 8.1 g/dL   Albumin 3.9 3.5 - 5.0 g/dL   AST 19 15 - 41 U/L   ALT 13 (L) 14 - 54 U/L   Alkaline Phosphatase 50 38 - 126 U/L   Total Bilirubin 0.3  0.3 - 1.2 mg/dL   GFR calc non Af Amer >60 >60 mL/min   GFR calc Af Amer >60 >60 mL/min   Anion gap 10 5 - 15  CBC  Result Value Ref Range   WBC 11.5 (H) 4.0 - 10.5 K/uL   RBC 4.14 3.87 - 5.11 MIL/uL   Hemoglobin 12.2 12.0 - 15.0 g/dL   HCT 37.1 36.0 - 46.0 %   MCV 89.6 78.0 - 100.0 fL   MCH 29.5 26.0 - 34.0 pg   MCHC 32.9 30.0 - 36.0 g/dL   RDW 12.7 11.5 - 15.5 %   Platelets 235 150 - 400 K/uL  Urinalysis, Routine w reflex microscopic (not at [facility])  Result Value Ref Range   Color, Urine YELLOW YELLOW   APPearance CLEAR CLEAR   Specific Gravity, Urine 1.023 1.005 - 1.030   pH 6.0 5.0 - 8.0   Glucose, UA NEGATIVE NEGATIVE mg/dL   Hgb urine dipstick NEGATIVE NEGATIVE   Bilirubin Urine NEGATIVE NEGATIVE   Ketones, ur NEGATIVE NEGATIVE mg/dL   Protein, ur NEGATIVE NEGATIVE mg/dL   Nitrite NEGATIVE NEGATIVE   Leukocytes, UA NEGATIVE NEGATIVE  Pregnancy, urine  Result Value Ref Range   Preg Test, Ur NEGATIVE NEGATIVE   Ct Abdomen Pelvis W Contrast  08/08/2015  CLINICAL DATA:  Right upper and lower abdominal pain. Nausea and diarrhea for 2 months. EXAM: CT ABDOMEN AND PELVIS WITH CONTRAST TECHNIQUE: Multidetector CT imaging of the abdomen and pelvis was performed using the standard protocol following bolus administration of intravenous contrast. CONTRAST:  100 mL Isovue-300  COMPARISON:  08/24/2010 FINDINGS: Mild pectus excavatum.  Lung bases are clear. Surgical absence of the gallbladder. No bile duct dilatation. The liver, spleen, pancreas, adrenal glands, kidneys, abdominal aorta, inferior vena cava, and retroperitoneal lymph nodes are unremarkable. Stomach and small bowel are decompressed, limiting evaluation. Scattered stool throughout the colon without abnormal distention. Small periumbilical hernia containing fat. No free air or free fluid in the abdomen. Pelvis: Appendix is normal. Intrauterine device is present. Uterus and ovaries are not enlarged. Small amount of free fluid in the pelvis is likely physiologic. Bladder wall is not thickened. No evidence of diverticulitis. No free or loculated pelvic fluid collections. No pelvic mass or lymphadenopathy. No destructive bone lesions. IMPRESSION: No acute process demonstrated in the abdomen or pelvis. No evidence of bowel obstruction. Electronically Signed   By: Lucienne Capers M.D.   On: 08/08/2015 02:40     Imaging Review No results found. I have personally reviewed and evaluated these images and lab results as part of my medical decision-making.   EKG Interpretation None      MDM   Final diagnoses:  None    1. Abdominal pain 2. Dyspnea  Presents with progressively worsening abdominal pain laterally x 2 months, with diarrhea and now with SOB.   11:30: patient is evaluated and given the option of CT scanning tonight vs follow up with PCP tomorrow as planned. She would like the CT performed tonight  1:00 - re-check: the patient continues to decline pain or comfort medications. CT pending.   3:00 - CT scan without acute finding. The patient is now asking for something for pain - Toradol provided. Discussed results and need for outpatient follow up. She declines home medications. Stable for discharge home.   Charlann Lange, PA-C 08/08/15 0301  Davonna Belling, MD 08/08/15 [id number]

## 2015-08-07 NOTE — ED Notes (Signed)
PA at bedside.

## 2015-08-07 NOTE — ED Notes (Signed)
Pt. reports intermittent right abdominal pain with nausea and occasional diarrhea onset 2 months ago , denies fever or chills.

## 2015-08-08 ENCOUNTER — Encounter: Payer: Self-pay | Admitting: Physician Assistant

## 2015-08-08 ENCOUNTER — Emergency Department (HOSPITAL_COMMUNITY): Payer: BLUE CROSS/BLUE SHIELD

## 2015-08-08 ENCOUNTER — Ambulatory Visit (INDEPENDENT_AMBULATORY_CARE_PROVIDER_SITE_OTHER): Payer: BLUE CROSS/BLUE SHIELD | Admitting: Physician Assistant

## 2015-08-08 VITALS — BP 100/68 | HR 81 | Temp 97.0°F | Resp 16 | Ht 65.0 in | Wt 143.0 lb

## 2015-08-08 DIAGNOSIS — A09 Infectious gastroenteritis and colitis, unspecified: Secondary | ICD-10-CM | POA: Diagnosis not present

## 2015-08-08 DIAGNOSIS — J45901 Unspecified asthma with (acute) exacerbation: Secondary | ICD-10-CM | POA: Diagnosis not present

## 2015-08-08 DIAGNOSIS — R197 Diarrhea, unspecified: Secondary | ICD-10-CM

## 2015-08-08 DIAGNOSIS — T7840XD Allergy, unspecified, subsequent encounter: Secondary | ICD-10-CM

## 2015-08-08 LAB — CBC WITH DIFFERENTIAL/PLATELET
BASOS ABS: 88 {cells}/uL (ref 0–200)
BASOS PCT: 1 %
EOS ABS: 176 {cells}/uL (ref 15–500)
EOS PCT: 2 %
HCT: 38.7 % (ref 35.0–45.0)
HEMOGLOBIN: 12.9 g/dL (ref 11.7–15.5)
LYMPHS ABS: 2112 {cells}/uL (ref 850–3900)
Lymphocytes Relative: 24 %
MCH: 29.9 pg (ref 27.0–33.0)
MCHC: 33.3 g/dL (ref 32.0–36.0)
MCV: 89.8 fL (ref 80.0–100.0)
MONOS PCT: 7 %
MPV: 10.6 fL (ref 7.5–12.5)
Monocytes Absolute: 616 cells/uL (ref 200–950)
NEUTROS ABS: 5808 {cells}/uL (ref 1500–7800)
Neutrophils Relative %: 66 %
PLATELETS: 265 10*3/uL (ref 140–400)
RBC: 4.31 MIL/uL (ref 3.80–5.10)
RDW: 13 % (ref 11.0–15.0)
WBC: 8.8 10*3/uL (ref 3.8–10.8)

## 2015-08-08 LAB — SEDIMENTATION RATE: Sed Rate: 4 mm/hr (ref 0–20)

## 2015-08-08 MED ORDER — FLUTICASONE FUROATE-VILANTEROL 100-25 MCG/INH IN AEPB: 1.0000 | INHALATION_SPRAY | Freq: Every day | RESPIRATORY_TRACT | Status: DC

## 2015-08-08 MED ORDER — ALBUTEROL SULFATE (2.5 MG/3ML) 0.083% IN NEBU: 2.5000 mg | INHALATION_SOLUTION | Freq: Four times a day (QID) | RESPIRATORY_TRACT | Status: DC | PRN

## 2015-08-08 MED ORDER — IOPAMIDOL (ISOVUE-300) INJECTION 61%
INTRAVENOUS | Status: AC
  Filled 2015-08-08: qty 100

## 2015-08-08 MED ORDER — KETOROLAC TROMETHAMINE 30 MG/ML IJ SOLN
30.0000 mg | Freq: Once | INTRAMUSCULAR | Status: AC
  Administered 2015-08-08: 30 mg via INTRAVENOUS
  Filled 2015-08-08: qty 1

## 2015-08-08 MED ORDER — DICYCLOMINE HCL 10 MG PO CAPS: 10.0000 mg | ORAL_CAPSULE | Freq: Four times a day (QID) | ORAL | Status: DC

## 2015-08-08 MED ORDER — IOPAMIDOL (ISOVUE-300) INJECTION 61%
100.0000 mL | Freq: Once | INTRAVENOUS | Status: AC | PRN
  Administered 2015-08-08: 100 mL via INTRAVENOUS

## 2015-08-08 NOTE — Progress Notes (Signed)
Subjective:    Patient ID: Christie Le, female    DOB: 03-Mar-1981, 35 y.o.   MRN: [id number]  HPI 35 y.o. WF with history of asthma, c/p choley presents with asthma exacerbation and diarrhea. Went to urgent care, treated with prednisone, started back on zyrtec and singulair.  Most recently has had AB pain and diarrhea with cramping, nausea x several months, getting progressively worse x 14month, has been having low grade temperature. Went to ER last night, had normal CT AB, normal urine, kidney, liver. Zpak in Jan, ibuprofen rarely, toradol last night that helps,   Blood pressure 100/68, pulse 81, temperature 97 F (36.1 C), temperature source Temporal, resp. rate 16, height 5\' 5"  (1.651 m), weight 143 lb (64.864 kg), SpO2 98 %.  Past Medical History  Diagnosis Date  . Fibrocystic breast   . Vitamin D deficiency   . Seasonal allergies   . Pneumonia     age 35  . Asthma     no problems since pregnancy  . Anemia     hx of  . PONV (postoperative nausea and vomiting)   . Family history of anesthesia complication     "mom is sensitive- severe n/v"   Current Outpatient Prescriptions on File Prior to Visit  Medication Sig Dispense Refill  . buPROPion (WELLBUTRIN XL) 150 MG 24 hr tablet TAKE 1 TABLET BY MOUTH EVERY MORNING 30 tablet 3  . levonorgestrel (MIRENA) 20 MCG/24HR IUD 1 each by Intrauterine route once.    . montelukast (SINGULAIR) 10 MG tablet TAKE 1 TABLET BY MOUTH EVERY DAY 90 tablet 99  . PROAIR HFA 108 (90 Base) MCG/ACT inhaler Inhale 1-2 puffs into the lungs every 4 hours as needed for wheezing or shortness of breath. 18 g 99  . azithromycin (ZITHROMAX Z-PAK) 250 MG tablet 2 po day one, then 1 daily x 4 days (Patient not taking: Reported on 06/28/2015) 6 tablet 0  . fluconazole (DIFLUCAN) 150 MG tablet TAKE ONE TABLET BY MOUTH ONCE WEEKLY. (Patient not taking: Reported on 06/28/2015) 4 tablet 0  . fluticasone (FLONASE) 50 MCG/ACT nasal spray Place 2 sprays into both nostrils  daily. (Patient not taking: Reported on 08/07/2015) 16 g 2  . predniSONE (DELTASONE) 20 MG tablet 3 tabs po daily x 3 days, then 2 tabs x 3 days, then 1.5 tabs x 3 days, then 1 tab x 3 days, then 0.5 tabs x 3 days (Patient not taking: Reported on 06/28/2015) 27 tablet 0  . promethazine (PHENERGAN) 25 MG tablet TAKE ONE TABLET BY MOUTH EVERY 4 HOURS AS NEEDED FOR NAUSEA AND VOMITING (Patient not taking: Reported on 06/28/2015) 30 tablet 1  . promethazine-dextromethorphan (PROMETHAZINE-DM) 6.25-15 MG/5ML syrup Take 5-10 mL PO q8hrs prn for cold symptoms. (Patient not taking: Reported on 06/28/2015) 360 mL 1  . valACYclovir (VALTREX) 1000 MG tablet TAKE TWO TABLETS BY MOUTH TWICE DAILY (Patient not taking: Reported on 08/07/2015) 360 tablet 0   No current facility-administered medications on file prior to visit.     Review of Systems  Constitutional: Positive for fever. Negative for chills, diaphoresis, activity change, appetite change, fatigue and unexpected weight change.  HENT: Positive for congestion and rhinorrhea. Negative for ear discharge, ear pain, hearing loss, nosebleeds, sore throat and tinnitus.   Eyes: Negative.   Respiratory: Positive for chest tightness, shortness of breath and wheezing. Negative for cough and stridor.   Cardiovascular: Negative.   Gastrointestinal: Positive for nausea, abdominal pain and diarrhea. Negative for vomiting, constipation and  blood in stool.  Genitourinary: Negative.   Musculoskeletal: Negative.   Skin: Negative.   Neurological: Negative.  Negative for weakness and headaches.  Psychiatric/Behavioral: Negative.        Objective:   Physical Exam  Constitutional: She appears well-developed and well-nourished. No distress.  Neck: Normal range of motion. Neck supple.  Cardiovascular: Normal rate, regular rhythm and normal heart sounds.   No murmur heard. Pulmonary/Chest: Effort normal. No accessory muscle usage. No respiratory distress. She has decreased  breath sounds (decreased air movement). She has wheezes (light expiratory).  Abdominal: Soft. Bowel sounds are normal. She exhibits no distension and no mass. There is tenderness (mild) in the right lower quadrant. There is no rigidity, no rebound, no guarding, no CVA tenderness, no tenderness at McBurney's point and negative Murphy's sign.  Lymphadenopathy:    She has no cervical adenopathy.  Skin: She is not diaphoretic.       Assessment & Plan:  1.Asthma exacerbation - fluticasone furoate-vilanterol (BREO ELLIPTA) 100-25 MCG/INH AEPB; Inhale 1 puff into the lungs daily. Rinse mouth with water after each use  Dispense: 1 each; Refill: 4 - albuterol (PROVENTIL) (2.5 MG/3ML) 0.083% nebulizer solution; Take 3 mLs (2.5 mg total) by nebulization every 6 (six) hours as needed for wheezing or shortness of breath.  Dispense: 75 mL; Refill: 12  2. Diarrhea of presumed infectious origin X SEVERAL MONTHS Benign AB and had normal CT Rule out infectious cause/celic, given bentyl, may start on IBS medication if labs normal or will refer to GI - Helicobacter pylori special antigen - Celiac panel - Gastrointestinal Pathogen Panel PCR; Future - Sedimentation rate - CBC with Differential/Platelet - dicyclomine (BENTYL) 10 MG capsule; Take 1 capsule (10 mg total) by mouth 4 (four) times daily.  Dispense: 60 capsule; Refill: 1

## 2015-08-08 NOTE — Discharge Instructions (Signed)

## 2015-08-08 NOTE — ED Notes (Signed)
Patient transported to CT 

## 2015-08-08 NOTE — Patient Instructions (Signed)

## 2015-08-09 LAB — GLIA (IGA/G) + TTG IGA
Deamidated Gliadin Abs, IgG: 2 U
Gliadin IgA: 6 U
Tissue Transglutaminase Ab, IgA: 1 U/mL

## 2015-08-09 NOTE — Addendum Note (Signed)
Addended by: Eulis Canner on: 08/09/2015 10:56 AM   Modules accepted: Orders

## 2015-08-12 LAB — HELICOBACTER PYLORI  SPECIAL ANTIGEN: H. PYLORI Antigen: NOT DETECTED

## 2015-08-14 LAB — GASTROINTESTINAL PATHOGEN PANEL PCR
C. difficile Tox A/B, PCR: NOT DETECTED
Campylobacter, PCR: NOT DETECTED
Cryptosporidium, PCR: NOT DETECTED
E COLI (STEC) STX1/STX2, PCR: NOT DETECTED
E COLI 0157, PCR: NOT DETECTED
E coli (ETEC) LT/ST PCR: NOT DETECTED
Giardia lamblia, PCR: NOT DETECTED
Norovirus, PCR: NOT DETECTED
Rotavirus A, PCR: NOT DETECTED
SALMONELLA, PCR: NOT DETECTED
SHIGELLA, PCR: NOT DETECTED

## 2015-08-21 IMAGING — CR DG CHEST 2V
2 series · 2 of 2 positions shown · non-contrast
Comparison: 07/31/2007

CLINICAL DATA: Pre-op.  History of asthma and pneumonia.

EXAM:
CHEST  2 VIEW

[w chest pa]
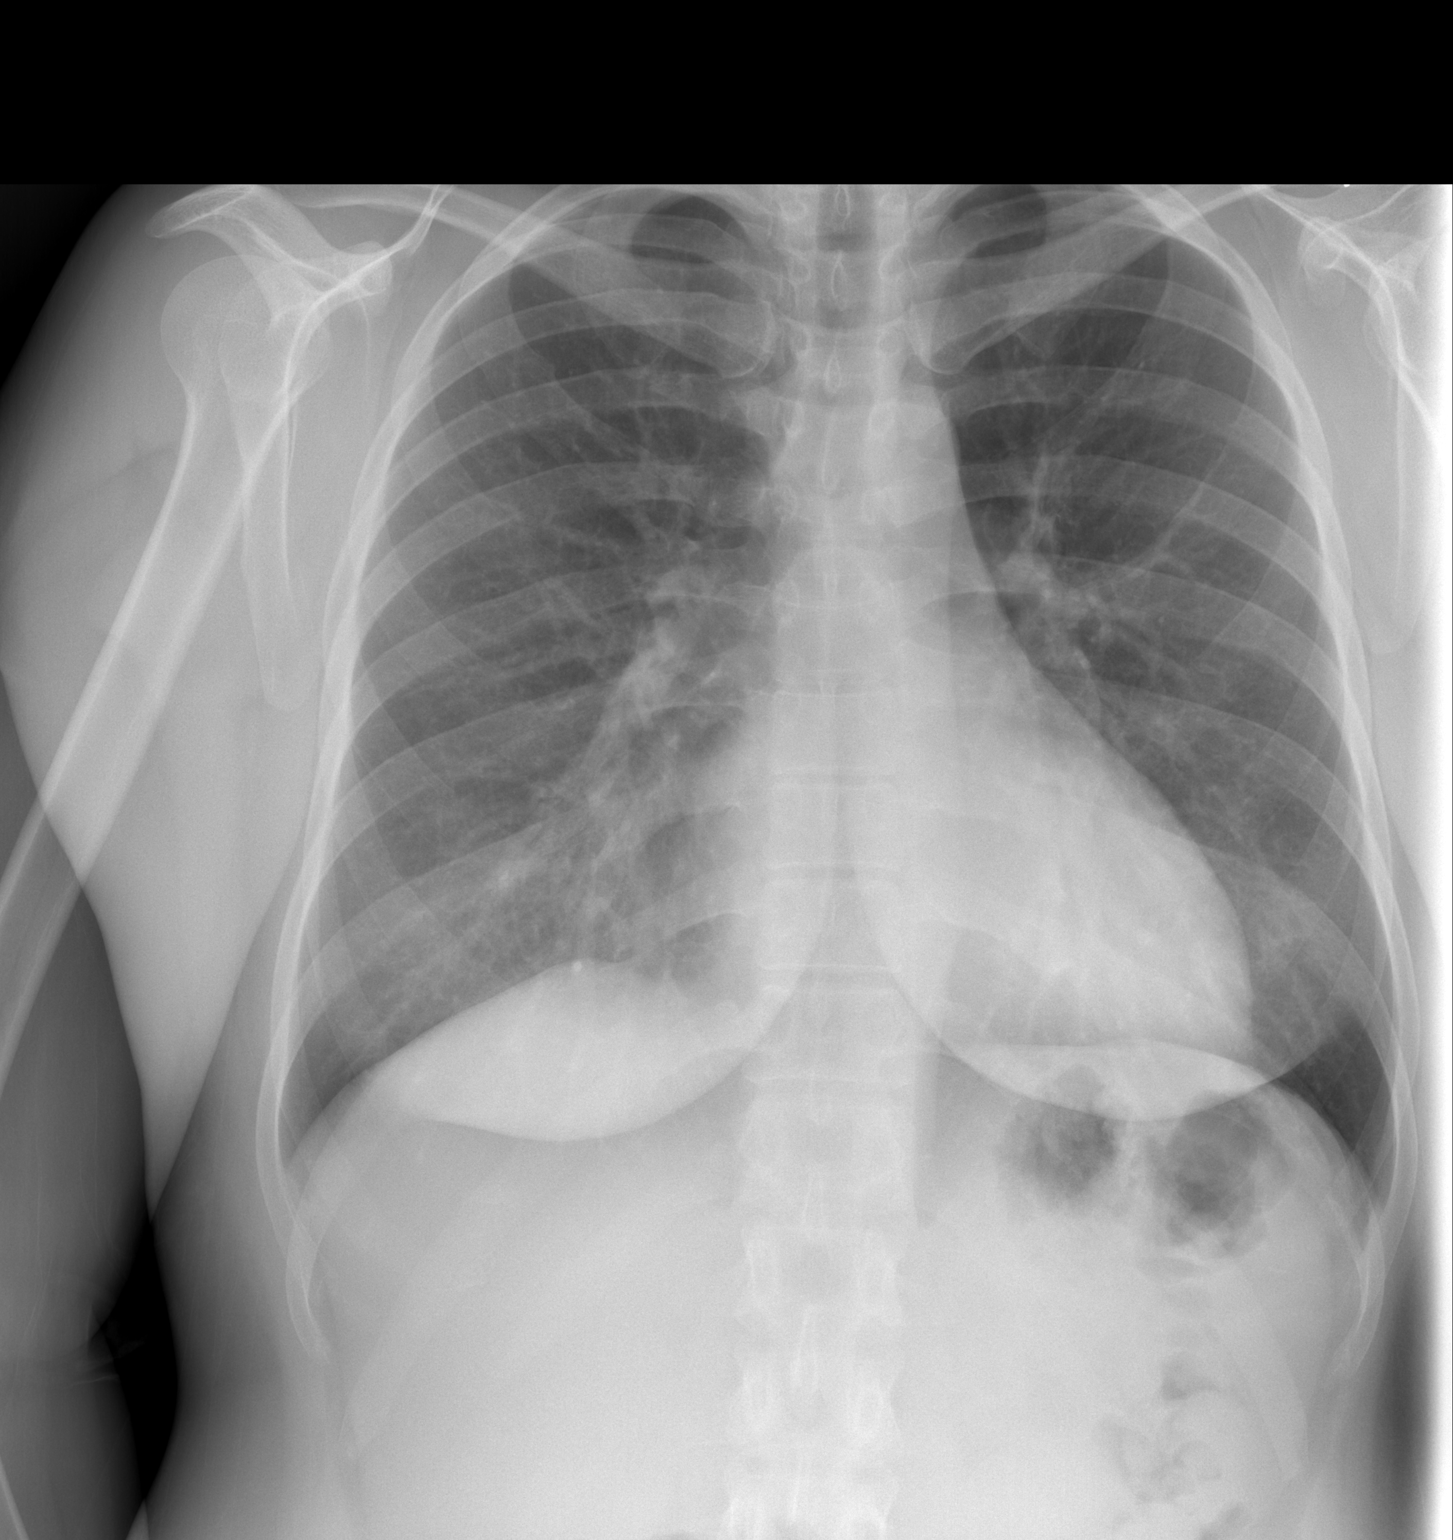

[w chest lat]
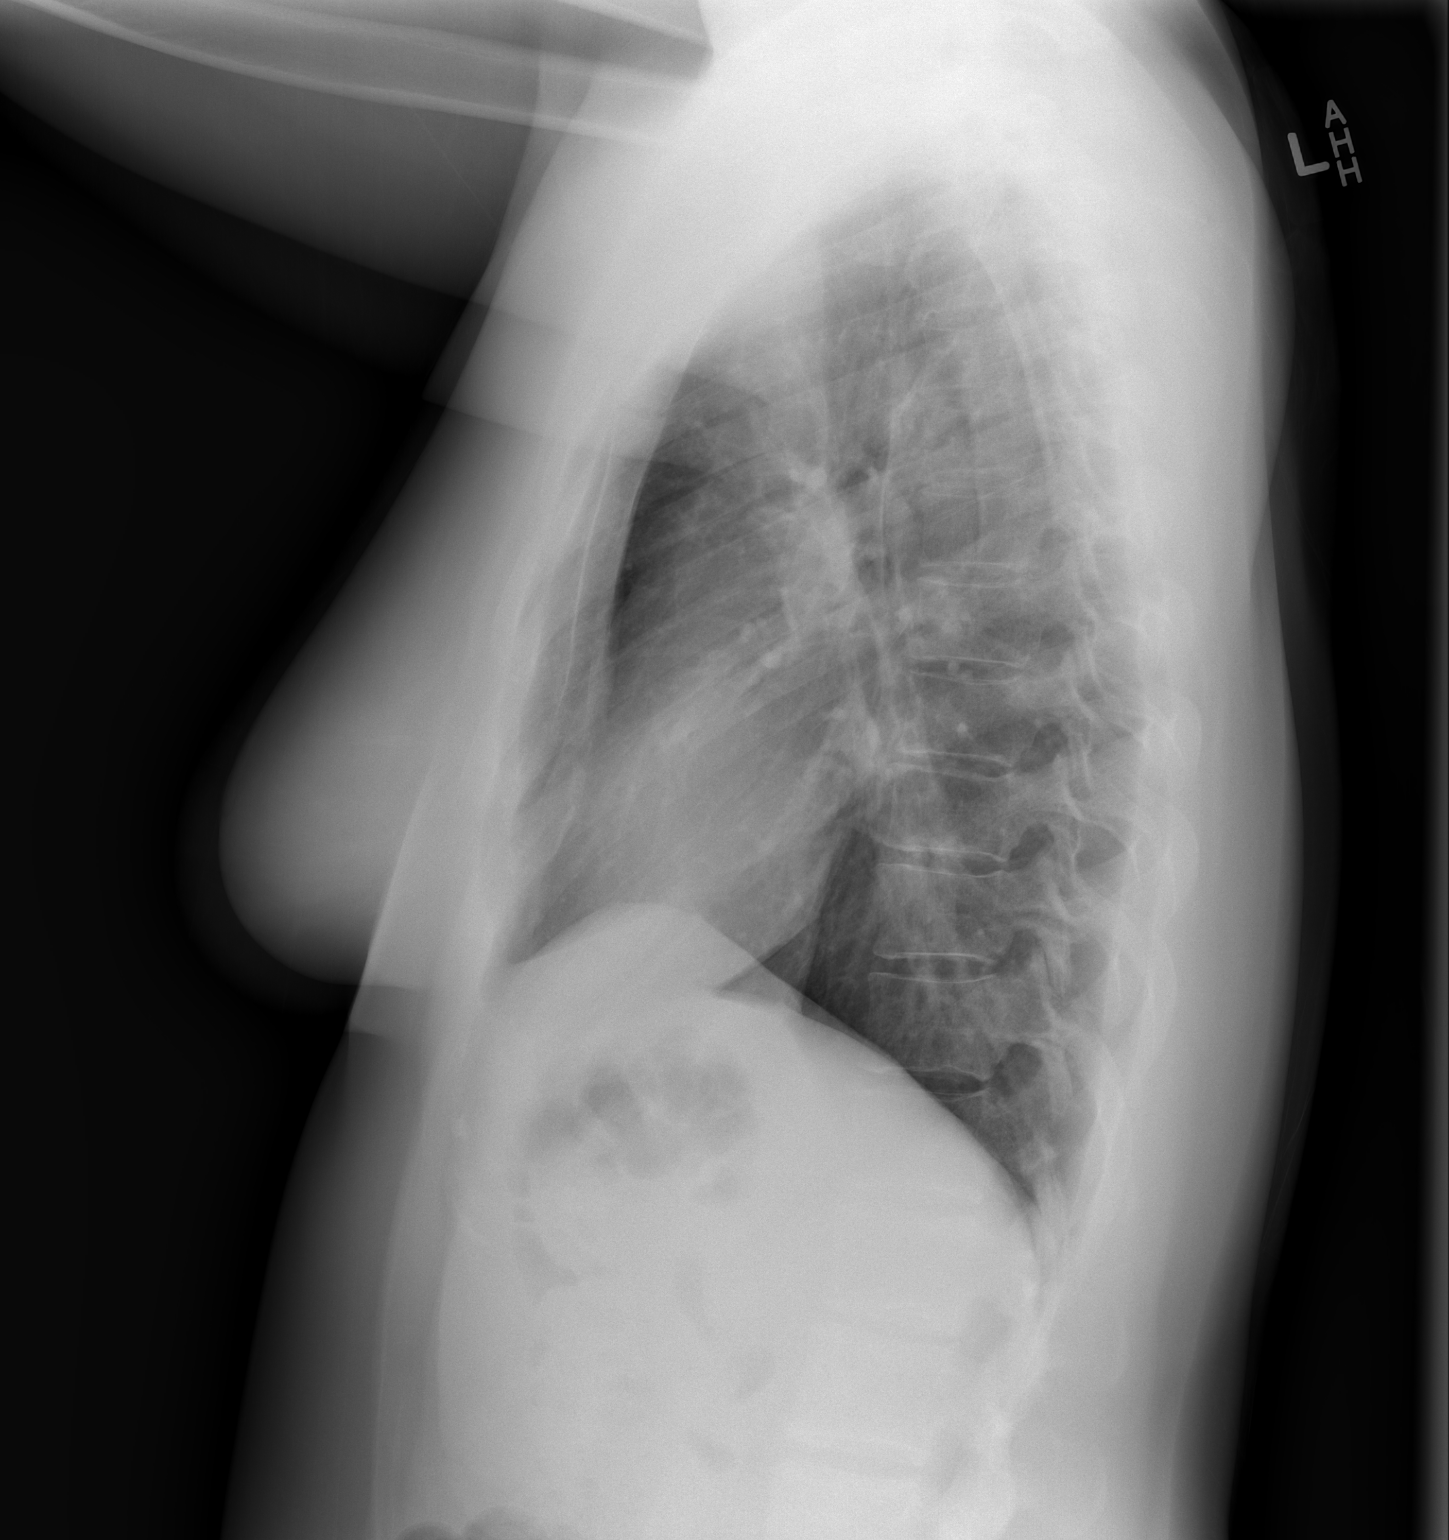

[2 of 2 positions shown; findings below may reference images not displayed]

FINDINGS: Two views of the chest demonstrate clear lungs. Slightly prominent
lung markings in the right lower chest are stable. Heart size is
normal. No focal airspace disease or edema. Trachea is midline. No
acute bone abnormality.
IMPRESSION: No active cardiopulmonary disease.

## 2015-10-14 ENCOUNTER — Other Ambulatory Visit: Payer: Self-pay | Admitting: Physician Assistant

## 2015-10-15 ENCOUNTER — Other Ambulatory Visit: Payer: Self-pay | Admitting: Physician Assistant

## 2015-10-15 DIAGNOSIS — R05 Cough: Secondary | ICD-10-CM

## 2015-10-15 DIAGNOSIS — R059 Cough, unspecified: Secondary | ICD-10-CM

## 2015-10-15 MED ORDER — BUPROPION HCL ER (XL) 150 MG PO TB24: 300.0000 mg | ORAL_TABLET | Freq: Every morning | ORAL | Status: DC

## 2015-11-12 ENCOUNTER — Other Ambulatory Visit: Payer: Self-pay | Admitting: Physician Assistant

## 2015-11-13 ENCOUNTER — Other Ambulatory Visit: Payer: Self-pay | Admitting: Physician Assistant

## 2015-12-09 ENCOUNTER — Other Ambulatory Visit: Payer: Self-pay | Admitting: Physician Assistant

## 2016-01-01 ENCOUNTER — Encounter: Payer: Self-pay | Admitting: Internal Medicine

## 2016-01-01 ENCOUNTER — Ambulatory Visit (INDEPENDENT_AMBULATORY_CARE_PROVIDER_SITE_OTHER): Payer: BLUE CROSS/BLUE SHIELD | Admitting: Internal Medicine

## 2016-01-01 VITALS — BP 122/76 | HR 92 | Temp 98.2°F | Resp 16 | Ht 65.0 in

## 2016-01-01 DIAGNOSIS — J029 Acute pharyngitis, unspecified: Secondary | ICD-10-CM

## 2016-01-01 MED ORDER — AZITHROMYCIN 250 MG PO TABS: ORAL_TABLET | ORAL | 0 refills | Status: DC

## 2016-01-01 MED ORDER — PREDNISONE 20 MG PO TABS: ORAL_TABLET | ORAL | 0 refills | Status: DC

## 2016-01-01 MED ORDER — LIDOCAINE VISCOUS 2 % MT SOLN: 20.0000 mL | OROMUCOSAL | 0 refills | Status: DC | PRN

## 2016-01-01 NOTE — Progress Notes (Signed)
   Subjective:    Patient ID: Christie Le, female    DOB: 1980/07/02, 35 y.o.   MRN: [id number]  HPI  Patient presents to the office for evaluation of sore throat and fever which has been going on since Monday.  She reports that she had a subjective fever which she thinks broke.  She did check her temperatures and she was able to get up to the 101 and 102.  She was alternating tylenol and ibuprofen.  She has had one daughter who was sick with a fever.  She reports that her kids have been exposed to strep but she has not.    Review of Systems  Constitutional: Positive for chills and fever. Negative for fatigue.  HENT: Positive for congestion, ear pain, postnasal drip, rhinorrhea, sinus pressure and sore throat. Negative for ear discharge.   Respiratory: Positive for cough and chest tightness. Negative for shortness of breath and wheezing.        Objective:   Physical Exam  Constitutional: She appears well-developed and well-nourished. No distress.  HENT:  Head: Normocephalic.  Right Ear: A middle ear effusion is present.  Left Ear: A middle ear effusion is present.  Nose: Mucosal edema present.  Mouth/Throat: Mucous membranes are normal. No trismus in the jaw. Posterior oropharyngeal erythema present. No oropharyngeal exudate, posterior oropharyngeal edema or tonsillar abscesses.  Eyes: Conjunctivae are normal. No scleral icterus.  Neck: Normal range of motion. Neck supple. No JVD present. No thyromegaly present.  Cardiovascular: Normal rate, regular rhythm, normal heart sounds and intact distal pulses.  Exam reveals no gallop and no friction rub.   No murmur heard. Pulmonary/Chest: Effort normal and breath sounds normal. No respiratory distress. She has no wheezes. She has no rales. She exhibits no tenderness.  Lymphadenopathy:    She has no cervical adenopathy.  Skin: She is not diaphoretic.  Nursing note and vitals reviewed.   Vitals:   01/01/16 1630  BP: 122/76  Pulse: 92   Resp: 16  Temp: 98.2 F (36.8 C)          Assessment & Plan:    1. Acute pharyngitis, unspecified etiology -cont antihistamine -use phenergan dm at home as needed - lidocaine (XYLOCAINE) 2 % solution; Use as directed 20 mLs in the mouth or throat as needed for mouth pain.  Dispense: 100 mL; Refill: 0 - azithromycin (ZITHROMAX Z-PAK) 250 MG tablet; 2 po day one, then 1 daily x 4 days  Dispense: 6 tablet; Refill: 0 - predniSONE (DELTASONE) 20 MG tablet; 3 tabs po daily x 3 days, then 2 tabs x 3 days, then 1.5 tabs x 3 days, then 1 tab x 3 days, then 0.5 tabs x 3 days  Dispense: 27 tablet; Refill: 0

## 2016-01-09 ENCOUNTER — Other Ambulatory Visit: Payer: Self-pay | Admitting: Physician Assistant

## 2016-01-28 ENCOUNTER — Other Ambulatory Visit: Payer: Self-pay | Admitting: Physician Assistant

## 2016-02-10 ENCOUNTER — Other Ambulatory Visit: Payer: Self-pay | Admitting: Physician Assistant

## 2016-02-18 ENCOUNTER — Ambulatory Visit (INDEPENDENT_AMBULATORY_CARE_PROVIDER_SITE_OTHER): Payer: BLUE CROSS/BLUE SHIELD | Admitting: Physician Assistant

## 2016-02-18 ENCOUNTER — Encounter: Payer: Self-pay | Admitting: Physician Assistant

## 2016-02-18 VITALS — BP 122/80 | HR 82 | Temp 98.9°F | Resp 16 | Ht 65.0 in | Wt 151.8 lb

## 2016-02-18 DIAGNOSIS — G5622 Lesion of ulnar nerve, left upper limb: Secondary | ICD-10-CM | POA: Diagnosis not present

## 2016-02-18 MED ORDER — PREDNISONE 20 MG PO TABS: ORAL_TABLET | ORAL | 0 refills | Status: DC

## 2016-02-18 NOTE — Patient Instructions (Addendum)
Please take the prednisone to help decrease inflammation and therefore decrease symptoms. Take it it with food to avoid GI upset. It can cause increased energy but on the other hand it can make it hard to sleep at night so please take it AT [place], it takes 8-12 hours to start working so it will NOT affect your sleeping if you take it at night with your food!!  If you are diabetic it will increase your sugars so decrease carbs and monitor your sugars closely.     Ulnar Nerve Contusion With Rehab The ulnar nerve lies near the surface of the skin as it passes by the elbow. This location causes it to be susceptible to injury. An ulnar nerve contusion is a bruise of the nerve. It is the result of direct trauma to the elbow. Ulnar nerve contusions are characterized by pain, weakness, and loss of feeling in the hand. SYMPTOMS   Signs of nerve damage include: tingling, numbness, weakness, and/or loss of feeling in the hand, specifically the little finger and ring finger.  Sharp pains that may shoot from the elbow to the wrist and hand.  Decreased hand function.  Tenderness and/ or inflammation in the elbow.  Muscle wasting (atrophy) in the hand. CAUSES  Ulnar nerve contusions are caused by direct trauma to the elbow that results in bleeding which enters the nerve. RISK INCREASES WITH:  Contact sports (football, soccer, or rugby).  Bleeding disorders.  Taking blood thinning medicine (warfarin [Coumadin], aspirin, or nonsteroidal anti-inflammatory medications).  Diabetes mellitus.  Underactive thyroid gland (hypothyroidism). PREVENTION  Wear properly fitted and padded protective equipment.  Only take blood thinning medication when necessary. PROGNOSIS  Ulnar nerve contusions usually heal within 6 weeks. Healing often occurs spontaneously, but treatment helps reduce symptoms.  RELATED COMPLICATIONS   Permanent nerve damage, including pain, numbness, tingling, or weakness in the  hand (rare).  Weak grip.  Prolonged healing time, if improperly treated or re-injured. TREATMENT  Treatment initially involves resting from any activities that aggravate the symptoms, and the use of ice and medications to help reduce pain and inflammation. The use of strengthening and stretching exercises may help reduce pain with activity. These exercises may be performed at home or with referral to a therapist. Your caregiver may recommend that you splint the elbow at night to help healing of the nerve. If symptoms persist despite conservative (non-surgical) treatment, then surgery may be recommended to free the nerve. MEDICATION   If pain medication is necessary, then nonsteroidal anti-inflammatory medications, such as aspirin and ibuprofen, or other minor pain relievers, such as acetaminophen, are often recommended.  Do not take pain medication within 7 days before surgery.  Prescription pain relievers may be given if deemed necessary by your caregiver. Use only as directed and only as much as you need. COLD THERAPY  Cold treatment (icing) relieves pain and reduces inflammation. Cold treatment should be applied for 10 to 15 minutes every 2 to 3 hours for inflammation and pain and immediately after any activity that aggravates your symptoms. Use ice packs or massage the area with a piece of ice (ice massage). SEEK MEDICAL CARE IF:   Treatment seems to offer no benefit, or the condition worsens.  Any medications produce adverse side effects. EXERCISES RANGE OF MOTION (ROM) AND STRETCHING EXERCISES - Ulnar Nerve Contusion These exercises may help you when beginning to rehabilitate your injury. Do not begin these exercises until your physician, physical therapist or athletic trainer advises you to  do so. Discontinue any exercise that worsens your symptoms. Contact your physician with instructions on how to continue. Your symptoms may resolve with or without further involvement from your  physician, physical therapist or athletic trainer. While completing these exercises, remember:  Restoring tissue flexibility helps normal motion to return to the joints. This allows healthier, less painful movement and activity.  An effective stretch should be held for at least 30 seconds.  A stretch should never be painful. You should only feel a gentle lengthening or release in the stretched tissue. RANGE OF MOTION - Extension  Hold your right / left arm at your side and straighten your elbow as far as you can using your right / left arm muscles.  Straighten the right / left elbow farther by gently pushing down on your forearm until you feel a gentle stretch on the inside of your elbow. Hold this position for __________ seconds.  Slowly return to the starting position. Repeat __________ times. Complete this exercise __________ times per day.  RANGE OF MOTION - Flexion  Hold your right / left arm at your side and bend your elbow as far as you can using your right / left arm muscles.  Bend the right / left elbow farther by gently pushing up on your forearm until you feel a gentle stretch on the outside of your elbow. Hold this position for __________ seconds.  Slowly return to the starting position. Repeat __________ times. Complete this exercise __________ times per day.  RANGE OF MOTION - Wrist Flexion, Active-Assisted  Extend your right / left elbow with your fingers pointing down.*  Gently pull the back of your hand towards you until you feel a gentle stretch on the top of your forearm.  Hold this position for __________ seconds. Repeat __________ times. Complete this exercise __________ times per day.  *If directed by your physician, physical therapist or athletic trainer, complete this stretch with your elbow bent rather than extended. RANGE OF MOTION - Wrist Extension, Active-Assisted  Extend your right / left elbow and turn your palm upwards.*  Gently pull your  palm/fingertips back so your wrist extends and your fingers point more toward the ground.  You should feel a gentle stretch on the inside of your forearm.  Hold this position for __________ seconds. Repeat __________ times. Complete this exercise __________ times per day. *If directed by your physician, physical therapist or athletic trainer, complete this stretch with your elbow bent, rather than extended. RANGE OF MOTION - Supination, Active  Stand or sit with your elbows at your side. Bend your right / left elbow to 90 degrees.  Turn your palm upward until you feel a gentle stretch on the inside of your forearm.  Hold this position for __________ seconds. Slowly release and return to the starting position. Repeat __________ times. Complete this stretch __________ times per day.  RANGE OF MOTION - Pronation, Active  Stand or sit with your elbows at your side. Bend your right / left elbow to 90 degrees.  Turn your palm downward until you feel a gentle stretch on the top of your forearm.  Hold this position for __________ seconds. Slowly release and return to the starting position. Repeat __________ times. Complete this stretch __________ times per day.  STRETCH - Wrist Flexion   Place the back of your right / left hand on a tabletop leaving your elbow slightly bent. Your fingers should point away from your body.  Gently press the back of your hand down  onto the table by straightening your elbow. You should feel a stretch on the top of your forearm.  Hold this position for __________ seconds. Repeat __________ times. Complete this stretch __________ times per day.  STRETCH - Wrist Extension   Place your right / left fingertips on a tabletop leaving your elbow slightly bent. Your fingers should point backwards.  Gently press your fingers and palm down onto the table by straightening your elbow. You should feel a stretch on the inside of your forearm.  Hold this position for  __________ seconds. Repeat __________ times. Complete this stretch __________ times per day.  STRENGTHENING EXERCISES - Ulnar Nerve Contusion These exercises may help you when beginning to rehabilitate your injury. Do not begin these exercises until your physician, physical therapist or athletic trainer advises you to do so. Discontinue any exercise that worsens your symptoms. Contact your physician for instructions on how to continue. They may resolve your symptoms with or without further involvement from your physician, physical therapist or athletic trainer. While completing these exercises, remember:   Muscles can gain both the endurance and the strength needed for everyday activities through controlled exercises.  Complete these exercises as instructed by your physician, physical therapist or athletic trainer. Progress with the resistance and repetition exercises only as your caregiver advises. STRENGTH - Wrist Flexors  Sit with your right / left forearm palm-up and fully supported. Your elbow should be resting below the height of your shoulder. Allow your wrist to extend over the edge of the surface.  Loosely holding a __________ weight or a piece of rubber exercise band/tubing, slowly curl your hand up toward your forearm.  Hold this position for __________ seconds. Slowly lower the wrist back to the starting position in a controlled manner. Repeat __________ times. Complete this exercise __________ times per day.  STRENGTH - Wrist Extensors  Sit with your right / left forearm palm-down and fully supported. Your elbow should be resting below the height of your shoulder. Allow your wrist to extend over the edge of the surface.  Loosely holding a __________ weight or a piece of rubber exercise band/tubing, slowly curl your hand up toward your forearm.  Hold this position for __________ seconds. Slowly lower the wrist back to the starting position in a controlled manner. Repeat __________  times. Complete this exercise __________ times per day.  STRENGTH - Ulnar Deviators  Stand with a ____________________ weight in your right / left hand or sit holding on to the rubber exercise band/tubing with your opposite arm supported.  Move your wrist so that your pinkie travels toward your forearm and your thumb moves away from your forearm.  Hold this position for __________ seconds and then slowly lower the wrist back to the starting position. Repeat __________ times. Complete this exercise __________ times per day STRENGTH - Radial Deviators  Stand with a ____________________ weight in your right / left hand or sit holding on to the rubber exercise band/tubing with your arm supported.  Raise your hand upward in front of you or pull up on the rubber tubing.  Hold this position for __________ seconds and then slowly lower the wrist back to the starting position. Repeat __________ times. Complete this exercise __________ times per day. STRENGTH - Grip  Grasp a tennis ball, a dense sponge, or a large, rolled sock in your hand.  Squeeze as hard as you can without increasing any pain.  Hold this position for __________ seconds. Release your grip slowly. Repeat __________ times.  Complete this exercise __________ times per day.    This information is not intended to replace advice given to you by your health care provider. Make sure you discuss any questions you have with your health care provider.   Document Released: 04/06/2005 Document Revised: 08/21/2014 Document Reviewed: 07/19/2008 Elsevier Interactive Patient Education [employer].

## 2016-02-18 NOTE — Progress Notes (Signed)
   Subjective:    Patient ID: Christie Le, female    DOB: 07-15-1980, 35 y.o.   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 States has been worse last 2-4 weeks. Numbness 4th and 5th fingers and up to her elbow, with pain at 1st MCP. No injury. Waking up in the AM is the worse, better with movement, nothing worse. Ibuprofen rarely. Some decreased grip with carrying in groceries but no weakness. Denies neck, shoulder, elbow pain.   Blood pressure 122/80, pulse 82, temperature 98.9 F (37.2 C), resp. rate 16, height 5\' 5"  (1.651 m), weight 151 lb 12.8 oz (68.9 kg), SpO2 98 %.  Medications Current Outpatient Prescriptions on File Prior to Visit  Medication Sig  . buPROPion (WELLBUTRIN XL) 150 MG 24 hr tablet Take 2 tablets (300 mg total) by mouth every morning.  . fluticasone (FLONASE) 50 MCG/ACT nasal spray USE 2 SPRAYS IN EACH NOSTRIL EVERY DAY  . fluticasone furoate-vilanterol (BREO ELLIPTA) 100-25 MCG/INH AEPB Inhale 1 puff into the lungs daily. Rinse mouth with water after each use  . levonorgestrel (MIRENA) 20 MCG/24HR IUD 1 each by Intrauterine route once.  . montelukast (SINGULAIR) 10 MG tablet TAKE 1 TABLET BY MOUTH EVERY DAY  . PROAIR HFA 108 (90 Base) MCG/ACT inhaler Inhale 1-2 puffs into the lungs every 4 hours as needed for wheezing or shortness of breath.  . valACYclovir (VALTREX) 1000 MG tablet take 2 tablets by mouth twice a day   No current facility-administered medications on file prior to visit.     Problem list She has Allergy; Anemia; Anxiety; Vitamin D deficiency; and S/P laparoscopic cholecystectomy June 15 on her problem list.   Review of Systems  Constitutional: Negative.   HENT: Negative.   Respiratory: Negative.   Cardiovascular: Negative.   Gastrointestinal: Negative.   Genitourinary: Negative.   Musculoskeletal: Positive for arthralgias and myalgias. Negative for joint swelling and neck pain.   Skin: Negative.   Neurological: Positive for numbness.  Hematological: Negative.   Psychiatric/Behavioral: Negative.        Objective:   Physical Exam  Constitutional: She is oriented to person, place, and time. She appears well-developed and well-nourished.  Cardiovascular: Normal rate and regular rhythm.   Pulmonary/Chest: Effort normal and breath sounds normal.  Musculoskeletal: Normal range of motion.  No pain at neck/left shoulder, full ROM. + tenderness at left medial epicondyle/ulnar nerve, + abnormal sensation left hand ulnar distribution, normal reflex, normal cap refill, negative phalen's/tinel's. No swelling, erythema, warmth of any joints. Full ROM left wrist.   Neurological: She is alert and oriented to person, place, and time. She has normal reflexes.  Skin: No rash noted.       Assessment & Plan:  Ulnar nerve entrapment- Keep elbow extended, prevent flexion, prednisone If not better will refer to [facility]

## 2016-03-19 ENCOUNTER — Encounter: Payer: Self-pay | Admitting: Physician Assistant

## 2016-03-19 DIAGNOSIS — M25529 Pain in unspecified elbow: Secondary | ICD-10-CM

## 2016-03-20 MED ORDER — PROMETHAZINE-DM 6.25-15 MG/5ML PO SYRP: 5.0000 mL | ORAL_SOLUTION | Freq: Four times a day (QID) | ORAL | 1 refills | Status: DC | PRN

## 2016-03-24 ENCOUNTER — Other Ambulatory Visit: Payer: Self-pay | Admitting: Physician Assistant

## 2016-03-24 MED ORDER — BENZONATATE 100 MG PO CAPS: 200.0000 mg | ORAL_CAPSULE | Freq: Three times a day (TID) | ORAL | 0 refills | Status: DC | PRN

## 2016-03-26 ENCOUNTER — Ambulatory Visit (INDEPENDENT_AMBULATORY_CARE_PROVIDER_SITE_OTHER): Payer: BLUE CROSS/BLUE SHIELD | Admitting: Family

## 2016-03-26 ENCOUNTER — Encounter (INDEPENDENT_AMBULATORY_CARE_PROVIDER_SITE_OTHER): Payer: Self-pay | Admitting: Orthopedic Surgery

## 2016-03-26 VITALS — Ht 66.0 in | Wt 150.0 lb

## 2016-03-26 DIAGNOSIS — G5622 Lesion of ulnar nerve, left upper limb: Secondary | ICD-10-CM

## 2016-03-26 NOTE — Progress Notes (Signed)
Office Visit Note   Patient: Christie Le           Date of Birth: Jul 09, 1980           MRN: [id number] Visit Date: 03/26/2016              Requested by: Unk Pinto, MD [address] [place], [place] [address] PCP: Alesia Richards, MD   Assessment & Plan: Visit Diagnoses:  1. Ulnar nerve entrapment, left     Plan: Have provided an order for a elbow immobilizer she'll obtain this from [facility]. Follow-up in 4 more weeks.  Follow-Up Instructions: Return in about 4 weeks (around 04/23/2016).   Orders:  No orders of the defined types were placed in this encounter.  No orders of the defined types were placed in this encounter.     Procedures: No procedures performed   Clinical Data: No additional findings.   Subjective: Chief Complaint  Patient presents with  . Left Elbow - Pain, Numbness    Patient is a 35 year old woman seen for evaluation of left elbow pain with numbness in the left hand. This has been ongoing for months. Is seen in referral from Dr. Melford Aase.   She is right hand dominant. She complains of numbness at pinky and ring finger of left hand. She has tried prednisone taper and bracing which did help provide some relief for her numbness. She complains of frequent pain radiating down to her fingers.    Review of Systems  Constitutional: Negative for chills and fever.     Objective: Vital Signs: Ht 5\' 6"  (1.676 m)   Wt 150 lb (68 kg)   BMI 24.21 kg/m   Physical Exam  Constitutional: She is oriented to person, place, and time. She appears well-developed and well-nourished.  Pulmonary/Chest: Effort normal.  Musculoskeletal:       Left elbow: She exhibits normal range of motion and no swelling. Tenderness found. Medial epicondyle tenderness noted. No lateral epicondyle and no olecranon process tenderness noted.       Left hand: Decreased sensation noted. Decreased sensation is present in the ulnar distribution.  Neurological:  She is alert and oriented to person, place, and time.  Psychiatric: She has a normal mood and affect.  Nursing note reviewed.   Ortho Exam  Specialty Comments:  No specialty comments available.  Imaging: No results found.   PMFS History: Patient Active Problem List   Diagnosis Date Noted  . 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 S/P laparoscopic cholecystectomy June 15 09/25/2013  . Allergy   . Anemia   . Anxiety   . Vitamin D deficiency    Past Medical History:  Diagnosis Date  . Anemia    hx of  . Asthma    no problems since pregnancy  . Family history of anesthesia complication    "mom is sensitive- severe n/v"  . Fibrocystic breast   . Pneumonia    age 52  . PONV (postoperative nausea and vomiting)   . Seasonal allergies   . Vitamin D deficiency     Family History  Problem Relation Age of Onset  . Hypertension Mother   . Diabetes Father   . Cancer Father     prostate  . Cancer Other     breast  . Hypertension Other   . Heart disease Other     Past Surgical History:  Procedure Laterality Date  . CESAREAN SECTION  07/29/2009  . CHOLECYSTECTOMY N/A 09/25/2013  Procedure: LAPAROSCOPIC CHOLECYSTECTOMY WITH INTRAOPERATIVE CHOLANGIOGRAM;  Surgeon: Pedro Earls, MD;  Location: [facility];  Service: General;  Laterality: N/A;  . WISDOM TOOTH EXTRACTION  2002   Social History   Occupational History  . Not on file.   Social History Main Topics  . Smoking status: Never Smoker  . Smokeless tobacco: Never Used  . Alcohol use Yes     Comment: rare  . Drug use: No  . Sexual activity: Yes    Birth control/ protection: IUD

## 2016-04-03 ENCOUNTER — Ambulatory Visit (INDEPENDENT_AMBULATORY_CARE_PROVIDER_SITE_OTHER): Payer: BLUE CROSS/BLUE SHIELD | Admitting: Internal Medicine

## 2016-04-03 ENCOUNTER — Encounter: Payer: Self-pay | Admitting: Internal Medicine

## 2016-04-03 VITALS — BP 108/62 | HR 72 | Temp 98.2°F | Resp 18 | Ht 66.0 in

## 2016-04-03 DIAGNOSIS — J069 Acute upper respiratory infection, unspecified: Secondary | ICD-10-CM | POA: Diagnosis not present

## 2016-04-03 MED ORDER — DOXYCYCLINE HYCLATE 100 MG PO CAPS: 100.0000 mg | ORAL_CAPSULE | Freq: Two times a day (BID) | ORAL | 0 refills | Status: DC

## 2016-04-03 MED ORDER — AZELASTINE HCL 0.1 % NA SOLN: 2.0000 | Freq: Two times a day (BID) | NASAL | 2 refills | Status: DC

## 2016-04-03 MED ORDER — PROMETHAZINE-CODEINE 6.25-10 MG/5ML PO SYRP: 5.0000 mL | ORAL_SOLUTION | Freq: Three times a day (TID) | ORAL | 0 refills | Status: DC | PRN

## 2016-04-03 MED ORDER — PREDNISONE 20 MG PO TABS: ORAL_TABLET | ORAL | 0 refills | Status: DC

## 2016-04-03 NOTE — Progress Notes (Signed)
HPI  Patient presents to the office for evaluation of cough.  It has been going on for 1 months.  Patient reports night > day, wet, dry, worse with lying down.  They also endorse change in voice, postnasal drip, shortness of breath, wheezing and nasal congestion, ear pain, sore throat, headaches.  .  They have tried antitussives, antihistamines or singulair, claritin D, flonase.  They report that nothing has worked.  They admits to other sick contacts.  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    Review of Systems  Constitutional: Positive for malaise/fatigue. Negative for chills and fever.  HENT: Positive for congestion, ear pain, hearing loss and sore throat.   Respiratory: Positive for cough. Negative for sputum production, shortness of breath and wheezing.   Cardiovascular: Negative for chest pain, palpitations and leg swelling.  Neurological: Positive for headaches.    PE:  Vitals:   04/03/16 1118  BP: 108/62  Pulse: 72  Resp: 18  Temp: 98.2 F (36.8 C)    General:  Alert and non-toxic, WDWN, NAD HEENT: NCAT, PERLA, EOM normal, no occular discharge or erythema.  Nasal mucosal edema with sinus tenderness to palpation.  Oropharynx clear with minimal oropharyngeal edema and erythema.  Mucous membranes moist and pink. Neck:  Cervical adenopathy Chest:  RRR no MRGs.  Lungs clear to auscultation A&P with no wheezes rhonchi or rales.   Abdomen: +BS x 4 quadrants, soft, non-tender, no guarding, rigidity, or rebound. Skin: warm and dry no rash Neuro: A&Ox4, CN II-XII grossly intact  Assessment and Plan:   1. Acute URI -phenergan dm -tessalon perles  -prednisone -flonase -astelin -benadryl at bedtime -cont antihistamine -albuterol q6hrs -doxycycline -cont singulair

## 2016-04-16 ENCOUNTER — Encounter: Payer: Self-pay | Admitting: Physician Assistant

## 2016-04-16 MED ORDER — ONDANSETRON HCL 4 MG PO TABS: 4.0000 mg | ORAL_TABLET | Freq: Every day | ORAL | 1 refills | Status: AC | PRN

## 2016-04-17 ENCOUNTER — Ambulatory Visit (INDEPENDENT_AMBULATORY_CARE_PROVIDER_SITE_OTHER): Payer: BLUE CROSS/BLUE SHIELD | Admitting: Orthopedic Surgery

## 2016-05-21 ENCOUNTER — Encounter: Payer: Self-pay | Admitting: Physician Assistant

## 2016-06-30 ENCOUNTER — Other Ambulatory Visit: Payer: Self-pay | Admitting: Physician Assistant

## 2016-07-02 ENCOUNTER — Ambulatory Visit (INDEPENDENT_AMBULATORY_CARE_PROVIDER_SITE_OTHER): Payer: 59 | Admitting: Physician Assistant

## 2016-07-02 ENCOUNTER — Encounter: Payer: Self-pay | Admitting: Physician Assistant

## 2016-07-02 VITALS — BP 122/86 | HR 75 | Temp 98.1°F | Resp 14 | Ht 66.0 in | Wt 151.0 lb

## 2016-07-02 DIAGNOSIS — Z13220 Encounter for screening for lipoid disorders: Secondary | ICD-10-CM

## 2016-07-02 DIAGNOSIS — F419 Anxiety disorder, unspecified: Secondary | ICD-10-CM

## 2016-07-02 DIAGNOSIS — Z0000 Encounter for general adult medical examination without abnormal findings: Secondary | ICD-10-CM | POA: Diagnosis not present

## 2016-07-02 DIAGNOSIS — T7840XD Allergy, unspecified, subsequent encounter: Secondary | ICD-10-CM

## 2016-07-02 DIAGNOSIS — E559 Vitamin D deficiency, unspecified: Secondary | ICD-10-CM

## 2016-07-02 DIAGNOSIS — Z79899 Other long term (current) drug therapy: Secondary | ICD-10-CM

## 2016-07-02 DIAGNOSIS — Z1389 Encounter for screening for other disorder: Secondary | ICD-10-CM

## 2016-07-02 DIAGNOSIS — D649 Anemia, unspecified: Secondary | ICD-10-CM

## 2016-07-02 LAB — CBC WITH DIFFERENTIAL/PLATELET
BASOS ABS: 0 {cells}/uL (ref 0–200)
Basophils Relative: 0 %
EOS PCT: 1 %
Eosinophils Absolute: 76 cells/uL (ref 15–500)
HCT: 40 % (ref 35.0–45.0)
HEMOGLOBIN: 13.1 g/dL (ref 11.7–15.5)
LYMPHS ABS: 1748 {cells}/uL (ref 850–3900)
Lymphocytes Relative: 23 %
MCH: 29.6 pg (ref 27.0–33.0)
MCHC: 32.8 g/dL (ref 32.0–36.0)
MCV: 90.3 fL (ref 80.0–100.0)
MONO ABS: 380 {cells}/uL (ref 200–950)
MPV: 11.3 fL (ref 7.5–12.5)
Monocytes Relative: 5 %
NEUTROS ABS: 5396 {cells}/uL (ref 1500–7800)
NEUTROS PCT: 71 %
Platelets: 295 10*3/uL (ref 140–400)
RBC: 4.43 MIL/uL (ref 3.80–5.10)
RDW: 13 % (ref 11.0–15.0)
WBC: 7.6 10*3/uL (ref 3.8–10.8)

## 2016-07-02 LAB — LIPID PANEL
Cholesterol: 142 mg/dL (ref <200)
HDL: 38 mg/dL — ABNORMAL LOW (ref >50)
LDL CALC: 94 mg/dL (ref <100)
Total CHOL/HDL Ratio: 3.7 Ratio (ref <5.0)
Triglycerides: 51 mg/dL (ref <150)
VLDL: 10 mg/dL (ref <30)

## 2016-07-02 LAB — HEPATIC FUNCTION PANEL
ALT: 114 U/L — AB (ref 6–29)
AST: 267 U/L — AB (ref 10–30)
Albumin: 4.3 g/dL (ref 3.6–5.1)
Alkaline Phosphatase: 51 U/L (ref 33–115)
BILIRUBIN DIRECT: 0.1 mg/dL (ref <=0.2)
BILIRUBIN INDIRECT: 0.3 mg/dL (ref 0.2–1.2)
Total Bilirubin: 0.4 mg/dL (ref 0.2–1.2)
Total Protein: 7.1 g/dL (ref 6.1–8.1)

## 2016-07-02 LAB — BASIC METABOLIC PANEL WITH GFR
BUN: 14 mg/dL (ref 7–25)
CO2: 23 mmol/L (ref 20–31)
CREATININE: 0.74 mg/dL (ref 0.50–1.10)
Calcium: 9.6 mg/dL (ref 8.6–10.2)
Chloride: 107 mmol/L (ref 98–110)
GFR, Est African American: >89 mL/min (ref >=60)
GLUCOSE: 84 mg/dL (ref 65–99)
Potassium: 4.3 mmol/L (ref 3.5–5.3)
SODIUM: 141 mmol/L (ref 135–146)

## 2016-07-02 LAB — TSH: TSH: 1.44 mIU/L

## 2016-07-02 MED ORDER — ALPRAZOLAM 0.5 MG PO TABS: 0.5000 mg | ORAL_TABLET | Freq: Three times a day (TID) | ORAL | 0 refills | Status: DC | PRN

## 2016-07-02 NOTE — Progress Notes (Signed)
Complete Physical  Assessment and Plan: Vitamin D deficiency - Vit D  25 hydroxy (rtn osteoporosis monitoring)   Anxiety Anxiety- continue medications, stress management techniques discussed, increase water, good sleep hygiene discussed, increase exercise, and increase veggies.   Anemia, unspecified anemia type Anemia- monitor, continue iron supp with Vitamin C and increase green leafy veggies   Allergy, subsequent encounter Continue OTC allergy pills   Medication management - Lipid panel - Magnesium   Asthma Continue singulair   Discussed med's effects and SE's. Screening labs and tests as requested with regular follow-up as recommended.  HPI  36 y.o. female  presents for a complete physical.  Her blood pressure has been controlled at home, today their BP is BP: 122/86 She does workout, 3-4 times a week. She denies chest pain, shortness of breath, dizziness.  She is not on cholesterol medication and denies myalgias. Her cholesterol is at goal. The cholesterol last visit was:   Lab Results  Component Value Date   CHOL 141 06/28/2015   HDL 40 (L) 06/28/2015   LDLCALC 88 06/28/2015   TRIG 67 06/28/2015   CHOLHDL 3.5 06/28/2015   Last A1C in the office was:  Lab Results  Component Value Date   HGBA1C 5.4 06/28/2015  Patient is on Vitamin D supplement. 5000 IU   Lab Results  Component Value Date   VD25OH 26 (L) 06/28/2015   Asthma well controlled, uses albuterol inhaler rarely Has not been taking wellbutrin consistently.  Has 35 year old twin girls, at [employer], going to get married to ex husband josh in 9 days, going to [place] for honeymoon.  BMI is Body mass index is 24.37 kg/m., she is working on diet and exercise. Wt Readings from Last 3 Encounters:  07/02/16 151 lb (68.5 kg)  03/26/16 150 lb (68 kg)  02/18/16 151 lb 12.8 oz (68.9 kg)    Current Medications:  Current Outpatient Prescriptions on File Prior to Visit  Medication Sig Dispense  Refill  . azelastine (ASTELIN) 0.1 % nasal spray Place 2 sprays into both nostrils 2 (two) times daily. Use in each nostril as directed 30 mL 2  . buPROPion (WELLBUTRIN XL) 150 MG 24 hr tablet Take 2 tablets (300 mg total) by mouth every morning. 60 tablet 2  . fluticasone (FLONASE) 50 MCG/ACT nasal spray USE 2 SPRAYS IN EACH NOSTRIL EVERY DAY 16 g 1  . fluticasone furoate-vilanterol (BREO ELLIPTA) 100-25 MCG/INH AEPB Inhale 1 puff into the lungs daily. Rinse mouth with water after each use 1 each 4  . levonorgestrel (MIRENA) 20 MCG/24HR IUD 1 each by Intrauterine route once.    . montelukast (SINGULAIR) 10 MG tablet TAKE 1 TABLET BY MOUTH EVERY DAY 90 tablet 99  . ondansetron (ZOFRAN) 4 MG tablet Take 1 tablet (4 mg total) by mouth daily as needed for nausea or vomiting. 30 tablet 1  . PROAIR HFA 108 (90 Base) MCG/ACT inhaler Inhale 1-2 puffs into the lungs every 4 hours as needed for wheezing or shortness of breath. 17 g 1  . valACYclovir (VALTREX) 1000 MG tablet take 2 tablets by mouth twice a day 360 tablet 0   No current facility-administered medications on file prior to visit.    Health Maintenance:   Immunization History  Administered Date(s) Administered  . Influenza-Unspecified 11/21/2012  . Tdap 05/10/2012   Tetanus: 04/2012 Pneumovax: N/A Flu vaccine: 2017 Zostavax: N/A  Pap: 05/2014- mirena placed  MGM: N/A DEXA: N/A Colonoscopy: N/A EGD: N.A  Last  Dental Exam: Dr. Aida Puffer Last Eye Exam: None, has been to [facility] in the past  Patient Care Team: Unk Pinto, MD as PCP - General (Internal Medicine)  Medical History:  Past Medical History:  Diagnosis Date  . Anemia    hx of  . Asthma    no problems since pregnancy  . Family history of anesthesia complication    "mom is sensitive- severe n/v"  . Fibrocystic breast   . Pneumonia    age 28  . PONV (postoperative nausea and vomiting)   . Seasonal allergies   . Vitamin D deficiency    Allergies No  Known Allergies  SURGICAL HISTORY She  has a past surgical history that includes Cesarean section (07/29/2009); Wisdom tooth extraction (2002); and Cholecystectomy (N/A, 09/25/2013). FAMILY HISTORY Her family history includes Cancer in her father and other; Diabetes in her father; Heart disease in her other; Hypertension in her mother and other. SOCIAL HISTORY She  reports that she has never smoked. She has never used smokeless tobacco. She reports that she drinks alcohol. She reports that she does not use drugs.  Review of Systems: Review of Systems  Constitutional: Positive for malaise/fatigue. Negative for chills, diaphoresis, fever and weight loss.  HENT: Negative for congestion, ear discharge, ear pain, hearing loss, nosebleeds, sore throat and tinnitus.   Eyes: Negative.   Respiratory: Negative.  Negative for stridor.   Cardiovascular: Negative.   Gastrointestinal: Negative for abdominal pain, blood in stool, constipation, diarrhea, heartburn, melena, nausea and vomiting.  Genitourinary: Negative.   Musculoskeletal: Negative.   Skin: Negative.   Neurological: Negative.  Negative for weakness and headaches.  Endo/Heme/Allergies: Negative.   Psychiatric/Behavioral: Negative.     Physical Exam: Estimated body mass index is 24.37 kg/m as calculated from the following:   Height as of this encounter: 5\' 6"  (1.676 m).   Weight as of this encounter: 151 lb (68.5 kg). BP 122/86   Pulse 75   Temp 98.1 F (36.7 C)   Resp 14   Ht 5\' 6"  (1.676 m)   Wt 151 lb (68.5 kg)   SpO2 97%   BMI 24.37 kg/m  General Appearance: Well nourished, in no apparent distress.  Eyes: PERRLA, EOMs, conjunctiva no swelling or erythema, normal fundi and vessels.  Sinuses: No Frontal/maxillary tenderness  ENT/Mouth: Ext aud canals clear, normal light reflex with TMs without erythema, bulging. Good dentition. No erythema, swelling, or exudate on post pharynx. Tonsils + swelling with tonsil stone on left side.  Hearing normal.  Neck: Supple, thyroid normal. No bruits  Respiratory: Respiratory effort normal, BS equal bilaterally without rales, rhonchi, wheezing or stridor.  Cardio: RRR without murmurs, rubs or gallops. Brisk peripheral pulses without edema.  Chest: symmetric, with normal excursions and percussion.  Breasts: defer OB/GYN Abdomen: Soft, nontender, no guarding, rebound, hernias, masses, or organomegaly. .  Lymphatics: Non tender without lymphadenopathy.  Genitourinary: defer OB/GYN going in Aug to get mirena replaced Musculoskeletal: Full ROM all peripheral extremities,5/5 strength, and normal gait.  Skin: Warm, dry without rashes, lesions, ecchymosis. Neuro: Cranial nerves intact, reflexes equal bilaterally. Normal muscle tone, no cerebellar symptoms. Sensation intact.  Psych: Awake and oriented X 3, normal affect, Insight and Judgment appropriate.   EKG: defer  Vicie Mutters 9:21 AM [facility]

## 2016-07-02 NOTE — Patient Instructions (Signed)
  Vitamin D goal is between 60-80  Please make sure that you are taking your Vitamin D as directed.   It is very important as a natural anti-inflammatory   helping hair, skin, and nails, as well as reducing stroke and heart attack risk.   It helps your bones and helps with mood.  We want you on at least 5000 IU daily  It also decreases numerous cancer risks so please take it as directed.   Low Vit D is associated with a 200-300% higher risk for CANCER   and 200-300% higher risk for HEART   ATTACK  &  STROKE.    ......................................  It is also associated with higher death rate at younger ages,   autoimmune diseases like Rheumatoid arthritis, Lupus, Multiple Sclerosis.     Also many other serious conditions, like depression, Alzheimer's  Dementia, infertility, muscle aches, fatigue, fibromyalgia - just to name a few.  +++++++++++++++++++  Can get liquid vitamin D from [employer]  OR here in [place] at  [facility] [address], [place], [place] [address] Or you can try [employer]    

## 2016-07-03 LAB — URINALYSIS, ROUTINE W REFLEX MICROSCOPIC
Bilirubin Urine: NEGATIVE
GLUCOSE, UA: NEGATIVE
HGB URINE DIPSTICK: NEGATIVE
KETONES UR: NEGATIVE
Leukocytes, UA: NEGATIVE
NITRITE: NEGATIVE
PROTEIN: NEGATIVE
Specific Gravity, Urine: 1.025 (ref 1.001–1.035)
pH: 6 (ref 5.0–8.0)

## 2016-07-03 LAB — MICROALBUMIN / CREATININE URINE RATIO
Creatinine, Urine: 143 mg/dL (ref 20–320)
MICROALB UR: 0.4 mg/dL
MICROALB/CREAT RATIO: 3 ug/mg{creat} (ref <30)

## 2016-07-03 LAB — VITAMIN D 25 HYDROXY (VIT D DEFICIENCY, FRACTURES): VIT D 25 HYDROXY: 29 ng/mL — AB (ref 30–100)

## 2016-07-03 LAB — MAGNESIUM: Magnesium: 2 mg/dL (ref 1.5–2.5)

## 2016-07-06 ENCOUNTER — Encounter: Payer: Self-pay | Admitting: Physician Assistant

## 2016-07-06 DIAGNOSIS — R945 Abnormal results of liver function studies: Principal | ICD-10-CM

## 2016-07-06 DIAGNOSIS — R7989 Other specified abnormal findings of blood chemistry: Secondary | ICD-10-CM

## 2016-07-08 ENCOUNTER — Other Ambulatory Visit: Payer: 59

## 2016-07-08 DIAGNOSIS — R945 Abnormal results of liver function studies: Principal | ICD-10-CM

## 2016-07-08 DIAGNOSIS — R7989 Other specified abnormal findings of blood chemistry: Secondary | ICD-10-CM

## 2016-07-09 LAB — HEPATIC FUNCTION PANEL
ALT: 33 U/L — AB (ref 6–29)
AST: 23 U/L (ref 10–30)
Albumin: 4.5 g/dL (ref 3.6–5.1)
Alkaline Phosphatase: 46 U/L (ref 33–115)
BILIRUBIN DIRECT: 0.1 mg/dL (ref <=0.2)
Indirect Bilirubin: 0.4 mg/dL (ref 0.2–1.2)
TOTAL PROTEIN: 7.2 g/dL (ref 6.1–8.1)
Total Bilirubin: 0.5 mg/dL (ref 0.2–1.2)

## 2016-07-09 LAB — ANTI-DNA ANTIBODY, DOUBLE-STRANDED: ds DNA Ab: 2 IU/mL

## 2016-07-09 LAB — HEPATITIS C ANTIBODY: HCV Ab: NEGATIVE

## 2016-07-09 LAB — ANA: ANA: NEGATIVE

## 2016-07-28 ENCOUNTER — Other Ambulatory Visit: Payer: Self-pay | Admitting: Physician Assistant

## 2016-08-03 ENCOUNTER — Encounter: Payer: Self-pay | Admitting: Physician Assistant

## 2016-08-12 ENCOUNTER — Other Ambulatory Visit: Payer: Self-pay | Admitting: Physician Assistant

## 2016-10-16 ENCOUNTER — Encounter: Payer: Self-pay | Admitting: Physician Assistant

## 2016-10-20 ENCOUNTER — Other Ambulatory Visit: Payer: Self-pay | Admitting: *Deleted

## 2016-10-20 ENCOUNTER — Other Ambulatory Visit: Payer: Self-pay | Admitting: Physician Assistant

## 2016-10-20 ENCOUNTER — Encounter: Payer: Self-pay | Admitting: Physician Assistant

## 2016-10-20 MED ORDER — ALPRAZOLAM 0.5 MG PO TABS: 0.5000 mg | ORAL_TABLET | Freq: Three times a day (TID) | ORAL | 0 refills | Status: AC | PRN

## 2016-10-20 MED ORDER — ALPRAZOLAM 0.5 MG PO TABS: 0.5000 mg | ORAL_TABLET | Freq: Three times a day (TID) | ORAL | 0 refills | Status: DC | PRN

## 2016-10-27 ENCOUNTER — Encounter: Payer: Self-pay | Admitting: Physician Assistant

## 2016-11-02 ENCOUNTER — Other Ambulatory Visit: Payer: Self-pay | Admitting: Physician Assistant

## 2016-11-05 ENCOUNTER — Encounter: Payer: Self-pay | Admitting: Physician Assistant

## 2016-11-09 ENCOUNTER — Emergency Department (HOSPITAL_BASED_OUTPATIENT_CLINIC_OR_DEPARTMENT_OTHER): Payer: 59

## 2016-11-09 ENCOUNTER — Emergency Department (HOSPITAL_BASED_OUTPATIENT_CLINIC_OR_DEPARTMENT_OTHER)
Admission: EM | Admit: 2016-11-09 | Discharge: 2016-11-09 | Disposition: A | Discharge status: Home / Self Care | Payer: 59 | Attending: Emergency Medicine | Admitting: Emergency Medicine

## 2016-11-09 ENCOUNTER — Telehealth: Payer: Self-pay | Admitting: *Deleted

## 2016-11-09 ENCOUNTER — Encounter (HOSPITAL_BASED_OUTPATIENT_CLINIC_OR_DEPARTMENT_OTHER): Payer: Self-pay | Admitting: *Deleted

## 2016-11-09 DIAGNOSIS — J45909 Unspecified asthma, uncomplicated: Secondary | ICD-10-CM | POA: Diagnosis not present

## 2016-11-09 DIAGNOSIS — J209 Acute bronchitis, unspecified: Secondary | ICD-10-CM | POA: Diagnosis not present

## 2016-11-09 DIAGNOSIS — Z79899 Other long term (current) drug therapy: Secondary | ICD-10-CM | POA: Diagnosis not present

## 2016-11-09 DIAGNOSIS — R0602 Shortness of breath: Secondary | ICD-10-CM | POA: Diagnosis not present

## 2016-11-09 DIAGNOSIS — R002 Palpitations: Secondary | ICD-10-CM | POA: Diagnosis not present

## 2016-11-09 DIAGNOSIS — R079 Chest pain, unspecified: Secondary | ICD-10-CM | POA: Diagnosis not present

## 2016-11-09 LAB — COMPREHENSIVE METABOLIC PANEL
ALT: 15 U/L (ref 14–54)
ANION GAP: 9 (ref 5–15)
AST: 22 U/L (ref 15–41)
Albumin: 4.2 g/dL (ref 3.5–5.0)
Alkaline Phosphatase: 54 U/L (ref 38–126)
BUN: 15 mg/dL (ref 6–20)
CHLORIDE: 104 mmol/L (ref 101–111)
CO2: 25 mmol/L (ref 22–32)
Calcium: 9.2 mg/dL (ref 8.9–10.3)
Creatinine, Ser: 0.94 mg/dL (ref 0.44–1.00)
Glucose, Bld: 95 mg/dL (ref 65–99)
POTASSIUM: 3.7 mmol/L (ref 3.5–5.1)
Sodium: 138 mmol/L (ref 135–145)
Total Bilirubin: 0.6 mg/dL (ref 0.3–1.2)
Total Protein: 7.5 g/dL (ref 6.5–8.1)

## 2016-11-09 LAB — D-DIMER, QUANTITATIVE (NOT AT ARMC)

## 2016-11-09 LAB — CBC
HCT: 36.2 % (ref 36.0–46.0)
Hemoglobin: 12.3 g/dL (ref 12.0–15.0)
MCH: 30.3 pg (ref 26.0–34.0)
MCHC: 34 g/dL (ref 30.0–36.0)
MCV: 89.2 fL (ref 78.0–100.0)
PLATELETS: 265 10*3/uL (ref 150–400)
RBC: 4.06 MIL/uL (ref 3.87–5.11)
RDW: 12.4 % (ref 11.5–15.5)
WBC: 10.5 10*3/uL (ref 4.0–10.5)

## 2016-11-09 LAB — TROPONIN I

## 2016-11-09 LAB — PREGNANCY, URINE: Preg Test, Ur: NEGATIVE

## 2016-11-09 NOTE — ED Notes (Signed)
Patient transported to X-ray 

## 2016-11-09 NOTE — ED Triage Notes (Addendum)
Pt c/o "palpatations" and SOB x 1 week, pt states she has been taking Claritin D  X 1 week

## 2016-11-09 NOTE — ED Provider Notes (Signed)
[facility] DEPT Provider Note   CSN: [id number] Arrival date & time: 11/09/16  1741  By signing my name below, I, Ephriam Jenkins, attest that this documentation has been prepared under the direction and in the presence of Mia McDonald PA-C  Electronically Signed: Ephriam Jenkins, ED Scribe. 11/09/16. 6:42 PM.  History   Chief Complaint Chief Complaint  Patient presents with  . Palpitations    HPI HPI Comments: Christie Le is a 37 y.o. female with Hx of heart murmur, who presents to the Emergency Department complaining of palpitations and intermittent, worsening shortness of breath that started one week ago. Pt reports intermittent episodes of shortness of breath within the past week. During these episodes she frequently has nausea.  She has Hx of asthma and is prescribed Albuterol, which she has used once this week. She last used her albuterol inhaler two days ago. She also describes intermittent, non-radiating chest pain to the center of her chest that is made worse when laying flat. She has noted congestion for the past week and has taken Claritin D once in the past week. No noted lower extremity swelling. Pt has a Mirena IUD. No recent long distance travel. No family Hx of DVT. No other noted medical issues. No vomiting or diarrhea. No rashes, headaches or wheezing.   The history is provided by the patient. No language interpreter was used.    Past Medical History:  Diagnosis Date  . Anemia    hx of  . Asthma    no problems since pregnancy  . Family history of anesthesia complication    "mom is sensitive- severe n/v"  . Fibrocystic breast   . Pneumonia    age 72  . PONV (postoperative nausea and vomiting)   . Seasonal allergies   . Vitamin D deficiency     Patient Active Problem List   Diagnosis Date Noted  . 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 S/P laparoscopic cholecystectomy June 15 09/25/2013  . Allergy   . Anemia   . Anxiety   . Vitamin D deficiency      Past Surgical History:  Procedure Laterality Date  . CESAREAN SECTION  07/29/2009  . CHOLECYSTECTOMY N/A 09/25/2013   Procedure: LAPAROSCOPIC CHOLECYSTECTOMY WITH INTRAOPERATIVE CHOLANGIOGRAM;  Surgeon: Pedro Earls, MD;  Location: [facility] ORS;  Service: General;  Laterality: N/A;  . WISDOM TOOTH EXTRACTION  2002    OB History    No data available       Home Medications    Prior to Admission medications   Medication Sig Start Date End Date Taking? Authorizing Provider  loratadine (CLARITIN) 10 MG tablet Take 10 mg by mouth daily.   Yes [provider]  ALPRAZolam (XANAX) 0.5 MG tablet Take 1 tablet (0.5 mg total) by mouth 3 (three) times daily as needed for sleep or anxiety. 10/20/16 10/20/17  Vicie Mutters, PA-C  azelastine (ASTELIN) 0.1 % nasal spray Place 2 sprays into both nostrils 2 (two) times daily. Use in each nostril as directed 04/03/16 04/03/17  Forcucci, Loma Sousa, PA-C  buPROPion (WELLBUTRIN XL) 150 MG 24 hr tablet TAKE 2 TABLETS BY MOUTH EVERY MORNING 11/02/16   Unk Pinto, MD  fluticasone [facility]) 50 MCG/ACT nasal spray USE 2 SPRAYS IN [facility] NOSTRIL EVERY DAY 02/10/16   Vicie Mutters, PA-C  fluticasone furoate-vilanterol (BREO ELLIPTA) 100-25 MCG/INH AEPB Inhale 1 puff into the lungs daily. Rinse mouth with water after each use 08/08/15   Vicie Mutters, PA-C  levonorgestrel (MIRENA) 20 MCG/24HR IUD  1 each by Intrauterine route once. 09/18/09   [provider]  montelukast (SINGULAIR) 10 MG tablet TAKE 1 TABLET BY MOUTH EVERY DAY 07/28/16   Unk Pinto, MD  ondansetron [facility]) 4 MG tablet Take 1 tablet (4 mg total) by mouth daily as needed for nausea or vomiting. 04/16/16 04/16/17  Vicie Mutters, PA-C  PROAIR HFA 108 [phone] Base) MCG/ACT inhaler INHALE 1-2 PUFFS EVERY 4 HOURS FOR WHEEZING OR SHORTNESS OF BREATH 08/12/16   Unk Pinto, MD  valACYclovir (VALTREX) 1000 MG tablet take 2 tablets by mouth twice a day 01/10/16   Unk Pinto, MD    Family History Family History  Problem Relation Age of Onset  . Hypertension Mother   . Diabetes Father   . Cancer Father        prostate  . Cancer Other        breast  . Hypertension Other   . Heart disease Other    Social History Social History  Substance Use Topics  . Smoking status: Never Smoker  . Smokeless tobacco: Never Used  . Alcohol use Yes     Comment: rare    Allergies   Patient has no known allergies.   Review of Systems Review of Systems  HENT: Positive for congestion.   Respiratory: Positive for shortness of breath. Negative for wheezing.   Cardiovascular: Positive for chest pain (intermittent) and palpitations. Negative for leg swelling.  Gastrointestinal: Positive for nausea. Negative for diarrhea and vomiting.  Skin: Negative for rash.  Neurological: Negative for headaches.   Physical Exam Updated Vital Signs BP 119/79   Pulse 86   Temp 98.8 F (37.1 C)   Resp 16   Ht 5\' 6"  (1.676 m)   Wt 68 kg (150 lb)   SpO2 100%   BMI 24.21 kg/m   Physical Exam  Constitutional: No distress.  HENT:  Head: Normocephalic.  Middle ear effusion bilaterally. No bulging, no erythema. Nose normal. Bilateral maxillary sinus tenderness. No frontal sinus tenderness.  Eyes: Conjunctivae are normal.  Neck: Neck supple.  Cardiovascular: Normal rate, regular rhythm and intact distal pulses.  Exam reveals no gallop and no friction rub.   Murmur heard.  Systolic murmur is present with a grade of 3/6  Pulmonary/Chest: Effort normal and breath sounds normal. No respiratory distress. She has no wheezes. She has no rales. She exhibits no tenderness.  Abdominal: Soft. She exhibits no distension. There is no tenderness. There is no rebound and no guarding.  Neurological: She is alert.  Skin: Skin is warm. Capillary refill takes less than 2 seconds. No rash noted.  Psychiatric: Her behavior is normal.  Nursing note and vitals reviewed.  ED Treatments / Results   DIAGNOSTIC STUDIES: Oxygen Saturation is 100% on RA, normal by my interpretation.  COORDINATION OF CARE: 6:13 PM-Discussed treatment plan with pt at bedside and pt agreed to plan.   Labs (all labs ordered are listed, but only abnormal results are displayed) Labs Reviewed  PREGNANCY, URINE  CBC  COMPREHENSIVE METABOLIC PANEL  TROPONIN I  D-DIMER, QUANTITATIVE (NOT AT [facility])    EKG  EKG Interpretation  Date/Time:  Monday November 09 2016 17:49:55 EDT Ventricular Rate:  73 PR Interval:  176 QRS Duration: 74 QT Interval:  380 QTC Calculation: 418 R Axis:   85 Text Interpretation:  Normal sinus rhythm Normal ECG since last tracing no significant change Confirmed by Malvin Johns [phone]) on 11/09/2016 7:25:52 PM Also confirmed by Malvin Johns [phone]), editor Oswaldo Milian, Beverly ([id number])  on 11/10/2016 7:35:59 AM       Radiology Dg Chest 2 View  Result Date: 11/09/2016 CLINICAL DATA:  One-week history of shortness of breath and pleuritic chest pain. Current history of asthma. EXAM: CHEST  2 VIEW COMPARISON:  09/19/2013, 07/31/2007, 927 1,005. FINDINGS: Cardiomediastinal silhouette unremarkable, unchanged. Mild central peribronchial thickening, more so than on the most recent prior examination. Lungs otherwise clear. No localized airspace consolidation. No pleural effusions. No pneumothorax. Normal pulmonary vascularity. IMPRESSION: Mild changes of acute bronchitis without focal airspace pneumonia. Electronically Signed   By: Evangeline Dakin M.D.   On: 11/09/2016 18:57    Procedures Procedures (including critical care time)  Medications Ordered in ED Medications - No data to display   Initial Impression / Assessment and Plan / ED Course  I have reviewed the triage vital signs and the nursing notes.  Pertinent labs & imaging results that were available during my care of the patient were reviewed by me and considered in my medical decision making (see chart for details).      Patient is to be discharged with recommendation to follow up with PCP in regards to today's hospital visit. Chest pain is not likely of cardiac or pulmonary etiology d/t presentation, perc negative, VSS, no tracheal deviation, no JVD or new murmur, RRR, breath sounds equal bilaterally, negative troponin. CXR consistent with acute bronchitis. SaO2 100%. NAD. No tachypnea. On EKG, no delta waves, ST segment elevation in V1 to V2, or prolonged QTc.   Will d/c the patient to home with follow up to PCP and cardiology for a possible holter monitor if her palpitations persist. Strict return to the ED is CP becomes exertional, associated with diaphoresis or nausea, radiates to left jaw/arm, worsens or becomes concerning in any way. Given the patient's symptoms and X-ray, ABX are not indicated at this time.   Pt appears reliable for follow up and is agreeable to discharge. Case has been discussed with and seen by Dr. Tamera Punt who agrees with the above plan to discharge.   Final Clinical Impressions(s) / ED Diagnoses   Final diagnoses:  Palpitations  Acute bronchitis, unspecified organism    New Prescriptions Discharge Medication List as of 11/09/2016  8:19 PM    I personally performed the services described in this documentation, which was scribed in my presence. The recorded information has been reviewed and is accurate.     Joline Maxcy A, PA-C 11/11/16 0340    Malvin Johns, MD 11/12/16 1600

## 2016-11-09 NOTE — ED Notes (Signed)
ED Provider at bedside. 

## 2016-11-09 NOTE — Discharge Instructions (Signed)
Please follow-up with your primary care provider regarding today's emergency department visit. Your chest x-ray was consistent with acute bronchitis. Bronchitis symptoms can last up to 3 weeks, but typically do not need antibiotics. Please continue to take your daily medications. Please discontinue the Claritin-D to see if your palpitations improve. If your symptoms persist, you can call and schedule a follow-up appointment with cardiology.   If you develop new or worsening symptoms including worsening chest pain, numbness or tingling, a loss of consciousness, high fever, severe pain and swelling in your lower extremities, please return to the emergency department for re-evaluation.

## 2016-11-09 NOTE — ED Notes (Signed)
Delay in xray. RN drawing labs

## 2016-11-09 NOTE — Telephone Encounter (Signed)
Pt called with complaint of shortness of breath & high blood pressure & high heart rate.  Pt called at 4.40 told pt it would be best for now to go to the ER because shes having these symptoms & no way to get her in here. Plus this is increased symptoms pt could barely speak due to sob

## 2016-12-28 ENCOUNTER — Other Ambulatory Visit: Payer: Self-pay | Admitting: Physician Assistant

## 2017-02-03 LAB — HM PAP SMEAR: HM Pap smear: POSITIVE

## 2017-02-12 DIAGNOSIS — Z01419 Encounter for gynecological examination (general) (routine) without abnormal findings: Secondary | ICD-10-CM | POA: Diagnosis not present

## 2017-02-12 DIAGNOSIS — Z6824 Body mass index (BMI) 24.0-24.9, adult: Secondary | ICD-10-CM | POA: Diagnosis not present

## 2017-02-26 DIAGNOSIS — R87612 Low grade squamous intraepithelial lesion on cytologic smear of cervix (LGSIL): Secondary | ICD-10-CM | POA: Diagnosis not present

## 2017-05-03 DIAGNOSIS — Z3201 Encounter for pregnancy test, result positive: Secondary | ICD-10-CM | POA: Diagnosis not present

## 2017-06-04 DIAGNOSIS — O021 Missed abortion: Secondary | ICD-10-CM | POA: Diagnosis not present

## 2017-06-07 ENCOUNTER — Other Ambulatory Visit: Payer: Self-pay | Admitting: Physician Assistant

## 2017-06-07 DIAGNOSIS — J45901 Unspecified asthma with (acute) exacerbation: Secondary | ICD-10-CM

## 2017-06-09 DIAGNOSIS — O021 Missed abortion: Secondary | ICD-10-CM | POA: Diagnosis not present

## 2017-06-14 ENCOUNTER — Encounter: Payer: Self-pay | Admitting: Physician Assistant

## 2017-06-14 ENCOUNTER — Other Ambulatory Visit: Payer: Self-pay | Admitting: Physician Assistant

## 2017-06-14 MED ORDER — OSELTAMIVIR PHOSPHATE 75 MG PO CAPS: 75.0000 mg | ORAL_CAPSULE | Freq: Every day | ORAL | 0 refills | Status: AC

## 2017-07-07 ENCOUNTER — Encounter: Payer: Self-pay | Admitting: Physician Assistant

## 2017-07-07 NOTE — Progress Notes (Signed)
Complete Physical  Assessment and Plan:  Vitamin D deficiency -     VITAMIN D 25 Hydroxy (Vit-D Deficiency, Fractures)  Anxiety Likely due to depression/grief Start back on wellbutrin She is on the xanax VERY rarely  Anemia, unspecified type -     Iron,Total/Total Iron Binding Cap -     Vitamin B12  Allergic state, subsequent encounter Continue over the counter  BMI 26.0-26.9,adult  Overweight  - long discussion about weight loss, diet, and exercise -recommended diet heavy in fruits and veggies and low in animal meats, cheeses, and dairy products  Other fatigue Likely due to depression/grief Start back on wellbutrin Check labs -     CBC with Differential/Platelet -     BASIC METABOLIC PANEL WITH GFR -     Hepatic function panel -     Iron,Total/Total Iron Binding Cap -     Vitamin B12 -     POCT urine pregnancy  Palpitations Likely from stress, check labs If any worsening symptoms go to ER -     TSH -     EKG 12-Lead  Screening for hematuria or proteinuria -     Urinalysis, Routine w reflex microscopic -     Microalbumin / creatinine urine ratio  Medication management -     CBC with Differential/Platelet -     BASIC METABOLIC PANEL WITH GFR -     Hepatic function panel -     Magnesium  Screening cholesterol level -     Lipid panel    Discussed med's effects and SE's. Screening labs and tests as requested with regular follow-up as recommended.  HPI  37 y.o. female  presents for a complete physical.  Dog died of cancer, husbands great aunt/aunt died, GF diagnosed with cancer, cousin died recently, father in law died 18-Jul-2022, dad diagnosed with cancer, and miscarriage at week 32 , bought a house and got a new puppy. She has restarted her wellbutrin back this week. She has nausea, fatigue, physically achey. No panic attacks, some trouble sleeping, eating a lot and not working out as much. Occ xanax at night, once every week at the most.   Her blood pressure has  been controlled at home, today their BP is BP: 110/72 She does workout, 3-4 times a week. She denies chest pain, shortness of breath, dizziness.  She is not on cholesterol medication and denies myalgias. Her cholesterol is at goal. The cholesterol last visit was:   Lab Results  Component Value Date   CHOL 142 07/02/2016   HDL 38 (L) 07/02/2016   LDLCALC 94 07/02/2016   TRIG 51 07/02/2016   CHOLHDL 3.7 07/02/2016   Last A1C in the office was:  Lab Results  Component Value Date   HGBA1C 5.4 06/28/2015  Patient is on Vitamin D supplement, not on it at this time, on prenatal.    Lab Results  Component Value Date   VD25OH 29 (L) 07/02/2016   Asthma well controlled, uses albuterol inhaler rarely Has not been taking wellbutrin consistently.  Has 36 year old twin girls, at [employer], going to get married to ex husband josh in 58 days, going to [place] for honeymoon.  BMI is Body mass index is 26.37 kg/m., she is working on diet and exercise. Wt Readings from Last 3 Encounters:  07/09/17 163 lb 6.4 oz (74.1 kg)  11/09/16 150 lb (68 kg)  07/02/16 151 lb (68.5 kg)    Current Medications:  Current Outpatient Medications on  File Prior to Visit  Medication Sig Dispense Refill  . ALPRAZolam (XANAX) 0.5 MG tablet Take 1 tablet (0.5 mg total) by mouth 3 (three) times daily as needed for sleep or anxiety. 30 tablet 0  . BREO ELLIPTA 100-25 MCG/INH AEPB inhale 1 PUFF into THE lungs ONCE A DAY. RINSE MOUTH WITH WATER AFTER EACH USE 60 each 0  . buPROPion (WELLBUTRIN XL) 150 MG 24 hr tablet TAKE 2 TABLETS BY MOUTH EVERY MORNING 60 tablet 2  . fluticasone (FLONASE) 50 MCG/ACT nasal spray USE 2 SPRAYS IN EACH NOSTRIL EVERY DAY 48 g 3  . loratadine (CLARITIN) 10 MG tablet Take 10 mg by mouth daily.    . montelukast (SINGULAIR) 10 MG tablet TAKE 1 TABLET BY MOUTH EVERY DAY 90 tablet 1  . PROAIR HFA 108 (90 Base) MCG/ACT inhaler INHALE 1-2 PUFFS EVERY 4 HOURS FOR WHEEZING OR SHORTNESS OF  BREATH 17 g 2  . valACYclovir (VALTREX) 1000 MG tablet take 2 tablets by mouth twice a day 360 tablet 0  . azelastine (ASTELIN) 0.1 % nasal spray Place 2 sprays into both nostrils 2 (two) times daily. Use in each nostril as directed 30 mL 2   No current facility-administered medications on file prior to visit.    Health Maintenance:   Immunization History  Administered Date(s) Administered  . Influenza-Unspecified 11/21/2012  . Tdap 05/10/2012   Tetanus: 04/2012 Pneumovax: N/A Flu vaccine: 2017 Zostavax: N/A  Pap:Dr. Lisbeth Renshaw- Mirena Oct 2018, abnormal pap- colposcopy normal.   MGM: N/A DEXA: N/A Colonoscopy: N/A EGD: N.A  Last Dental Exam: Dr. Aida Puffer Last Eye Exam: None, has been to [facility] in the past  Patient Care Team: Unk Pinto, MD as PCP - General (Internal Medicine)  Medical History:  Past Medical History:  Diagnosis Date  . Anemia    hx of  . Asthma    no problems since pregnancy  . Family history of anesthesia complication    "mom is sensitive- severe n/v"  . Fibrocystic breast   . Pneumonia    age 41  . PONV (postoperative nausea and vomiting)   . S/P laparoscopic cholecystectomy June 15 09/25/2013   Single small stone; chronic changes;  Small bile ducts   . Seasonal allergies   . Vitamin D deficiency    Allergies No Known Allergies  SURGICAL HISTORY She  has a past surgical history that includes Cesarean section (07/29/2009); Wisdom tooth extraction (2002); and Cholecystectomy (N/A, 09/25/2013). FAMILY HISTORY Her family history includes Cancer in her father and other; Diabetes in her father; Heart disease in her other; Hypertension in her mother and other. SOCIAL HISTORY She  reports that she has never smoked. She has never used smokeless tobacco. She reports that she drinks alcohol. She reports that she does not use drugs.  Review of Systems: Review of Systems  Constitutional: Positive for malaise/fatigue. Negative for chills, diaphoresis,  fever and weight loss.  HENT: Negative for congestion, ear discharge, ear pain, hearing loss, nosebleeds, sore throat and tinnitus.   Eyes: Negative.   Respiratory: Negative.  Negative for stridor.   Cardiovascular: Negative.   Gastrointestinal: Negative for abdominal pain, blood in stool, constipation, diarrhea, heartburn, melena, nausea and vomiting.  Genitourinary: Negative.   Musculoskeletal: Negative.   Skin: Negative.   Neurological: Negative.  Negative for weakness and headaches.  Endo/Heme/Allergies: Negative.   Psychiatric/Behavioral: Negative.     Physical Exam: Estimated body mass index is 26.37 kg/m as calculated from the following:   Height as of  this encounter: 5\' 6"  (1.676 m).   Weight as of this encounter: 163 lb 6.4 oz (74.1 kg). BP 110/72   Pulse 74   Temp 98.2 F (36.8 C)   Resp 14   Ht 5\' 6"  (1.676 m)   Wt 163 lb 6.4 oz (74.1 kg)   LMP 04/06/2017   SpO2 97%   BMI 26.37 kg/m  General Appearance: Well nourished, in no apparent distress.  Eyes: PERRLA, EOMs, conjunctiva no swelling or erythema, normal fundi and vessels.  Sinuses: No Frontal/maxillary tenderness  ENT/Mouth: Ext aud canals clear, normal light reflex with TMs without erythema, bulging. Good dentition. No erythema, swelling, or exudate on post pharynx. Tonsils + swelling with tonsil stone on left side. Hearing normal.  Neck: Supple, thyroid normal. No bruits  Respiratory: Respiratory effort normal, BS equal bilaterally without rales, rhonchi, wheezing or stridor.  Cardio: RRR without murmurs, rubs or gallops. Brisk peripheral pulses without edema.  Chest: symmetric, with normal excursions and percussion.  Breasts: defer OB/GYN Abdomen: Soft, nontender, no guarding, rebound, hernias, masses, or organomegaly. .  Lymphatics: Non tender without lymphadenopathy.  Genitourinary: defer OB/GYN Musculoskeletal: Full ROM all peripheral extremities,5/5 strength, and normal gait.  Skin: Warm, dry without  rashes, lesions, ecchymosis. Neuro: Cranial nerves intact, reflexes equal bilaterally. Normal muscle tone, no cerebellar symptoms. Sensation intact.  Psych: Awake and oriented X 3, normal affect, Insight and Judgment appropriate.   EKG: defer  Vicie Mutters 10:09 AM [facility]

## 2017-07-09 ENCOUNTER — Encounter: Payer: Self-pay | Admitting: Physician Assistant

## 2017-07-09 ENCOUNTER — Ambulatory Visit: Payer: 59 | Admitting: Physician Assistant

## 2017-07-09 VITALS — BP 110/72 | HR 74 | Temp 98.2°F | Resp 14 | Ht 66.0 in | Wt 163.4 lb

## 2017-07-09 DIAGNOSIS — Z136 Encounter for screening for cardiovascular disorders: Secondary | ICD-10-CM

## 2017-07-09 DIAGNOSIS — I10 Essential (primary) hypertension: Secondary | ICD-10-CM

## 2017-07-09 DIAGNOSIS — R5383 Other fatigue: Secondary | ICD-10-CM

## 2017-07-09 DIAGNOSIS — Z6826 Body mass index (BMI) 26.0-26.9, adult: Secondary | ICD-10-CM

## 2017-07-09 DIAGNOSIS — F419 Anxiety disorder, unspecified: Secondary | ICD-10-CM

## 2017-07-09 DIAGNOSIS — T7840XD Allergy, unspecified, subsequent encounter: Secondary | ICD-10-CM

## 2017-07-09 DIAGNOSIS — E559 Vitamin D deficiency, unspecified: Secondary | ICD-10-CM | POA: Diagnosis not present

## 2017-07-09 DIAGNOSIS — D649 Anemia, unspecified: Secondary | ICD-10-CM | POA: Diagnosis not present

## 2017-07-09 DIAGNOSIS — R6889 Other general symptoms and signs: Secondary | ICD-10-CM

## 2017-07-09 DIAGNOSIS — Z1389 Encounter for screening for other disorder: Secondary | ICD-10-CM

## 2017-07-09 DIAGNOSIS — Z13220 Encounter for screening for lipoid disorders: Secondary | ICD-10-CM

## 2017-07-09 DIAGNOSIS — R002 Palpitations: Secondary | ICD-10-CM

## 2017-07-09 DIAGNOSIS — Z79899 Other long term (current) drug therapy: Secondary | ICD-10-CM

## 2017-07-09 DIAGNOSIS — Z0001 Encounter for general adult medical examination with abnormal findings: Secondary | ICD-10-CM | POA: Diagnosis not present

## 2017-07-09 LAB — POCT URINE PREGNANCY: Preg Test, Ur: NEGATIVE

## 2017-07-09 NOTE — Patient Instructions (Signed)
Counseling services  Here are some numbers below you can try but I suggest calling your insurance and finding out who is in your network and THEN calling those people or looking them up on google.   I'm a big fan of Cognitive Behavioral Therapy, look this up on You tube or check with the therapist you see if they are certified.  This form of therapy helps to teach you skills to better handle with current situation that are causing anxiety or depression.    [facility].  Address: [place], [place] [address] [facility] [phone] office [email or web address] [address], [place], [place], [place] [address]  [facility] [address] [place], [place] [address] [phone]  Karin Golden Ph.D., clinical psychologist [phone] office [place], [place] [address] Cognitive Behavior Therapy, Depression, Bipolar, Anxiety, Grief and Loss    Complicated Grieving Grief is a normal response to the death of someone close to you. Feelings of fear, anger, and guilt can affect almost everyone who loses a loved one. It is also common to have symptoms of depression while you are grieving. These include problems with sleep, loss of appetite, and lack of energy. They may last for weeks or months after a loss. Complicated grief is different from normal grief or depression. Normal grieving involves sadness and feelings of loss, but these feelings are not constant. Complicated grief is a constant and severe type of grief. It interferes with your ability to function normally. It may last for several months to a year or longer. Complicated grief may require treatment from a mental health care provider. What are the causes? It is not known why some people continue to struggle with grief and others do not. You may be at higher risk for complicated grief if:  The death of  your loved one was sudden or unexpected.  The death of your loved one was due to a violent event.  Your loved one committed suicide.  Your loved one was a child or a young person.  You were very close to or dependent on the loved one.  You have a history of depression.  What are the signs or symptoms? Signs and symptoms of complicated grief may include:  Feeling disbelief or numbness.  Being unable to enjoy good memories of your loved one.  Needing to avoid anything that reminds you of your loved one.  Being unable to stop thinking about the death.  Feeling intense anger or guilt.  Feeling alone and hopeless.  Feeling that your life is meaningless and empty.  Losing the desire to live.  How is this diagnosed? Your health care provider may diagnose complicated grief if:  You have constant symptoms of grief for 6-12 months or longer.  Your symptoms are interfering with your ability to live your life.  Your health care provider may want you to see a mental health care provider. Many symptoms of depression are similar to the symptoms of complicated grief. It is important to be evaluated for complicated grief along with other mental health conditions. How is this treated? Talk therapy with a mental health provider is the most common treatment for complicated grief. During therapy, you will learn healthy ways to cope with the loss of your loved one. In some cases, your mental health care provider may also recommend antidepressant medicines. Follow these instructions at home:  Take care of yourself. ? Eat regular  meals and maintain a healthy diet. Eat plenty of fruits, vegetables, and whole grains. ? Try to get some exercise each day. ? Keep regular hours for sleep. Try to get at least 8 hours of sleep each night.  Do not use drugs or alcohol to ease your symptoms.  Take medicines only as directed by your health care provider.  Spend time with friends and loved  ones.  Consider joining a grief (bereavement) support group to help you deal with your loss.  Keep all follow-up visits as directed by your health care provider. This is important. Contact a health care provider if:  Your symptoms keep you from functioning normally.  Your symptoms do not get better with treatment. Get help right away if:  You have serious thoughts of hurting yourself or someone else.  You have suicidal feelings. This information is not intended to replace advice given to you by your health care provider. Make sure you discuss any questions you have with your health care provider. Document Released: 04/06/2005 Document Revised: 09/12/2015 Document Reviewed: 09/14/2013 Elsevier Interactive Patient Education  [employer].

## 2017-07-10 LAB — TSH: TSH: 1.92 mIU/L

## 2017-07-10 LAB — CBC WITH DIFFERENTIAL/PLATELET
BASOS ABS: 57 {cells}/uL (ref 0–200)
Basophils Relative: 0.7 %
EOS PCT: 1.1 %
Eosinophils Absolute: 90 cells/uL (ref 15–500)
HEMATOCRIT: 37.6 % (ref 35.0–45.0)
HEMOGLOBIN: 12.8 g/dL (ref 11.7–15.5)
LYMPHS ABS: 2345 {cells}/uL (ref 850–3900)
MCH: 29.5 pg (ref 27.0–33.0)
MCHC: 34 g/dL (ref 32.0–36.0)
MCV: 86.6 fL (ref 80.0–100.0)
MPV: 11.4 fL (ref 7.5–12.5)
Monocytes Relative: 5.3 %
NEUTROS ABS: 5273 {cells}/uL (ref 1500–7800)
NEUTROS PCT: 64.3 %
Platelets: 295 10*3/uL (ref 140–400)
RBC: 4.34 10*6/uL (ref 3.80–5.10)
RDW: 12.1 % (ref 11.0–15.0)
Total Lymphocyte: 28.6 %
WBC: 8.2 10*3/uL (ref 3.8–10.8)
WBCMIX: 435 {cells}/uL (ref 200–950)

## 2017-07-10 LAB — HEPATIC FUNCTION PANEL
AG RATIO: 1.6 (calc) (ref 1.0–2.5)
ALBUMIN MSPROF: 4.5 g/dL (ref 3.6–5.1)
ALT: 16 U/L (ref 6–29)
AST: 25 U/L (ref 10–30)
Alkaline phosphatase (APISO): 58 U/L (ref 33–115)
BILIRUBIN DIRECT: 0.1 mg/dL (ref 0.0–0.2)
BILIRUBIN TOTAL: 0.4 mg/dL (ref 0.2–1.2)
Globulin: 2.9 g/dL (calc) (ref 1.9–3.7)
Indirect Bilirubin: 0.3 mg/dL (calc) (ref 0.2–1.2)
Total Protein: 7.4 g/dL (ref 6.1–8.1)

## 2017-07-10 LAB — LIPID PANEL
CHOL/HDL RATIO: 3.4 (calc) (ref <5.0)
Cholesterol: 166 mg/dL (ref <200)
HDL: 49 mg/dL — ABNORMAL LOW (ref >50)
LDL CHOLESTEROL (CALC): 103 mg/dL — AB
NON-HDL CHOLESTEROL (CALC): 117 mg/dL (ref <130)
TRIGLYCERIDES: 54 mg/dL (ref <150)

## 2017-07-10 LAB — MICROALBUMIN / CREATININE URINE RATIO
Creatinine, Urine: 124 mg/dL (ref 20–275)
MICROALB UR: 0.6 mg/dL
Microalb Creat Ratio: 5 mcg/mg creat (ref <30)

## 2017-07-10 LAB — BASIC METABOLIC PANEL WITH GFR
BUN: 10 mg/dL (ref 7–25)
CO2: 24 mmol/L (ref 20–32)
CREATININE: 0.76 mg/dL (ref 0.50–1.10)
Calcium: 9.3 mg/dL (ref 8.6–10.2)
Chloride: 106 mmol/L (ref 98–110)
GFR, EST AFRICAN AMERICAN: 118 mL/min/{1.73_m2} (ref >=60)
GFR, EST NON AFRICAN AMERICAN: 102 mL/min/{1.73_m2} (ref >=60)
Glucose, Bld: 88 mg/dL (ref 65–99)
POTASSIUM: 4 mmol/L (ref 3.5–5.3)
Sodium: 138 mmol/L (ref 135–146)

## 2017-07-10 LAB — URINALYSIS, ROUTINE W REFLEX MICROSCOPIC
BILIRUBIN URINE: NEGATIVE
GLUCOSE, UA: NEGATIVE
Hgb urine dipstick: NEGATIVE
KETONES UR: NEGATIVE
Leukocytes, UA: NEGATIVE
Nitrite: NEGATIVE
Protein, ur: NEGATIVE
SPECIFIC GRAVITY, URINE: 1.023 (ref 1.001–1.03)

## 2017-07-10 LAB — VITAMIN D 25 HYDROXY (VIT D DEFICIENCY, FRACTURES): Vit D, 25-Hydroxy: 23 ng/mL — ABNORMAL LOW (ref 30–100)

## 2017-07-10 LAB — VITAMIN B12: VITAMIN B 12: 1025 pg/mL (ref 200–1100)

## 2017-07-10 LAB — MAGNESIUM: Magnesium: 2 mg/dL (ref 1.5–2.5)

## 2017-07-10 LAB — IRON, TOTAL/TOTAL IRON BINDING CAP
%SAT: 16 % (ref 11–50)
Iron: 56 ug/dL (ref 40–190)
TIBC: 347 ug/dL (ref 250–450)

## 2017-08-06 ENCOUNTER — Encounter: Payer: Self-pay | Admitting: Physician Assistant

## 2017-08-10 ENCOUNTER — Other Ambulatory Visit: Payer: Self-pay | Admitting: Adult Health

## 2017-08-16 DIAGNOSIS — M25522 Pain in left elbow: Secondary | ICD-10-CM | POA: Diagnosis not present

## 2017-08-30 DIAGNOSIS — M25532 Pain in left wrist: Secondary | ICD-10-CM | POA: Diagnosis not present

## 2017-09-03 DIAGNOSIS — O09291 Supervision of pregnancy with other poor reproductive or obstetric history, first trimester: Secondary | ICD-10-CM | POA: Diagnosis not present

## 2017-09-03 DIAGNOSIS — Z3A01 Less than 8 weeks gestation of pregnancy: Secondary | ICD-10-CM | POA: Diagnosis not present

## 2017-09-03 DIAGNOSIS — Z3200 Encounter for pregnancy test, result unknown: Secondary | ICD-10-CM | POA: Diagnosis not present

## 2017-09-27 DIAGNOSIS — J320 Chronic maxillary sinusitis: Secondary | ICD-10-CM | POA: Diagnosis not present

## 2017-09-30 DIAGNOSIS — Z3201 Encounter for pregnancy test, result positive: Secondary | ICD-10-CM | POA: Diagnosis not present

## 2017-10-12 ENCOUNTER — Other Ambulatory Visit: Payer: Self-pay | Admitting: Physician Assistant

## 2017-10-12 DIAGNOSIS — J45901 Unspecified asthma with (acute) exacerbation: Secondary | ICD-10-CM

## 2017-10-22 DIAGNOSIS — O09511 Supervision of elderly primigravida, first trimester: Secondary | ICD-10-CM | POA: Diagnosis not present

## 2017-10-22 LAB — OB RESULTS CONSOLE ANTIBODY SCREEN: Antibody Screen: NEGATIVE

## 2017-10-22 LAB — OB RESULTS CONSOLE RUBELLA ANTIBODY, IGM: Rubella: IMMUNE

## 2017-10-22 LAB — OB RESULTS CONSOLE ABO/RH: RH Type: NEGATIVE

## 2017-10-22 LAB — OB RESULTS CONSOLE HEPATITIS B SURFACE ANTIGEN: Hepatitis B Surface Ag: NEGATIVE

## 2017-10-22 LAB — OB RESULTS CONSOLE GC/CHLAMYDIA
Chlamydia: NEGATIVE
Gonorrhea: NEGATIVE

## 2017-10-22 LAB — OB RESULTS CONSOLE HIV ANTIBODY (ROUTINE TESTING): HIV: NONREACTIVE

## 2017-10-22 LAB — OB RESULTS CONSOLE RPR: RPR: NONREACTIVE

## 2017-11-08 DIAGNOSIS — R3915 Urgency of urination: Secondary | ICD-10-CM | POA: Diagnosis not present

## 2017-12-09 ENCOUNTER — Other Ambulatory Visit: Payer: Self-pay | Admitting: Adult Health

## 2017-12-17 DIAGNOSIS — O09512 Supervision of elderly primigravida, second trimester: Secondary | ICD-10-CM | POA: Diagnosis not present

## 2017-12-17 DIAGNOSIS — Z3A18 18 weeks gestation of pregnancy: Secondary | ICD-10-CM | POA: Diagnosis not present

## 2017-12-27 ENCOUNTER — Ambulatory Visit (INDEPENDENT_AMBULATORY_CARE_PROVIDER_SITE_OTHER): Payer: 59 | Admitting: Adult Health

## 2017-12-27 ENCOUNTER — Encounter: Payer: Self-pay | Admitting: Adult Health

## 2017-12-27 ENCOUNTER — Other Ambulatory Visit: Payer: Self-pay | Admitting: Physician Assistant

## 2017-12-27 VITALS — BP 128/84 | HR 101 | Temp 97.9°F | Ht 66.0 in | Wt 176.0 lb

## 2017-12-27 DIAGNOSIS — J069 Acute upper respiratory infection, unspecified: Secondary | ICD-10-CM | POA: Diagnosis not present

## 2017-12-27 DIAGNOSIS — B9789 Other viral agents as the cause of diseases classified elsewhere: Secondary | ICD-10-CM

## 2017-12-27 MED ORDER — AZITHROMYCIN 250 MG PO TABS: ORAL_TABLET | ORAL | 1 refills | Status: AC

## 2017-12-27 MED ORDER — CROMOLYN SODIUM 20 MG/2ML IN NEBU: 20.0000 mg | INHALATION_SOLUTION | Freq: Four times a day (QID) | RESPIRATORY_TRACT | 1 refills | Status: DC | PRN

## 2017-12-27 NOTE — Patient Instructions (Signed)
Cromolyn Sodium inhalation aerosol What is this medicine? CROMOLYN SODIUM (KROE moe lin SOE dee um) helps reduce inflammation. This medicine is used to treat the symptoms of asthma. It is also used to prevent bronchospasm from exercise or irritants. Never use this medicine to treat an acute asthma attack. This medicine may be used for other purposes; ask your health care provider or pharmacist if you have questions. COMMON BRAND NAME(S): Intal What should I tell my health care provider before I take this medicine? They need to know if you have any of these conditions: -heart disease -irregular heartbeats -kidney disease -liver disease -an unusual or allergic reaction to cromolyn, fluorocarbons, other medicines, foods, dyes, or preservatives -pregnant or trying to get pregnant -breast-feeding How should I use this medicine? Inhale this medicine through the mouth. Follow the directions on your prescription label. Take your medicine at regular intervals. Do not take your medicine more often than directed. Do not stop taking except on your doctor's advice. Make sure that you are using your inhaler correctly. Ask you doctor or health care provider if you have any questions. Talk to your pediatrician regarding the use of this medicine in children. While this drug may be prescribed for children as young as 81 years old for selected conditions, precautions do apply. Overdosage: If you think you have taken too much of this medicine contact a poison control center or emergency room at once. NOTE: This medicine is only for you. Do not share this medicine with others. What if I miss a dose? If you miss a dose, use it as soon as you can. Then space remaining doses evenly throughout the rest of the day. Do not use double or extra doses. What may interact with this medicine? Interactions are not expected. This list may not describe all possible interactions. Give your health care provider a list of all the  medicines, herbs, non-prescription drugs, or dietary supplements you use. Also tell them if you smoke, drink alcohol, or use illegal drugs. Some items may interact with your medicine. What should I watch for while using this medicine? Visit your doctor or health care professional for regular checks on your progress. Tell your doctor if your symptoms do not improve. If your symptoms get worse or if you need your short-acting inhalers more often, call your doctor right away. Do not get this medicine in your eyes. It can cause irritation. If you get a bitter or unpleasant taste in your mouth, gargle or rinse your mouth after you use this medicine. If you use this medicine to prevent a bronchospasm from exercise or an irritant, use it 10 to 15 minutes, but not more than 60 minutes, before exposure. What side effects may I notice from receiving this medicine? Side effects that you should report to your doctor or health care professional as soon as possible: -allergic reactions like skin rash, itching or hives, swelling of the face, lips, or tongue -breathing difficulty, wheezing -dizzy -fever, infection -joint pain, swelling -passing urine more often -redness, blistering, peeling or loosening of the skin, including inside the mouth -unusually weak or tired Side effects that usually do not require medical attention (report to your doctor or health care professional if they continue or are bothersome): -bad taste -cough -headache -heartburn -irritated, dry throat -nausea -stuffy nose This list may not describe all possible side effects. Call your doctor for medical advice about side effects. You may report side effects to FDA at 1-800-FDA-1088. Where should I keep my medicine?  Keep out of the reach of children. Store at room temperature between 15 and 30 degrees C (59 and 86 degrees F). Keep a record of the number of sprays used. Discard the canister after the 122 or 200 sprays are used. See your  medicine package for number of sprays that your inhaler holds. Throw away any unused medicine after the expiration date. NOTE: This sheet is a summary. It may not cover all possible information. If you have questions about this medicine, talk to your doctor, pharmacist, or health care provider.  2018 Elsevier/Gold Standard (2008-03-22 15:26:09)

## 2017-12-27 NOTE — Progress Notes (Signed)
Assessment and Plan:  Christie Le was seen today for uri.  Diagnoses and all orders for this visit:  Viral URI with cough/ asthma Discussed likely viral, self-limited and should start to resolve Discussed the importance of avoiding unnecessary antibiotic therapy. Suggested symptomatic OTC remedies. Ok to use albuterol if needed but can try neb cromolyn to help resolve symptoms Robitussin for cough -     cromolyn (INTAL) 20 MG/2ML nebulizer solution; Take 2 mLs (20 mg total) by nebulization 4 (four) times daily as needed for wheezing.  Other orders    Fill and take if symptoms improve then relapse suddenly -     azithromycin (ZITHROMAX) 250 MG tablet; Take 2 tablets (500 mg) on  Day 1,  followed by 1 tablet (250 mg) once daily on Days 2 through 5.   Further disposition pending results of labs. Discussed med's effects and SE's.   Over 30 minutes of exam, counseling, chart review, and critical decision making was performed.   Future Appointments  Date Time Provider [place]  07/15/2018  9:30 AM Vicie Mutters, PA-C [id number] None    ------------------------------------------------------------------------------------------------------------------   HPI BP 128/84   Pulse (!) 101   Temp 97.9 F (36.6 C)   Ht 5\' 6"  (1.676 m)   Wt 176 lb (79.8 kg)   LMP 04/06/2017   SpO2 98%   BMI 28.41 kg/m   37 y.o.female , [redacted] weeks pregnant, presents for evaluation of URI symptoms; began 5 days ago with sore throat and mild cough which has progressed, deep, but not productive, nasal congestion. Endorses mild fevers. She reports prior to onset of symptoms both daughters had similar symptoms and were prescribed amoxicillin by pediatrician and have both fully recovered. Fever controlled by tylenol, and has been using nasocort and saline irrigations. She contacted OBGYN and was prescribed zpak thought this didn't seem to help. Continues with dry cough.   She has hx of asthma, currently taking  ventolin only, typically on BREO  She has two daughters who were both ill prior to her but are now resolved  Past Medical History:  Diagnosis Date  . Anemia    hx of  . Asthma    no problems since pregnancy  . Family history of anesthesia complication    "mom is sensitive- severe n/v"  . Fibrocystic breast   . Pneumonia    age 14  . PONV (postoperative nausea and vomiting)   . S/P laparoscopic cholecystectomy June 15 09/25/2013   Single small stone; chronic changes;  Small bile ducts   . Seasonal allergies   . Vitamin D deficiency      No Known Allergies  Current Outpatient Medications on File Prior to Visit  Medication Sig  . Doxylamine-Pyridoxine (DICLEGIS PO) Take by mouth.  . loratadine (CLARITIN) 10 MG tablet Take 10 mg by mouth daily.  . Prenatal Vit-Fe Fumarate-FA (PRENATAL PO) Take by mouth.  Marland Kitchen PROAIR HFA 108 (90 Base) MCG/ACT inhaler INHALE 1-2 PUFFS EVERY 4 HOURS FOR WHEEZING OR SHORTNESS OF BREATH  . Triamcinolone Acetonide (NASACORT ALLERGY 24HR NA) Place into the nose.  Marland Kitchen azelastine (ASTELIN) 0.1 % nasal spray Place 2 sprays into both nostrils 2 (two) times daily. Use in each nostril as directed  . BREO ELLIPTA 100-25 MCG/INH AEPB inhale 1 PUFF into THE lungs ONCE A DAY. RINSE MOUTH WITH WATER AFTER EACH USE (Patient not taking: Reported on 12/27/2017)  . buPROPion (WELLBUTRIN XL) 150 MG 24 hr tablet TAKE 2 TABLETS BY MOUTH EVERY MORNING (Patient  not taking: Reported on 12/27/2017)  . fluticasone (FLONASE) 50 MCG/ACT nasal spray USE 2 SPRAYS IN EACH NOSTRIL EVERY DAY (Patient not taking: Reported on 12/27/2017)  . montelukast (SINGULAIR) 10 MG tablet TAKE 1 TABLET BY MOUTH EVERY DAY (Patient not taking: Reported on 12/27/2017)  . valACYclovir (VALTREX) 1000 MG tablet take 2 tablets by mouth twice a day (Patient not taking: Reported on 12/27/2017)   No current facility-administered medications on file prior to visit.     ROS: all negative except above.   Physical  Exam:  BP 128/84   Pulse (!) 101   Temp 97.9 F (36.6 C)   Ht 5\' 6"  (1.676 m)   Wt 176 lb (79.8 kg)   LMP 04/06/2017   SpO2 98%   BMI 28.41 kg/m   General Appearance: Well nourished, in no apparent distress. Eyes: PERRLA, EOMs, conjunctiva no swelling or erythema Sinuses: No Frontal/maxillary tenderness ENT/Mouth: Ext aud canals clear, TMs without erythema, bulging. No erythema, swelling, or exudate on post pharynx.  Tonsils not swollen or erythematous. Hearing normal.  Neck: Supple, thyroid normal.  Respiratory: Respiratory effort normal, BS equal bilaterally without rales, rhonchi, wheezing or stridor.  Cardio: RRR with no MRGs. Brisk peripheral pulses without edema.  Abdomen: Soft, + BS.  Non tender, no guarding, rebound, hernias, masses. Lymphatics: Non tender without lymphadenopathy.  Musculoskeletal: Full ROM, 5/5 strength, normal gait.  Skin: Warm, dry without rashes, lesions, ecchymosis.  Neuro: Cranial nerves intact. Normal muscle tone, no cerebellar symptoms. Sensation intact.  Psych: Awake and oriented X 3, normal affect, Insight and Judgment appropriate.     Izora Ribas, NP 12:53 PM [facility]

## 2017-12-28 ENCOUNTER — Other Ambulatory Visit: Payer: Self-pay | Admitting: Adult Health

## 2017-12-28 MED ORDER — AEROCHAMBER MV MISC: 1.0000 | Freq: Once | 0 refills | Status: AC

## 2018-01-06 DIAGNOSIS — O09512 Supervision of elderly primigravida, second trimester: Secondary | ICD-10-CM | POA: Diagnosis not present

## 2018-01-06 DIAGNOSIS — Z3A21 21 weeks gestation of pregnancy: Secondary | ICD-10-CM | POA: Diagnosis not present

## 2018-01-24 ENCOUNTER — Ambulatory Visit (INDEPENDENT_AMBULATORY_CARE_PROVIDER_SITE_OTHER): Payer: 59 | Admitting: Internal Medicine

## 2018-01-24 ENCOUNTER — Encounter: Payer: Self-pay | Admitting: Internal Medicine

## 2018-01-24 VITALS — BP 114/74 | HR 88 | Temp 97.3°F | Resp 16 | Ht 66.0 in | Wt 184.4 lb

## 2018-01-24 DIAGNOSIS — D509 Iron deficiency anemia, unspecified: Secondary | ICD-10-CM

## 2018-01-24 DIAGNOSIS — H1013 Acute atopic conjunctivitis, bilateral: Secondary | ICD-10-CM | POA: Diagnosis not present

## 2018-01-24 DIAGNOSIS — E559 Vitamin D deficiency, unspecified: Secondary | ICD-10-CM | POA: Diagnosis not present

## 2018-01-24 MED ORDER — PREDNISOLONE ACETATE 1 % OP SUSP: 1.0000 [drp] | Freq: Four times a day (QID) | OPHTHALMIC | 0 refills | Status: DC

## 2018-01-24 NOTE — Progress Notes (Signed)
  Subjective:    Patient ID: Christie Le, female    DOB: [birth year], 37 y.o.   MRN: [id number]  HPI  This very nice 37 yo MWF , Gravid (due in Dec w/a 3rd girl) presents with c/o intermittent itching if yes and awoke this am with a sub-conjunctival hemorrhage of her Lt eye. Also has hx/o low iron preceding pregnancy and has hx/o repeated low Vit D levels ~ 22.   Medication Sig  . BREO ELLIPTA 100-25 MCG/INH AEPB inhale 1 PUFF into THE lungs ONCE A DAY. RINSE MOUTH WITH WATER AFTER EACH USE  . buPROPion (WELLBUTRIN XL) 150 MG 24 hr tablet TAKE 2 TABLETS BY MOUTH EVERY MORNING  . cromolyn (INTAL) 20 MG/2ML nebulizer solution Take 2 mLs (20 mg total) by nebulization 4 (four) times daily as needed for wheezing.  . Doxylamine-Pyridoxine (DICLEGIS PO) Take by mouth.  . fluticasone (FLONASE) 50 MCG/ACT nasal spray USE 2 SPRAYS IN EACH NOSTRIL EVERY DAY  . loratadine (CLARITIN) 10 MG tablet Take 10 mg by mouth daily.  . montelukast (SINGULAIR) 10 MG tablet TAKE 1 TABLET BY MOUTH EVERY DAY  . Prenatal Vit-Fe Fumarate-FA (PRENATAL PO) Take by mouth.  Marland Kitchen PROAIR HFA 108 (90 Base) MCG/ACT inhaler INHALE 1-2 PUFFS EVERY 4 HOURS FOR WHEEZING OR SHORTNESS OF BREATH  . promethazine-dextromethorphan (PROMETHAZINE-DM) 6.25-15 MG/5ML syrup Take 5 mLs by mouth 4 times daily as needed for cough.  . Triamcinolone Acetonide (NASACORT ALLERGY 24HR NA) Place into the nose.  . valACYclovir (VALTREX) 1000 MG tablet take 2 tablets by mouth twice a day  . azelastine (ASTELIN) 0.1 % nasal spray Place 2 sprays into both nostrils 2 (two) times daily. Use in each nostril as directed   No Known Allergies Past Medical History:  Diagnosis Date  . Anemia    hx of  . Asthma    no problems since pregnancy  . Family history of anesthesia complication    "mom is sensitive- severe n/v"  . Fibrocystic breast   . Pneumonia    age 27  . PONV (postoperative nausea and vomiting)   . S/P laparoscopic cholecystectomy June 15  09/25/2013   Single small stone; chronic changes;  Small bile ducts   . Seasonal allergies   . Vitamin D deficiency    Review of Systems   10 point systems review negative except as above.    Objective:   Physical Exam / BP 114/74   Pulse 88   Temp (!) 97.3 F (36.3 C)   Resp 16   Ht 5\' 6"  (1.676 m)   Wt 184 lb 6.4 oz (83.6 kg)   LMP 04/06/2017   BMI 29.76 kg/m   HEENT - WNL except a small subconjunctival hemorrhage at 6 o'clock in the Lt eye. PERRLA. VA - Nl Neck - supple.  Chest - Clear equal BS. Cor - Nl HS. RRR w/o sig MGR. PP 1(+). No edema. MS- FROM w/o deformities.  Gait Nl. Neuro -  Nl w/o focal abnormalities.    Assessment & Plan:   1. Allergic conjunctivitis of both eyes  - prednisoLONE acetate (PRED FORTE) 1 % ophthalmic suspension; Place 1 drop into both eyes 4 (four) times daily.  Dispense: 5 mL; Refill: 0  2. Iron deficiency anemia  - CBC with Differential/Platelet - Iron,Total/Total Iron Binding Cap  3. Vitamin D deficiency  - VITAMIN D 25 Hydroxy

## 2018-01-24 NOTE — Patient Instructions (Signed)
Allergic Conjunctivitis, Adult      Allergic conjunctivitis is inflammation of the clear membrane that covers the white part of your eye and the inner surface of your eyelid (conjunctiva). The inflammation is caused by allergies. The blood vessels in the conjunctiva become inflamed and this causes the eyes to become red or pink. The eyes often feel itchy. Allergic conjunctivitis cannot be spread from one person to another person (is not contagious). What are the causes? This condition is caused by an allergic reaction. Common causes of an allergic reaction (allergens) include:  Outdoor allergens, such as: ? Pollen. ? Grass and weeds. ? Mold spores.  Indoor allergens, such as: ? Dust. ? Smoke. ? Mold. ? Pet dander. ? Animal hair.  What increases the risk? You may be more likely to develop this condition if you have a family history of allergies, such as:  Allergic rhinitis.  Bronchial asthma.  Atopic dermatitis.  What are the signs or symptoms? Symptoms of this condition include eyes that are:  Itchy.  Red.  Watery.  Puffy.  Your eyes may also:  Sting or burn.  Have clear drainage coming from them.  How is this diagnosed? This condition may be diagnosed by medical history and physical exam. If you have drainage from your eyes, it may be tested to rule out other causes of conjunctivitis. You may also need to see a health care provider who specializes in treating allergies (allergist) or eye conditions (ophthalmologist) for tests to confirm the diagnosis. You may have:  Skin tests to see which allergens are causing your symptoms. These tests involve pricking the skin with a tiny needle and exposing the skin to small amounts of potential allergens to see if your skin reacts.  Blood tests.  Tissue scrapings from your eyelid. These will be examined under a microscope.  How is this treated? Treatments for this condition may include:  Cold cloths (compresses) to  soothe itching and swelling.  Washing the face to remove allergens.  Eye drops. These may be prescription or over-the-counter. There are several different types. You may need to try different types to see which one works best for you. Your may need: ? Eye drops that block the allergic reaction (antihistamine). ? Eye drops that reduce swelling and irritation (anti-inflammatory). ? Steroid eye drops to lessen a severe reaction (vernal conjunctivitis).  Oral antihistamine medicines to reduce your allergic reaction. You may need these if eye drops do not help or are difficult to use.  Follow these instructions at home:  Avoid known allergens whenever possible.  Take or apply over-the-counter and prescription medicines only as told by your health care provider. These include any eye drops.  Apply a cool, clean washcloth to your eye for 10-20 minutes, 3-4 times a day.  Do not touch or rub your eyes.  Do not wear contact lenses until the inflammation is gone. Wear glasses instead.  Do not wear eye makeup until the inflammation is gone.  Keep all follow-up visits as told by your health care provider. This is important. Contact a health care provider if:  Your symptoms get worse or do not improve with treatment.  You have mild eye pain.  You have sensitivity to light.  You have spots or blisters on your eyes.  You have pus draining from your eye.  You have a fever. Get help right away if:  You have redness, swelling, or other symptoms in only one eye.  Your vision is blurred or you have   vision changes.  You have severe eye pain. This information is not intended to replace advice given to you by your health care provider. Make sure you discuss any questions you have with your health care provider. Document Released: 06/27/2002 Document Revised: 12/04/2015 Document Reviewed: 10/18/2015 Elsevier Interactive Patient Education  2018 Elsevier Inc.  

## 2018-01-25 LAB — CBC WITH DIFFERENTIAL/PLATELET
BASOS ABS: 36 {cells}/uL (ref 0–200)
Basophils Relative: 0.3 %
EOS ABS: 71 {cells}/uL (ref 15–500)
EOS PCT: 0.6 %
HCT: 29.4 % — ABNORMAL LOW (ref 35.0–45.0)
HEMOGLOBIN: 10 g/dL — AB (ref 11.7–15.5)
Lymphs Abs: 1916 cells/uL (ref 850–3900)
MCH: 30.5 pg (ref 27.0–33.0)
MCHC: 34 g/dL (ref 32.0–36.0)
MCV: 89.6 fL (ref 80.0–100.0)
MONOS PCT: 4.9 %
MPV: 11.1 fL (ref 7.5–12.5)
NEUTROS PCT: 78.1 %
Neutro Abs: 9294 cells/uL — ABNORMAL HIGH (ref 1500–7800)
PLATELETS: 276 10*3/uL (ref 140–400)
RBC: 3.28 10*6/uL — ABNORMAL LOW (ref 3.80–5.10)
RDW: 13.4 % (ref 11.0–15.0)
TOTAL LYMPHOCYTE: 16.1 %
WBC mixed population: 583 cells/uL (ref 200–950)
WBC: 11.9 10*3/uL — ABNORMAL HIGH (ref 3.8–10.8)

## 2018-01-25 LAB — IRON, TOTAL/TOTAL IRON BINDING CAP
%SAT: 13 % (calc) — ABNORMAL LOW (ref 16–45)
IRON: 61 ug/dL (ref 40–190)
TIBC: 469 mcg/dL (calc) — ABNORMAL HIGH (ref 250–450)

## 2018-01-25 LAB — VITAMIN D 25 HYDROXY (VIT D DEFICIENCY, FRACTURES): VIT D 25 HYDROXY: 25 ng/mL — AB (ref 30–100)

## 2018-02-22 DIAGNOSIS — O26899 Other specified pregnancy related conditions, unspecified trimester: Secondary | ICD-10-CM | POA: Diagnosis not present

## 2018-02-25 DIAGNOSIS — O99810 Abnormal glucose complicating pregnancy: Secondary | ICD-10-CM | POA: Diagnosis not present

## 2018-02-25 DIAGNOSIS — Z3A28 28 weeks gestation of pregnancy: Secondary | ICD-10-CM | POA: Diagnosis not present

## 2018-03-10 DIAGNOSIS — O321XX0 Maternal care for breech presentation, not applicable or unspecified: Secondary | ICD-10-CM | POA: Diagnosis not present

## 2018-03-10 DIAGNOSIS — O09513 Supervision of elderly primigravida, third trimester: Secondary | ICD-10-CM | POA: Diagnosis not present

## 2018-03-10 DIAGNOSIS — Z3A30 30 weeks gestation of pregnancy: Secondary | ICD-10-CM | POA: Diagnosis not present

## 2018-03-10 DIAGNOSIS — O133 Gestational [pregnancy-induced] hypertension without significant proteinuria, third trimester: Secondary | ICD-10-CM | POA: Diagnosis not present

## 2018-03-19 ENCOUNTER — Encounter: Payer: Self-pay | Admitting: Student

## 2018-03-19 ENCOUNTER — Inpatient Hospital Stay (HOSPITAL_COMMUNITY)
Admission: AD | Admit: 2018-03-19 | Discharge: 2018-03-19 | Disposition: A | Discharge status: Home / Self Care | Payer: 59 | Source: Ambulatory Visit | Attending: Obstetrics and Gynecology | Admitting: Obstetrics and Gynecology

## 2018-03-19 DIAGNOSIS — R030 Elevated blood-pressure reading, without diagnosis of hypertension: Secondary | ICD-10-CM | POA: Insufficient documentation

## 2018-03-19 DIAGNOSIS — M545 Low back pain, unspecified: Secondary | ICD-10-CM

## 2018-03-19 DIAGNOSIS — D649 Anemia, unspecified: Secondary | ICD-10-CM | POA: Diagnosis not present

## 2018-03-19 DIAGNOSIS — Z3A31 31 weeks gestation of pregnancy: Secondary | ICD-10-CM | POA: Diagnosis not present

## 2018-03-19 DIAGNOSIS — Z9889 Other specified postprocedural states: Secondary | ICD-10-CM | POA: Insufficient documentation

## 2018-03-19 DIAGNOSIS — Z9049 Acquired absence of other specified parts of digestive tract: Secondary | ICD-10-CM | POA: Insufficient documentation

## 2018-03-19 DIAGNOSIS — Z79899 Other long term (current) drug therapy: Secondary | ICD-10-CM | POA: Diagnosis not present

## 2018-03-19 DIAGNOSIS — R109 Unspecified abdominal pain: Secondary | ICD-10-CM | POA: Diagnosis present

## 2018-03-19 DIAGNOSIS — O26893 Other specified pregnancy related conditions, third trimester: Secondary | ICD-10-CM

## 2018-03-19 LAB — COMPREHENSIVE METABOLIC PANEL
ALT: 12 U/L (ref 0–44)
AST: 19 U/L (ref 15–41)
Albumin: 3 g/dL — ABNORMAL LOW (ref 3.5–5.0)
Alkaline Phosphatase: 87 U/L (ref 38–126)
Anion gap: 8 (ref 5–15)
BUN: 6 mg/dL (ref 6–20)
CO2: 21 mmol/L — ABNORMAL LOW (ref 22–32)
Calcium: 8.8 mg/dL — ABNORMAL LOW (ref 8.9–10.3)
Chloride: 108 mmol/L (ref 98–111)
Creatinine, Ser: 0.43 mg/dL — ABNORMAL LOW (ref 0.44–1.00)
GFR calc Af Amer: >60 mL/min (ref >60)
GFR calc non Af Amer: >60 mL/min (ref >60)
Glucose, Bld: 87 mg/dL (ref 70–99)
Potassium: 3.7 mmol/L (ref 3.5–5.1)
Sodium: 137 mmol/L (ref 135–145)
Total Bilirubin: 0.2 mg/dL — ABNORMAL LOW (ref 0.3–1.2)
Total Protein: 6.7 g/dL (ref 6.5–8.1)

## 2018-03-19 LAB — URINALYSIS, ROUTINE W REFLEX MICROSCOPIC
Bilirubin Urine: NEGATIVE
GLUCOSE, UA: NEGATIVE mg/dL
Ketones, ur: NEGATIVE mg/dL
Leukocytes, UA: NEGATIVE
Nitrite: NEGATIVE
Protein, ur: NEGATIVE mg/dL
Specific Gravity, Urine: 1.011 (ref 1.005–1.030)
pH: 7 (ref 5.0–8.0)

## 2018-03-19 LAB — CBC
HEMATOCRIT: 31.1 % — AB (ref 36.0–46.0)
Hemoglobin: 10.2 g/dL — ABNORMAL LOW (ref 12.0–15.0)
MCH: 30.8 pg (ref 26.0–34.0)
MCHC: 32.8 g/dL (ref 30.0–36.0)
MCV: 94 fL (ref 80.0–100.0)
Platelets: 252 10*3/uL (ref 150–400)
RBC: 3.31 MIL/uL — ABNORMAL LOW (ref 3.87–5.11)
RDW: 14.4 % (ref 11.5–15.5)
WBC: 11.2 10*3/uL — ABNORMAL HIGH (ref 4.0–10.5)
nRBC: 0 % (ref 0.0–0.2)

## 2018-03-19 LAB — PROTEIN / CREATININE RATIO, URINE
Creatinine, Urine: 71 mg/dL
Protein Creatinine Ratio: 0.11 mg/mg{Cre} (ref 0.00–0.15)
Total Protein, Urine: 8 mg/dL

## 2018-03-19 MED ORDER — OXYCODONE-ACETAMINOPHEN 5-325 MG PO TABS
2.0000 | ORAL_TABLET | Freq: Once | ORAL | Status: AC
  Administered 2018-03-19: 2 via ORAL
  Filled 2018-03-19: qty 2

## 2018-03-19 NOTE — Discharge Instructions (Signed)

## 2018-03-19 NOTE — MAU Provider Note (Cosign Needed)
History     CSN: [id number]  Arrival date and time: 03/19/18 1204   First Provider Initiated Contact with Patient 03/19/18 1331      Chief Complaint  Patient presents with  . Abdominal Pain  . Back Pain   [name] presents with low back pain She states that pain started last night Feels like constant pain that also has some cramping Pain radiates around to the low abdomen and pelvic region May also be having contractions, but unsure because never labored before States that pregnancy has been uncomplicated with the exception of home blood pressure monitoring Has had several elevated blood pressures at work above 140/90 since last visit with Ob Also reports blurry vision  Denies HA, N/V, upper abdominal pain, pain or burning with urination, vaginal pain, itching or burning, fevers, history of kidney stones     Past Medical History:  Diagnosis Date  . Anemia    hx of  . Asthma    no problems since pregnancy  . Family history of anesthesia complication    "mom is sensitive- severe n/v"  . Fibrocystic breast   . Pneumonia    age 29  . PONV (postoperative nausea and vomiting)   . S/P laparoscopic cholecystectomy June 15 09/25/2013   Single small stone; chronic changes;  Small bile ducts   . Seasonal allergies   . Vitamin D deficiency     Past Surgical History:  Procedure Laterality Date  . CESAREAN SECTION  07/29/2009  . CHOLECYSTECTOMY N/A 09/25/2013   Procedure: LAPAROSCOPIC CHOLECYSTECTOMY WITH INTRAOPERATIVE CHOLANGIOGRAM;  Surgeon: Pedro Earls, MD;  Location: [facility];  Service: General;  Laterality: N/A;  . WISDOM TOOTH EXTRACTION  2002    Family History  Problem Relation Age of Onset  . Hypertension Mother   . Diabetes Father   . Cancer Father        prostate  . Cancer Other        breast  . Hypertension Other   . Heart disease Other     Social History   Tobacco Use  . Smoking status: Never Smoker  . Smokeless tobacco: Never Used  Substance Use  Topics  . Alcohol use: Not Currently    Comment: rare  . Drug use: No    Allergies: No Known Allergies  Medications Prior to Admission  Medication Sig Dispense Refill Last Dose  . azelastine (ASTELIN) 0.1 % nasal spray Place 2 sprays into both nostrils 2 (two) times daily. Use in each nostril as directed 30 mL 2 Taking  . BREO ELLIPTA 100-25 MCG/INH AEPB inhale 1 PUFF into THE lungs ONCE A DAY. RINSE MOUTH WITH WATER AFTER EACH USE 60 each 0 Taking  . buPROPion (WELLBUTRIN XL) 150 MG 24 hr tablet TAKE 2 TABLETS BY MOUTH EVERY MORNING 60 tablet 2 Taking  . cromolyn (INTAL) 20 MG/2ML nebulizer solution Take 2 mLs (20 mg total) by nebulization 4 (four) times daily as needed for wheezing. 30 mL 1 Taking  . Doxylamine-Pyridoxine (DICLEGIS PO) Take by mouth.   Taking  . fluticasone (FLONASE) 50 MCG/ACT nasal spray USE 2 SPRAYS IN EACH NOSTRIL EVERY DAY 48 g 3 Taking  . loratadine (CLARITIN) 10 MG tablet Take 10 mg by mouth daily.   Taking  . montelukast (SINGULAIR) 10 MG tablet TAKE 1 TABLET BY MOUTH EVERY DAY 90 tablet 1 Taking  . prednisoLONE acetate (PRED FORTE) 1 % ophthalmic suspension Place 1 drop into both eyes 4 (four) times daily. 5 mL 0   .  Prenatal Vit-Fe Fumarate-FA (PRENATAL PO) Take by mouth.   Taking  . PROAIR HFA 108 (90 Base) MCG/ACT inhaler INHALE 1-2 PUFFS EVERY 4 HOURS FOR WHEEZING OR SHORTNESS OF BREATH 17 g 2 Taking  . promethazine-dextromethorphan (PROMETHAZINE-DM) 6.25-15 MG/5ML syrup Take 5 mLs by mouth 4 times daily as needed for cough. 240 mL 1 Taking  . Triamcinolone Acetonide (NASACORT ALLERGY 24HR NA) Place into the nose.   Taking  . valACYclovir (VALTREX) 1000 MG tablet take 2 tablets by mouth twice a day 360 tablet 0 Taking    Review of Systems  All other systems reviewed and are negative.  Physical Exam   Blood pressure 119/61, pulse 77, temperature 97.9 F (36.6 C), temperature source Oral, resp. rate 19, weight 90.8 kg, last menstrual period 04/06/2017,  SpO2 98 %.  Physical Exam  Constitutional: She is oriented to person, place, and time. She appears well-developed and well-nourished. No distress.  HENT:  Head: Normocephalic and atraumatic.  Eyes: Right eye exhibits no discharge. Left eye exhibits no discharge. No scleral icterus.  Cardiovascular: Normal rate and regular rhythm.  Respiratory: Effort normal. No respiratory distress.  Neurological: She is alert and oriented to person, place, and time.  Skin: Skin is warm and dry. No rash noted. She is not diaphoretic.  Psychiatric: She has a normal mood and affect. Her behavior is normal. Judgment and thought content normal.    MAU Course  Procedures  MDM Presents for low back pain that appears to be of MSK origin Reports hx of elevated blood pressures and new onset blurry vision during patient interview Obtained [place] labs which were normal; also all blood pressure measurements less than 140/90 here today  Assessment and Plan  Low back pain during pregnancy in third trimester - Plan: Discharge patient - pain likely MSK in nature - no urinary symptoms or signs of infection - no contractions on toco  - improved with percocet in MAU - plan to discharge home  Reported Blood Pressure Elevation - PIH labs negative - discussed proper technique for BP measurement - plan to take home machine into office for calibration - return precautions discussed - follow up at next scheduled visit or PRN   Anemia in pregnancy - discussed iron rich food consumption and optimization of iron absorption  - follow up at next office visit   [place] Fellow  03/19/2018, 2:11 PM

## 2018-03-19 NOTE — MAU Note (Signed)
Christie Le is a 37 y.o. at [redacted]w[redacted]d here in MAU reporting: +lower back pain that radiates around to abdomen. Intermittent and cramping in nature.  Onset of complaint: started this morning Pain score: 8/10. Took tylenol around 930 this morning with no relief. Vitals:   03/19/18 1221  BP: 136/78  Pulse: 98  Resp: 19  Temp: 97.9 F (36.6 C)  SpO2: 98%     +FM Lab orders placed from triage: ua

## 2018-03-24 DIAGNOSIS — R319 Hematuria, unspecified: Secondary | ICD-10-CM | POA: Diagnosis not present

## 2018-03-24 DIAGNOSIS — R3989 Other symptoms and signs involving the genitourinary system: Secondary | ICD-10-CM | POA: Diagnosis not present

## 2018-03-29 ENCOUNTER — Other Ambulatory Visit: Payer: Self-pay | Admitting: Obstetrics & Gynecology

## 2018-03-29 DIAGNOSIS — R319 Hematuria, unspecified: Secondary | ICD-10-CM

## 2018-03-30 ENCOUNTER — Ambulatory Visit
Admission: RE | Admit: 2018-03-30 | Discharge: 2018-03-30 | Disposition: A | Discharge status: Home / Self Care | Payer: 59 | Source: Ambulatory Visit | Attending: Obstetrics & Gynecology | Admitting: Obstetrics & Gynecology

## 2018-03-30 DIAGNOSIS — R319 Hematuria, unspecified: Secondary | ICD-10-CM | POA: Diagnosis not present

## 2018-04-08 DIAGNOSIS — Z3483 Encounter for supervision of other normal pregnancy, third trimester: Secondary | ICD-10-CM | POA: Diagnosis not present

## 2018-04-11 ENCOUNTER — Other Ambulatory Visit: Payer: Self-pay | Admitting: Obstetrics & Gynecology

## 2018-04-15 DIAGNOSIS — Z3483 Encounter for supervision of other normal pregnancy, third trimester: Secondary | ICD-10-CM | POA: Diagnosis not present

## 2018-04-15 DIAGNOSIS — Z3482 Encounter for supervision of other normal pregnancy, second trimester: Secondary | ICD-10-CM | POA: Diagnosis not present

## 2018-04-22 ENCOUNTER — Telehealth (HOSPITAL_COMMUNITY): Payer: Self-pay | Admitting: *Deleted

## 2018-04-22 NOTE — Telephone Encounter (Signed)
Preadmission screen  

## 2018-04-25 ENCOUNTER — Telehealth (HOSPITAL_COMMUNITY): Payer: Self-pay | Admitting: *Deleted

## 2018-04-25 NOTE — Telephone Encounter (Signed)
Preadmission screen  

## 2018-04-26 ENCOUNTER — Encounter (HOSPITAL_COMMUNITY): Payer: Self-pay

## 2018-04-26 ENCOUNTER — Encounter (HOSPITAL_COMMUNITY): Payer: Self-pay | Admitting: *Deleted

## 2018-04-26 ENCOUNTER — Inpatient Hospital Stay (HOSPITAL_COMMUNITY): Payer: 59 | Admitting: Anesthesiology

## 2018-04-26 ENCOUNTER — Inpatient Hospital Stay (HOSPITAL_COMMUNITY)
Admission: AD | Admit: 2018-04-26 | Discharge: 2018-04-30 | DRG: 788 | Disposition: A | Discharge status: Home / Self Care | Payer: 59 | Attending: Obstetrics and Gynecology | Admitting: Obstetrics and Gynecology

## 2018-04-26 ENCOUNTER — Encounter (HOSPITAL_COMMUNITY)
Admission: AD | Disposition: A | Discharge status: Home / Self Care | Payer: Self-pay | Source: Home / Self Care | Attending: Obstetrics and Gynecology

## 2018-04-26 DIAGNOSIS — O34219 Maternal care for unspecified type scar from previous cesarean delivery: Secondary | ICD-10-CM

## 2018-04-26 DIAGNOSIS — J45909 Unspecified asthma, uncomplicated: Secondary | ICD-10-CM | POA: Diagnosis present

## 2018-04-26 DIAGNOSIS — E876 Hypokalemia: Secondary | ICD-10-CM | POA: Diagnosis present

## 2018-04-26 DIAGNOSIS — Z98891 History of uterine scar from previous surgery: Secondary | ICD-10-CM

## 2018-04-26 DIAGNOSIS — O26893 Other specified pregnancy related conditions, third trimester: Secondary | ICD-10-CM | POA: Diagnosis present

## 2018-04-26 DIAGNOSIS — O134 Gestational [pregnancy-induced] hypertension without significant proteinuria, complicating childbirth: Principal | ICD-10-CM | POA: Diagnosis present

## 2018-04-26 DIAGNOSIS — Z6791 Unspecified blood type, Rh negative: Secondary | ICD-10-CM

## 2018-04-26 DIAGNOSIS — D649 Anemia, unspecified: Secondary | ICD-10-CM | POA: Diagnosis present

## 2018-04-26 DIAGNOSIS — F419 Anxiety disorder, unspecified: Secondary | ICD-10-CM | POA: Diagnosis present

## 2018-04-26 DIAGNOSIS — O321XX0 Maternal care for breech presentation, not applicable or unspecified: Secondary | ICD-10-CM

## 2018-04-26 DIAGNOSIS — O99344 Other mental disorders complicating childbirth: Secondary | ICD-10-CM | POA: Diagnosis present

## 2018-04-26 DIAGNOSIS — O139 Gestational [pregnancy-induced] hypertension without significant proteinuria, unspecified trimester: Secondary | ICD-10-CM

## 2018-04-26 DIAGNOSIS — O9952 Diseases of the respiratory system complicating childbirth: Secondary | ICD-10-CM | POA: Diagnosis present

## 2018-04-26 DIAGNOSIS — O133 Gestational [pregnancy-induced] hypertension without significant proteinuria, third trimester: Secondary | ICD-10-CM

## 2018-04-26 DIAGNOSIS — O34211 Maternal care for low transverse scar from previous cesarean delivery: Secondary | ICD-10-CM | POA: Diagnosis present

## 2018-04-26 DIAGNOSIS — Z3A37 37 weeks gestation of pregnancy: Secondary | ICD-10-CM

## 2018-04-26 DIAGNOSIS — O9902 Anemia complicating childbirth: Secondary | ICD-10-CM | POA: Diagnosis present

## 2018-04-26 HISTORY — DX: History of uterine scar from previous surgery: Z98.891

## 2018-04-26 HISTORY — DX: Gestational (pregnancy-induced) hypertension without significant proteinuria, unspecified trimester: O13.9

## 2018-04-26 HISTORY — DX: Maternal care for breech presentation, not applicable or unspecified: O32.1XX0

## 2018-04-26 LAB — COMPREHENSIVE METABOLIC PANEL
ALT: 13 U/L (ref 0–44)
ANION GAP: 8 (ref 5–15)
AST: 21 U/L (ref 15–41)
Albumin: 3 g/dL — ABNORMAL LOW (ref 3.5–5.0)
Alkaline Phosphatase: 103 U/L (ref 38–126)
BUN: 8 mg/dL (ref 6–20)
CO2: 19 mmol/L — ABNORMAL LOW (ref 22–32)
Calcium: 8.6 mg/dL — ABNORMAL LOW (ref 8.9–10.3)
Chloride: 107 mmol/L (ref 98–111)
Creatinine, Ser: 0.55 mg/dL (ref 0.44–1.00)
GFR calc non Af Amer: >60 mL/min (ref >60)
Glucose, Bld: 116 mg/dL — ABNORMAL HIGH (ref 70–99)
Potassium: 3.3 mmol/L — ABNORMAL LOW (ref 3.5–5.1)
SODIUM: 134 mmol/L — AB (ref 135–145)
Total Bilirubin: 0.3 mg/dL (ref 0.3–1.2)
Total Protein: 6.7 g/dL (ref 6.5–8.1)

## 2018-04-26 LAB — PROTEIN / CREATININE RATIO, URINE
Creatinine, Urine: 40 mg/dL
Protein Creatinine Ratio: 0.18 mg/mg{Cre} — ABNORMAL HIGH (ref 0.00–0.15)
Total Protein, Urine: 7 mg/dL

## 2018-04-26 LAB — CBC
HCT: 32.6 % — ABNORMAL LOW (ref 36.0–46.0)
Hemoglobin: 10.7 g/dL — ABNORMAL LOW (ref 12.0–15.0)
MCH: 30.5 pg (ref 26.0–34.0)
MCHC: 32.8 g/dL (ref 30.0–36.0)
MCV: 92.9 fL (ref 80.0–100.0)
PLATELETS: 201 10*3/uL (ref 150–400)
RBC: 3.51 MIL/uL — AB (ref 3.87–5.11)
RDW: 14.5 % (ref 11.5–15.5)
WBC: 12.7 10*3/uL — ABNORMAL HIGH (ref 4.0–10.5)
nRBC: 0 % (ref 0.0–0.2)

## 2018-04-26 LAB — URINALYSIS, ROUTINE W REFLEX MICROSCOPIC
Bilirubin Urine: NEGATIVE
Glucose, UA: NEGATIVE mg/dL
Hgb urine dipstick: NEGATIVE
KETONES UR: NEGATIVE mg/dL
LEUKOCYTES UA: NEGATIVE
NITRITE: NEGATIVE
Protein, ur: NEGATIVE mg/dL
Specific Gravity, Urine: 1.005 (ref 1.005–1.030)
pH: 7 (ref 5.0–8.0)

## 2018-04-26 SURGERY — Surgical Case
Anesthesia: Spinal

## 2018-04-26 MED ORDER — FAMOTIDINE IN NACL 20-0.9 MG/50ML-% IV SOLN
20.0000 mg | Freq: Once | INTRAVENOUS | Status: AC
  Administered 2018-04-26: 20 mg via INTRAVENOUS
  Filled 2018-04-26: qty 50

## 2018-04-26 MED ORDER — LABETALOL HCL 5 MG/ML IV SOLN: 80.0000 mg | INTRAVENOUS | Status: DC | PRN

## 2018-04-26 MED ORDER — SCOPOLAMINE 1 MG/3DAYS TD PT72
MEDICATED_PATCH | TRANSDERMAL | Status: DC | PRN
  Administered 2018-04-26: 1 via TRANSDERMAL

## 2018-04-26 MED ORDER — LABETALOL HCL 5 MG/ML IV SOLN: 40.0000 mg | INTRAVENOUS | Status: DC | PRN

## 2018-04-26 MED ORDER — PHENYLEPHRINE 8 MG IN D5W 100 ML (0.08MG/ML) PREMIX OPTIME
INJECTION | INTRAVENOUS | Status: AC
  Filled 2018-04-26: qty 100

## 2018-04-26 MED ORDER — FENTANYL CITRATE (PF) 100 MCG/2ML IJ SOLN
INTRAMUSCULAR | Status: AC
  Filled 2018-04-26: qty 2

## 2018-04-26 MED ORDER — BUPIVACAINE HCL (PF) 0.25 % IJ SOLN
INTRAMUSCULAR | Status: AC
  Filled 2018-04-26: qty 10

## 2018-04-26 MED ORDER — CEFAZOLIN SODIUM-DEXTROSE 2-4 GM/100ML-% IV SOLN
2.0000 g | INTRAVENOUS | Status: AC
  Administered 2018-04-26: 2 g via INTRAVENOUS

## 2018-04-26 MED ORDER — MORPHINE SULFATE (PF) 0.5 MG/ML IJ SOLN
INTRAMUSCULAR | Status: AC
  Filled 2018-04-26: qty 10

## 2018-04-26 MED ORDER — LABETALOL HCL 5 MG/ML IV SOLN: 20.0000 mg | INTRAVENOUS | Status: DC | PRN

## 2018-04-26 MED ORDER — PHENYLEPHRINE 8 MG IN D5W 100 ML (0.08MG/ML) PREMIX OPTIME
INJECTION | INTRAVENOUS | Status: DC | PRN
  Administered 2018-04-26: 60 ug/min via INTRAVENOUS

## 2018-04-26 MED ORDER — HYDRALAZINE HCL 20 MG/ML IJ SOLN: 10.0000 mg | INTRAMUSCULAR | Status: DC | PRN

## 2018-04-26 MED ORDER — SOD CITRATE-CITRIC ACID 500-334 MG/5ML PO SOLN
30.0000 mL | Freq: Once | ORAL | Status: AC
  Administered 2018-04-26: 30 mL via ORAL
  Filled 2018-04-26: qty 15

## 2018-04-26 MED ORDER — LACTATED RINGERS IV SOLN
INTRAVENOUS | Status: DC
  Administered 2018-04-26 (×2): via INTRAVENOUS

## 2018-04-26 MED ORDER — ONDANSETRON HCL 4 MG/2ML IJ SOLN
INTRAMUSCULAR | Status: DC | PRN
  Administered 2018-04-26: 4 mg via INTRAVENOUS

## 2018-04-26 MED ORDER — SCOPOLAMINE 1 MG/3DAYS TD PT72
MEDICATED_PATCH | TRANSDERMAL | Status: AC
  Filled 2018-04-26: qty 1

## 2018-04-26 SURGICAL SUPPLY — 44 items
ADH SKN CLS APL DERMABOND .7 (GAUZE/BANDAGES/DRESSINGS) ×1
APL SKNCLS STERI-STRIP NONHPOA (GAUZE/BANDAGES/DRESSINGS) ×1
BARRIER ADHS 3X4 INTERCEED (GAUZE/BANDAGES/DRESSINGS) ×1 IMPLANT
BENZOIN TINCTURE PRP APPL 2/3 (GAUZE/BANDAGES/DRESSINGS) ×1 IMPLANT
BRR ADH 4X3 ABS CNTRL BYND (GAUZE/BANDAGES/DRESSINGS) ×1
CHLORAPREP W/TINT 26ML (MISCELLANEOUS) ×2 IMPLANT
CLAMP CORD UMBIL (MISCELLANEOUS) IMPLANT
CLOTH BEACON ORANGE TIMEOUT ST (SAFETY) ×2 IMPLANT
DECANTER SPIKE VIAL GLASS SM (MISCELLANEOUS) ×1 IMPLANT
DERMABOND ADVANCED (GAUZE/BANDAGES/DRESSINGS) ×1
DERMABOND ADVANCED .7 DNX12 (GAUZE/BANDAGES/DRESSINGS) IMPLANT
DRAPE C SECTION CLR SCREEN (DRAPES) ×1 IMPLANT
DRSG OPSITE POSTOP 4X10 (GAUZE/BANDAGES/DRESSINGS) ×2 IMPLANT
ELECT REM PT RETURN 9FT ADLT (ELECTROSURGICAL) ×2
ELECTRODE REM PT RTRN 9FT ADLT (ELECTROSURGICAL) ×1 IMPLANT
EXTRACTOR VACUUM KIWI (MISCELLANEOUS) IMPLANT
EXTRACTOR VACUUM M CUP 4 TUBE (SUCTIONS) IMPLANT
GLOVE BIO SURGEON STRL SZ7 (GLOVE) ×2 IMPLANT
GLOVE BIOGEL PI IND STRL 7.0 (GLOVE) ×2 IMPLANT
GLOVE BIOGEL PI INDICATOR 7.0 (GLOVE) ×2
GOWN STRL REUS W/TWL LRG LVL3 (GOWN DISPOSABLE) ×4 IMPLANT
KIT ABG SYR 3ML LUER SLIP (SYRINGE) IMPLANT
NDL HYPO 25X5/8 SAFETYGLIDE (NEEDLE) IMPLANT
NEEDLE HYPO 25X5/8 SAFETYGLIDE (NEEDLE) IMPLANT
NS IRRIG 1000ML POUR BTL (IV SOLUTION) ×2 IMPLANT
PACK C SECTION WH (CUSTOM PROCEDURE TRAY) ×2 IMPLANT
PAD OB MATERNITY 4.3X12.25 (PERSONAL CARE ITEMS) ×2 IMPLANT
RTRCTR C-SECT PINK 25CM LRG (MISCELLANEOUS) ×1 IMPLANT
SPONGE LAP 18X18 RF (DISPOSABLE) ×6 IMPLANT
STRIP CLOSURE SKIN 1/2X4 (GAUZE/BANDAGES/DRESSINGS) ×1 IMPLANT
SUT MNCRL 0 VIOLET CTX 36 (SUTURE) ×2 IMPLANT
SUT MONOCRYL 0 CTX 36 (SUTURE) ×2
SUT PLAIN 0 NONE (SUTURE) IMPLANT
SUT PLAIN 2 0 (SUTURE)
SUT PLAIN ABS 2-0 CT1 27XMFL (SUTURE) IMPLANT
SUT VIC AB 0 CT1 27 (SUTURE) ×4
SUT VIC AB 0 CT1 27XBRD ANBCTR (SUTURE) ×2 IMPLANT
SUT VIC AB 2-0 CT1 27 (SUTURE) ×2
SUT VIC AB 2-0 CT1 TAPERPNT 27 (SUTURE) ×1 IMPLANT
SUT VIC AB 4-0 KS 27 (SUTURE) ×2 IMPLANT
SUT VICRYL 0 TIES 12 18 (SUTURE) IMPLANT
TOWEL OR 17X24 6PK STRL BLUE (TOWEL DISPOSABLE) ×2 IMPLANT
TRAY FOLEY W/BAG SLVR 14FR LF (SET/KITS/TRAYS/PACK) IMPLANT
WATER STERILE IRR 1000ML POUR (IV SOLUTION) ×1 IMPLANT

## 2018-04-26 NOTE — Anesthesia Preprocedure Evaluation (Addendum)
Anesthesia Evaluation  Patient identified by MRN, date of birth, ID band Patient awake    Reviewed: Allergy & Precautions, NPO status , Patient's Chart, lab work & pertinent test results  History of Anesthesia Complications (+) PONV and history of anesthetic complications  Airway Mallampati: II  TM Distance: >3 FB Neck ROM: Full    Dental  (+) Teeth Intact   Pulmonary asthma ,    Pulmonary exam normal breath sounds clear to auscultation       Cardiovascular hypertension, Normal cardiovascular exam Rhythm:Regular Rate:Normal     Neuro/Psych Anxiety negative neurological ROS     GI/Hepatic Neg liver ROS, GERD  Medicated and Controlled,  Endo/Other  negative endocrine ROS  Renal/GU negative Renal ROS     Musculoskeletal negative musculoskeletal ROS (+)   Abdominal   Peds  Hematology negative hematology ROS (+)   Anesthesia Other Findings Day of surgery medications reviewed with the patient.  Reproductive/Obstetrics (+) Pregnancy Hx of C/S x1                           Anesthesia Physical Anesthesia Plan  ASA: II  Anesthesia Plan: Spinal   Post-op Pain Management:    Induction:   PONV Risk Score and Plan: 3 and Treatment may vary due to age or medical condition, Ondansetron, Dexamethasone and Scopolamine patch - Pre-op  Airway Management Planned: Natural Airway  Additional Equipment:   Intra-op Plan:   Post-operative Plan:   Informed Consent: I have reviewed the patients History and Physical, chart, labs and discussed the procedure including the risks, benefits and alternatives for the proposed anesthesia with the patient or authorized representative who has indicated his/her understanding and acceptance.     Plan Discussed with: CRNA  Anesthesia Plan Comments:        Anesthesia Quick Evaluation

## 2018-04-26 NOTE — MAU Provider Note (Signed)
Obstetric Attending MAU Note  Chief Complaint:  Hypertension and Dizziness   First Provider Initiated Contact with Patient 04/26/18 1729     HPI: Christie Le is a 38 y.o. [id number] at [redacted]w[redacted]d who presents to maternity admissions reporting dizziness and light headedness. Gets care at [place]. Pregnancy complicated by breech presentation and prior c-section. BP's WNL during [facility]. Checked BP today at work [employer]) and noted it to be very high in the 160-170/90-100 range. First pressure on arrival here was also high in severe range. Has since come down, but remains in the 130-140/80-90 range. Denies contractions, leakage of fluid or vaginal bleeding. Good fetal movement.   Pregnancy Course: Receives care at [facility] Patient Active Problem List   Diagnosis Date Noted  . 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 Allergy   . Anemia   . Anxiety   . Vitamin D deficiency     Past Medical History:  Diagnosis Date  . Anemia    hx of  . Asthma    no problems since pregnancy  . Family history of anesthesia complication    "mom is sensitive- severe n/v"  . Fibrocystic breast   . Pneumonia    age 46  . PONV (postoperative nausea and vomiting)   . Pregnancy induced hypertension   . S/P laparoscopic cholecystectomy June 15 09/25/2013   Single small stone; chronic changes;  Small bile ducts   . Seasonal allergies   . Vitamin D deficiency     OB History  Gravida Para Term Preterm AB Living  3 1 1   1 2   SAB TAB Ectopic Multiple Live Births  1     1 2     # Outcome Date GA Lbr Len/2nd Weight Sex Delivery Anes PTL Lv  3 Current           2A Term 07/29/09     CS-LTranv   LIV  2B Term 07/29/09     CS-LTranv   LIV  1 SAB             Past Surgical History:  Procedure Laterality Date  . CESAREAN SECTION  07/29/2009  . CHOLECYSTECTOMY N/A 09/25/2013   Procedure: LAPAROSCOPIC CHOLECYSTECTOMY WITH INTRAOPERATIVE CHOLANGIOGRAM;  Surgeon: Pedro Earls, MD;  Location: [facility];   Service: General;  Laterality: N/A;  . WISDOM TOOTH EXTRACTION  2002    Family History: Family History  Problem Relation Age of Onset  . Hypertension Mother   . Diabetes Father   . Cancer Father        prostate  . Hypertension Other   . Cancer Maternal Grandmother   . COPD Maternal Grandmother   . Heart disease Maternal Grandmother     Social History: Social History   Tobacco Use  . Smoking status: Never Smoker  . Smokeless tobacco: Never Used  Substance Use Topics  . Alcohol use: Not Currently    Comment: rare  . Drug use: No    Allergies: No Known Allergies  Medications Prior to Admission  Medication Sig Dispense Refill Last Dose  . acetaminophen (TYLENOL) 500 MG tablet Take 1,000 mg by mouth 2 (two) times daily as needed for moderate pain or headache.     Marland Kitchen azelastine (ASTELIN) 0.1 % nasal spray Place 2 sprays into both nostrils 2 (two) times daily. Use in each nostril as directed (Patient taking differently: Place 1 spray into both nostrils 2 (two) times daily as needed for allergies. Use in each nostril as directed)  30 mL 2 Taking  . BREO ELLIPTA 100-25 MCG/INH AEPB inhale 1 PUFF into THE lungs ONCE A DAY. RINSE MOUTH WITH WATER AFTER EACH USE (Patient taking differently: Inhale 1 puff into the lungs daily as needed (shortness of breath). ) 60 each 0 Taking  . buPROPion (WELLBUTRIN XL) 150 MG 24 hr tablet TAKE 2 TABLETS BY MOUTH EVERY MORNING (Patient taking differently: Take 300 mg by mouth daily. ) 60 tablet 2 Taking  . cetirizine (ZYRTEC) 10 MG tablet Take 10 mg by mouth daily as needed for allergies.     . Cholecalciferol (VITAMIN D3) 125 MCG (5000 UT) CAPS Take 5,000 Units by mouth at bedtime.   04/26/2018 at Unknown time  . cromolyn (INTAL) 20 MG/2ML nebulizer solution Take 2 mLs (20 mg total) by nebulization 4 (four) times daily as needed for wheezing. (Patient not taking: Reported on 04/26/2018) 30 mL 1 Completed Course at Unknown time  . Doxylamine-Pyridoxine ER  (BONJESTA) 20-20 MG TBCR Take 1 tablet by mouth at bedtime.   04/26/2018 at Unknown time  . fluticasone (FLONASE) 50 MCG/ACT nasal spray USE 2 SPRAYS IN EACH NOSTRIL EVERY DAY (Patient taking differently: Place 1 spray into both nostrils daily as needed for allergies. ) 48 g 3 Taking  . Menthol, Topical Analgesic, (BENGAY EX) Apply 1 application topically daily as needed (restless legs).     . montelukast (SINGULAIR) 10 MG tablet TAKE 1 TABLET BY MOUTH EVERY DAY (Patient taking differently: Take 10 mg by mouth at bedtime as needed (allergies). ) 90 tablet 1 Taking  . omeprazole (PRILOSEC OTC) 20 MG tablet Take 20 mg by mouth at bedtime.   04/26/2018 at Unknown time  . ondansetron (ZOFRAN-ODT) 4 MG disintegrating tablet Take 4 mg by mouth every 8 (eight) hours as needed for nausea or vomiting.   Past Week at Unknown time  . prednisoLONE acetate (PRED FORTE) 1 % ophthalmic suspension Place 1 drop into both eyes 4 (four) times daily. (Patient not taking: Reported on 04/26/2018) 5 mL 0 Completed Course at Unknown time  . Prenat-FeFmCb-DSS-FA-DHA w/o A (CITRANATAL HARMONY) 27-1-260 MG CAPS Take 1 tablet by mouth at bedtime.   04/26/2018 at Unknown time  . Prenatal-DSS-FeCb-FeGl-FA (CITRANATAL BLOOM) 90-1 MG TABS Take 1 tablet by mouth at bedtime.   04/26/2018 at Unknown time  . PROAIR HFA 108 (90 Base) MCG/ACT inhaler INHALE 1-2 PUFFS EVERY 4 HOURS FOR WHEEZING OR SHORTNESS OF BREATH (Patient taking differently: Inhale 2 puffs into the lungs every 4 (four) hours as needed for wheezing or shortness of breath. ) 17 g 2 Taking  . promethazine-dextromethorphan (PROMETHAZINE-DM) 6.25-15 MG/5ML syrup Take 5 mLs by mouth 4 times daily as needed for cough. (Patient not taking: Reported on 04/26/2018) 240 mL 1 Completed Course at Unknown time  . valACYclovir (VALTREX) 1000 MG tablet take 2 tablets by mouth twice a day (Patient taking differently: Take 2 g by mouth 2 (two) times daily as needed (cold sores). ) 360 tablet 0 Taking     ROS: Pertinent findings in history of present illness.  Physical Exam  Blood pressure (!) 145/91, pulse (!) 105, temperature 98.8 F (37.1 C), temperature source Oral, resp. rate 20, weight 93.9 kg, last menstrual period 04/06/2017. CONSTITUTIONAL: Well-developed, well-nourished female in no acute distress.  HENT:  Normocephalic, atraumatic, External right and left ear normal. Oropharynx is clear and moist EYES: Conjunctivae and EOM are normal. No scleral icterus.  NECK: Normal range of motion, supple, no masses SKIN: Skin is warm and  dry. No rash noted. Not diaphoretic. No erythema. No pallor. [place]: Alert and oriented to person, place, and time. PSYCHIATRIC: Normal mood and affect. Normal behavior. Normal judgment and thought content. CARDIOVASCULAR: Normal heart rate noted, regular rhythm RESPIRATORY: Effort and breath sounds normal ABDOMEN: Soft, nontender, nondistended, gravid appropriate for gestational age MUSCULOSKELETAL: Normal range of motion. No edema and no tenderness. 2+ distal pulses.  FHT:  Baseline 135 , moderate variability, accelerations present, no decelerations Contractions: irregular   Labs: Results for orders placed or performed during the hospital encounter of 04/26/18 (from the past 24 hour(s))  Urinalysis, Routine w reflex microscopic     Status: Abnormal   Collection Time: 04/26/18  5:11 PM  Result Value Ref Range   Color, Urine STRAW (A) YELLOW   APPearance CLEAR CLEAR   Specific Gravity, Urine 1.005 1.005 - 1.030   pH 7.0 5.0 - 8.0   Glucose, UA NEGATIVE NEGATIVE mg/dL   Hgb urine dipstick NEGATIVE NEGATIVE   Bilirubin Urine NEGATIVE NEGATIVE   Ketones, ur NEGATIVE NEGATIVE mg/dL   Protein, ur NEGATIVE NEGATIVE mg/dL   Nitrite NEGATIVE NEGATIVE   Leukocytes, UA NEGATIVE NEGATIVE  Protein / creatinine ratio, urine     Status: Abnormal   Collection Time: 04/26/18  5:11 PM  Result Value Ref Range   Creatinine, Urine 40.00 mg/dL   Total  Protein, Urine 7 mg/dL   Protein Creatinine Ratio 0.18 (H) 0.00 - 0.15 mg/mg[Cre]  CBC     Status: Abnormal   Collection Time: 04/26/18  5:29 PM  Result Value Ref Range   WBC 12.7 (H) 4.0 - 10.5 K/uL   RBC 3.51 (L) 3.87 - 5.11 MIL/uL   Hemoglobin 10.7 (L) 12.0 - 15.0 g/dL   HCT 32.6 (L) 36.0 - 46.0 %   MCV 92.9 80.0 - 100.0 fL   MCH 30.5 26.0 - 34.0 pg   MCHC 32.8 30.0 - 36.0 g/dL   RDW 14.5 11.5 - 15.5 %   Platelets 201 150 - 400 K/uL   nRBC 0.0 0.0 - 0.2 %  Comprehensive metabolic panel     Status: Abnormal   Collection Time: 04/26/18  5:29 PM  Result Value Ref Range   Sodium 134 (L) 135 - 145 mmol/L   Potassium 3.3 (L) 3.5 - 5.1 mmol/L   Chloride 107 98 - 111 mmol/L   CO2 19 (L) 22 - 32 mmol/L   Glucose, Bld 116 (H) 70 - 99 mg/dL   BUN 8 6 - 20 mg/dL   Creatinine, Ser 0.55 0.44 - 1.00 mg/dL   Calcium 8.6 (L) 8.9 - 10.3 mg/dL   Total Protein 6.7 6.5 - 8.1 g/dL   Albumin 3.0 (L) 3.5 - 5.0 g/dL   AST 21 15 - 41 U/L   ALT 13 0 - 44 U/L   Alkaline Phosphatase 103 38 - 126 U/L   Total Bilirubin 0.3 0.3 - 1.2 mg/dL   GFR calc non Af Amer >60 >60 mL/min   GFR calc Af Amer >60 >60 mL/min   Anion gap 8 5 - 15    MAU Course: Normal labs   Assessment: 1. Previous cesarean delivery affecting pregnancy, antepartum   2. Breech presentation, single or unspecified fetus   3. Gestational hypertension, third trimester     Plan: Called Dr. Garwin Brothers - recommend delivery with GHTN and > 37 wks. For RCS--last ate 3-4 pm. Dr. Garwin Brothers will come see her to determine plan of care.  Donnamae Jude, MD 04/26/2018 6:50 PM

## 2018-04-26 NOTE — H&P (Signed)
Christie Le is a 38 y.o. female  @ 37 1/7 wk  weeks admitted due to elevated BP with nl labs. Hx notable for previous C/S. Last sonogram done 04/22/2018 showed 5lb 15 oz Breech. Pt was scheduled for repeat C/S 05/09/2018. Pt denies h/a, visual changes or epigastric pain OB History    Gravida  3   Para  1   Term  1   Preterm      AB  1   Living  2     SAB  1   TAB      Ectopic      Multiple  1   Live Births  2          Past Medical History:  Diagnosis Date  . Anemia    hx of  . Asthma    no problems since pregnancy  . Family history of anesthesia complication    "mom is sensitive- severe n/v"  . Fibrocystic breast   . Pneumonia    age 32  . PONV (postoperative nausea and vomiting)   . Pregnancy induced hypertension   . S/P laparoscopic cholecystectomy June 15 09/25/2013   Single small stone; chronic changes;  Small bile ducts   . Seasonal allergies   . Vitamin D deficiency    Past Surgical History:  Procedure Laterality Date  . CESAREAN SECTION  07/29/2009  . CHOLECYSTECTOMY N/A 09/25/2013   Procedure: LAPAROSCOPIC CHOLECYSTECTOMY WITH INTRAOPERATIVE CHOLANGIOGRAM;  Surgeon: Pedro Earls, MD;  Location: [facility];  Service: General;  Laterality: N/A;  . WISDOM TOOTH EXTRACTION  2002   Family History: family history includes COPD in her maternal grandmother; Cancer in her father and maternal grandmother; Diabetes in her father; Heart disease in her maternal grandmother; Hypertension in her mother and another family member. Social History:  reports that she has never smoked. She has never used smokeless tobacco. She reports previous alcohol use. She reports that she does not use drugs.     Maternal Diabetes: No Genetic Screening: Normal Maternal Ultrasounds/Referrals: Normal Fetal Ultrasounds or other Referrals:  None Maternal Substance Abuse:  No Significant Maternal Medications:  None Significant Maternal Lab Results:  Lab values include: Group B Strep  negative, Rh negative Other Comments:  previous C/S. gest HTN  Review of Systems  All other systems reviewed and are negative.  History   Blood pressure (!) 157/92, pulse (!) 115, temperature 98.8 F (37.1 C), temperature source Oral, resp. rate 20, weight 93.9 kg, last menstrual period 04/06/2017. Exam Physical Exam  Constitutional: She is oriented to person, place, and time. She appears well-developed and well-nourished.  HENT:  Head: Atraumatic.  Eyes: EOM are normal.  Neck: Neck supple.  Cardiovascular: Regular rhythm.  Respiratory: Breath sounds normal.  GI: Soft.  Genitourinary:    Genitourinary Comments: deferred   Neurological: She is alert and oriented to person, place, and time.  Skin: Skin is warm and dry.  Psychiatric: She has a normal mood and affect.    Prenatal labs: ABO, Rh: O/Negative/-- (07/05 0000) Antibody: Negative (07/05 0000) Rubella: Immune (07/05 0000) RPR: Nonreactive (07/05 0000)  HBsAg: Negative (07/05 0000)  HIV: Non-reactive (07/05 0000)  GBS:   negative  CBC    Component Value Date/Time   WBC 12.7 (H) 04/26/2018 1729   RBC 3.51 (L) 04/26/2018 1729   HGB 10.7 (L) 04/26/2018 1729   HCT 32.6 (L) 04/26/2018 1729   PLT 201 04/26/2018 1729   MCV 92.9 04/26/2018 1729   MCH  30.5 04/26/2018 1729   MCHC 32.8 04/26/2018 1729   RDW 14.5 04/26/2018 1729   LYMPHSABS 1,916 01/24/2018 1044   MONOABS 380 07/02/2016 0941   EOSABS 71 01/24/2018 1044   BASOSABS 36 01/24/2018 1044   CMP Latest Ref Rng & Units 04/26/2018 03/19/2018 07/09/2017  Glucose 70 - 99 mg/dL 116(H) 87 88  BUN 6 - 20 mg/dL 8 6 10   Creatinine 0.44 - 1.00 mg/dL 0.55 0.43(L) 0.76  Sodium 135 - 145 mmol/L 134(L) 137 138  Potassium 3.5 - 5.1 mmol/L 3.3(L) 3.7 4.0  Chloride 98 - 111 mmol/L 107 108 106  CO2 22 - 32 mmol/L 19(L) 21(L) 24  Calcium 8.9 - 10.3 mg/dL 8.6(L) 8.8(L) 9.3  Total Protein 6.5 - 8.1 g/dL 6.7 6.7 7.4  Total Bilirubin 0.3 - 1.2 mg/dL 0.3 0.2(L) 0.4  Alkaline Phos  38 - 126 U/L 103 87 -  AST 15 - 41 U/L 21 19 25   ALT 0 - 44 U/L 13 12 16    Protein/creatinine ratio 0.18  Assessment/Plan: Gestational HTN Previous C/S Breech presentation IUP@ 37 1/7 weeks P) repeat C/S. Procedure explained. Risk of surgery reviewed including infection, bleeding, poss need for blood transfusion and its risk,( HIV, acute rxn, hepatitis), injury to surrounding organ structures, internal scar tissue. All ? answered  Christie Le A Ottavio Norem 04/26/2018, 7:12 PM

## 2018-04-26 NOTE — MAU Note (Signed)
Pt C/O feeling dizzy & lightheaded, works at a pharmacy - took BP 178/104, then 166/93.  Denies HA or visual changes, no epigastric pain.  Called MD office, told to come in.  Decreased fetal movement today.  Has braxton hicks uc's, denies bleeding or LOF.

## 2018-04-27 ENCOUNTER — Encounter (HOSPITAL_COMMUNITY): Payer: Self-pay | Admitting: *Deleted

## 2018-04-27 DIAGNOSIS — O99344 Other mental disorders complicating childbirth: Secondary | ICD-10-CM | POA: Diagnosis present

## 2018-04-27 DIAGNOSIS — O134 Gestational [pregnancy-induced] hypertension without significant proteinuria, complicating childbirth: Secondary | ICD-10-CM | POA: Diagnosis present

## 2018-04-27 DIAGNOSIS — J45909 Unspecified asthma, uncomplicated: Secondary | ICD-10-CM | POA: Diagnosis present

## 2018-04-27 DIAGNOSIS — E876 Hypokalemia: Secondary | ICD-10-CM | POA: Diagnosis present

## 2018-04-27 DIAGNOSIS — O26893 Other specified pregnancy related conditions, third trimester: Secondary | ICD-10-CM | POA: Diagnosis present

## 2018-04-27 DIAGNOSIS — O321XX0 Maternal care for breech presentation, not applicable or unspecified: Secondary | ICD-10-CM | POA: Diagnosis present

## 2018-04-27 DIAGNOSIS — Z3A37 37 weeks gestation of pregnancy: Secondary | ICD-10-CM | POA: Diagnosis not present

## 2018-04-27 DIAGNOSIS — O34211 Maternal care for low transverse scar from previous cesarean delivery: Secondary | ICD-10-CM | POA: Diagnosis present

## 2018-04-27 DIAGNOSIS — O9902 Anemia complicating childbirth: Secondary | ICD-10-CM | POA: Diagnosis present

## 2018-04-27 DIAGNOSIS — R030 Elevated blood-pressure reading, without diagnosis of hypertension: Secondary | ICD-10-CM | POA: Diagnosis present

## 2018-04-27 DIAGNOSIS — Z6791 Unspecified blood type, Rh negative: Secondary | ICD-10-CM | POA: Diagnosis not present

## 2018-04-27 DIAGNOSIS — F419 Anxiety disorder, unspecified: Secondary | ICD-10-CM | POA: Diagnosis present

## 2018-04-27 DIAGNOSIS — O9952 Diseases of the respiratory system complicating childbirth: Secondary | ICD-10-CM | POA: Diagnosis present

## 2018-04-27 LAB — COMPREHENSIVE METABOLIC PANEL
ALT: 13 U/L (ref 0–44)
AST: 21 U/L (ref 15–41)
Albumin: 3 g/dL — ABNORMAL LOW (ref 3.5–5.0)
Alkaline Phosphatase: 112 U/L (ref 38–126)
Anion gap: 8 (ref 5–15)
BUN: 7 mg/dL (ref 6–20)
CO2: 22 mmol/L (ref 22–32)
Calcium: 8.9 mg/dL (ref 8.9–10.3)
Chloride: 106 mmol/L (ref 98–111)
Creatinine, Ser: 0.57 mg/dL (ref 0.44–1.00)
GFR calc Af Amer: >60 mL/min (ref >60)
GFR calc non Af Amer: >60 mL/min (ref >60)
Glucose, Bld: 99 mg/dL (ref 70–99)
Potassium: 3.7 mmol/L (ref 3.5–5.1)
SODIUM: 136 mmol/L (ref 135–145)
Total Bilirubin: 0.4 mg/dL (ref 0.3–1.2)
Total Protein: 6.6 g/dL (ref 6.5–8.1)

## 2018-04-27 LAB — RPR: RPR: NONREACTIVE

## 2018-04-27 MED ORDER — FENTANYL CITRATE (PF) 100 MCG/2ML IJ SOLN: 25.0000 ug | INTRAMUSCULAR | Status: DC | PRN

## 2018-04-27 MED ORDER — NALOXONE HCL 4 MG/10ML IJ SOLN
1.0000 ug/kg/h | INTRAVENOUS | Status: DC | PRN
  Filled 2018-04-27: qty 5

## 2018-04-27 MED ORDER — OXYCODONE HCL 5 MG PO TABS: 5.0000 mg | ORAL_TABLET | ORAL | Status: DC | PRN

## 2018-04-27 MED ORDER — LABETALOL HCL 100 MG PO TABS
100.0000 mg | ORAL_TABLET | Freq: Two times a day (BID) | ORAL | Status: DC
  Administered 2018-04-27 – 2018-04-30 (×7): 100 mg via ORAL
  Filled 2018-04-27 (×8): qty 1

## 2018-04-27 MED ORDER — KETOROLAC TROMETHAMINE 30 MG/ML IJ SOLN
INTRAMUSCULAR | Status: AC
  Filled 2018-04-27: qty 1

## 2018-04-27 MED ORDER — LACTATED RINGERS IV SOLN
INTRAVENOUS | Status: DC | PRN
  Administered 2018-04-27: via INTRAVENOUS

## 2018-04-27 MED ORDER — PRENATAL MULTIVITAMIN CH
1.0000 | ORAL_TABLET | Freq: Every day | ORAL | Status: DC
  Administered 2018-04-27 – 2018-04-30 (×4): 1 via ORAL
  Filled 2018-04-27 (×4): qty 1

## 2018-04-27 MED ORDER — ONDANSETRON HCL 4 MG/2ML IJ SOLN: 4.0000 mg | Freq: Three times a day (TID) | INTRAMUSCULAR | Status: DC | PRN

## 2018-04-27 MED ORDER — DIPHENHYDRAMINE HCL 25 MG PO CAPS
25.0000 mg | ORAL_CAPSULE | Freq: Four times a day (QID) | ORAL | Status: DC | PRN
  Administered 2018-04-27: 25 mg via ORAL
  Filled 2018-04-27: qty 1

## 2018-04-27 MED ORDER — WITCH HAZEL-GLYCERIN EX PADS: 1.0000 "application " | MEDICATED_PAD | CUTANEOUS | Status: DC | PRN

## 2018-04-27 MED ORDER — SIMETHICONE 80 MG PO CHEW: 80.0000 mg | CHEWABLE_TABLET | ORAL | Status: DC | PRN

## 2018-04-27 MED ORDER — SODIUM CHLORIDE 0.9 % IR SOLN
Status: DC | PRN
  Administered 2018-04-27: 1

## 2018-04-27 MED ORDER — MENTHOL 3 MG MT LOZG: 1.0000 | LOZENGE | OROMUCOSAL | Status: DC | PRN

## 2018-04-27 MED ORDER — KETOROLAC TROMETHAMINE 30 MG/ML IJ SOLN
30.0000 mg | Freq: Four times a day (QID) | INTRAMUSCULAR | Status: AC | PRN
  Administered 2018-04-27: 30 mg via INTRAMUSCULAR

## 2018-04-27 MED ORDER — POTASSIUM CHLORIDE CRYS ER 20 MEQ PO TBCR
40.0000 meq | EXTENDED_RELEASE_TABLET | Freq: Two times a day (BID) | ORAL | Status: AC
  Administered 2018-04-27 – 2018-04-28 (×3): 40 meq via ORAL
  Filled 2018-04-27 (×3): qty 2

## 2018-04-27 MED ORDER — COCONUT OIL OIL
1.0000 "application " | TOPICAL_OIL | Status: DC | PRN
  Filled 2018-04-27: qty 120

## 2018-04-27 MED ORDER — BUPIVACAINE HCL (PF) 0.25 % IJ SOLN
INTRAMUSCULAR | Status: AC
  Filled 2018-04-27: qty 20

## 2018-04-27 MED ORDER — ZOLPIDEM TARTRATE 5 MG PO TABS: 5.0000 mg | ORAL_TABLET | Freq: Every evening | ORAL | Status: DC | PRN

## 2018-04-27 MED ORDER — FLUTICASONE PROPIONATE 50 MCG/ACT NA SUSP
1.0000 | Freq: Every day | NASAL | Status: DC | PRN
  Administered 2018-04-27 – 2018-04-29 (×3): 1 via NASAL
  Filled 2018-04-27: qty 16

## 2018-04-27 MED ORDER — SIMETHICONE 80 MG PO CHEW
80.0000 mg | CHEWABLE_TABLET | ORAL | Status: DC
  Administered 2018-04-27 – 2018-04-29 (×3): 80 mg via ORAL
  Filled 2018-04-27 (×3): qty 1

## 2018-04-27 MED ORDER — MONTELUKAST SODIUM 10 MG PO TABS
10.0000 mg | ORAL_TABLET | Freq: Every evening | ORAL | Status: DC | PRN
  Filled 2018-04-27: qty 1

## 2018-04-27 MED ORDER — OXYTOCIN 40 UNITS IN NORMAL SALINE INFUSION - SIMPLE MED: 2.5000 [IU]/h | INTRAVENOUS | Status: AC

## 2018-04-27 MED ORDER — DIBUCAINE 1 % RE OINT: 1.0000 "application " | TOPICAL_OINTMENT | RECTAL | Status: DC | PRN

## 2018-04-27 MED ORDER — LACTATED RINGERS IV SOLN
INTRAVENOUS | Status: DC
  Administered 2018-04-27: 05:00:00 via INTRAVENOUS

## 2018-04-27 MED ORDER — KETOROLAC TROMETHAMINE 30 MG/ML IJ SOLN: 30.0000 mg | Freq: Four times a day (QID) | INTRAMUSCULAR | Status: AC | PRN

## 2018-04-27 MED ORDER — DIPHENHYDRAMINE HCL 25 MG PO CAPS
25.0000 mg | ORAL_CAPSULE | ORAL | Status: DC | PRN
  Filled 2018-04-27: qty 1

## 2018-04-27 MED ORDER — NALBUPHINE HCL 10 MG/ML IJ SOLN: 5.0000 mg | Freq: Once | INTRAMUSCULAR | Status: DC | PRN

## 2018-04-27 MED ORDER — AZELASTINE HCL 0.1 % NA SOLN
2.0000 | Freq: Two times a day (BID) | NASAL | Status: DC
  Administered 2018-04-29 – 2018-04-30 (×3): 2 via NASAL
  Filled 2018-04-27: qty 30

## 2018-04-27 MED ORDER — NALBUPHINE HCL 10 MG/ML IJ SOLN: 5.0000 mg | INTRAMUSCULAR | Status: DC | PRN

## 2018-04-27 MED ORDER — DIPHENHYDRAMINE HCL 50 MG/ML IJ SOLN: 12.5000 mg | INTRAMUSCULAR | Status: DC | PRN

## 2018-04-27 MED ORDER — OXYTOCIN 10 UNIT/ML IJ SOLN
INTRAVENOUS | Status: DC | PRN
  Administered 2018-04-27: 40 [IU] via INTRAVENOUS

## 2018-04-27 MED ORDER — BUPIVACAINE HCL 0.25 % IJ SOLN
INTRAMUSCULAR | Status: DC | PRN
  Administered 2018-04-27: 30 mL

## 2018-04-27 MED ORDER — ACETAMINOPHEN 500 MG PO TABS
1000.0000 mg | ORAL_TABLET | Freq: Four times a day (QID) | ORAL | Status: AC
  Administered 2018-04-27 (×3): 1000 mg via ORAL
  Filled 2018-04-27 (×3): qty 2

## 2018-04-27 MED ORDER — NALOXONE HCL 0.4 MG/ML IJ SOLN: 0.4000 mg | INTRAMUSCULAR | Status: DC | PRN

## 2018-04-27 MED ORDER — BUPROPION HCL ER (XL) 300 MG PO TB24
300.0000 mg | ORAL_TABLET | Freq: Every day | ORAL | Status: DC
  Filled 2018-04-27 (×4): qty 1

## 2018-04-27 MED ORDER — DEXAMETHASONE SODIUM PHOSPHATE 10 MG/ML IJ SOLN
INTRAMUSCULAR | Status: DC | PRN
  Administered 2018-04-27: 4 mg via INTRAVENOUS

## 2018-04-27 MED ORDER — SENNOSIDES-DOCUSATE SODIUM 8.6-50 MG PO TABS
2.0000 | ORAL_TABLET | ORAL | Status: DC
  Administered 2018-04-27 – 2018-04-29 (×3): 2 via ORAL
  Filled 2018-04-27 (×3): qty 2

## 2018-04-27 MED ORDER — SIMETHICONE 80 MG PO CHEW
80.0000 mg | CHEWABLE_TABLET | Freq: Three times a day (TID) | ORAL | Status: DC
  Administered 2018-04-27 – 2018-04-30 (×10): 80 mg via ORAL
  Filled 2018-04-27 (×10): qty 1

## 2018-04-27 MED ORDER — SODIUM CHLORIDE 0.9% FLUSH: 3.0000 mL | INTRAVENOUS | Status: DC | PRN

## 2018-04-27 MED ORDER — KETOROLAC TROMETHAMINE 30 MG/ML IJ SOLN: 30.0000 mg | Freq: Once | INTRAMUSCULAR | Status: DC | PRN

## 2018-04-27 MED ORDER — PROMETHAZINE HCL 25 MG/ML IJ SOLN: 6.2500 mg | INTRAMUSCULAR | Status: DC | PRN

## 2018-04-27 MED ORDER — PROMETHAZINE HCL 25 MG/ML IJ SOLN
12.5000 mg | Freq: Once | INTRAMUSCULAR | Status: AC
  Administered 2018-04-27: 12.5 mg via INTRAVENOUS
  Filled 2018-04-27: qty 1

## 2018-04-27 MED ORDER — ALBUTEROL SULFATE (2.5 MG/3ML) 0.083% IN NEBU: 3.0000 mL | INHALATION_SOLUTION | RESPIRATORY_TRACT | Status: DC | PRN

## 2018-04-27 MED ORDER — IBUPROFEN 800 MG PO TABS
800.0000 mg | ORAL_TABLET | Freq: Three times a day (TID) | ORAL | Status: AC
  Administered 2018-04-27 – 2018-04-29 (×9): 800 mg via ORAL
  Filled 2018-04-27 (×9): qty 1

## 2018-04-27 MED ORDER — RHO D IMMUNE GLOBULIN 1500 UNIT/2ML IJ SOSY
300.0000 ug | PREFILLED_SYRINGE | Freq: Once | INTRAMUSCULAR | Status: AC
  Administered 2018-04-27: 300 ug via INTRAVENOUS
  Filled 2018-04-27: qty 2

## 2018-04-27 NOTE — Addendum Note (Signed)
Addendum  created 04/27/18 1400 by Bufford Spikes, CRNA   Clinical Note Signed

## 2018-04-27 NOTE — Lactation Note (Signed)
This note was copied from a baby's chart. Lactation Consultation Note  Patient Name: Christie Le [id number] Date: 04/27/2018 Reason for consult: Initial assessment;Early term 62-38.6wks   Baby 70 hours old.  Mother bf twins for 6 mos.  Reviewed hand expression w/ drops expressed bilaterally. Assisted w/ latching baby in cross cradle hold.  Sucks and swallows observed. Feed on demand approximately 8-12 times per day.   Discussed LPI feeding plan to make parents aware if baby becomes sleepy. Mother has DEBP set up.   Recommend mother post pump 4-6 times per day for 10-20 min with DEBP on initiation setting. Give baby back volume pumped at the next feeding. Reviewed cleaning and milk storage.  Mom made aware of O/P services, breastfeeding support groups, community resources, and our phone # for post-discharge questions.      Maternal Data Has patient been taught Hand Expression?: Yes  Feeding Feeding Type: Breast Fed  LATCH Score Latch: Grasps breast easily, tongue down, lips flanged, rhythmical sucking.  Audible Swallowing: A few with stimulation  Type of Nipple: Everted at rest and after stimulation  Comfort (Breast/Nipple): Soft / non-tender  Hold (Positioning): Assistance needed to correctly position infant at breast and maintain latch.  LATCH Score: 8  Interventions Interventions: Breast feeding basics reviewed;Assisted with latch;Skin to skin;Hand express;Adjust position;Support pillows;Breast compression;DEBP  Lactation Tools Discussed/Used Pump Review: Setup, frequency, and cleaning;Milk Storage Initiated by:: RN Date initiated:: 04/27/18   Consult Status Consult Status: Follow-up Date: 04/28/18 Follow-up type: In-patient    Vivianne Master [facility] 04/27/2018, 2:01 PM

## 2018-04-27 NOTE — Anesthesia Postprocedure Evaluation (Signed)
Anesthesia Post Note  Patient: Christie Le  Procedure(s) Performed: Repeat CESAREAN SECTION (N/A )     Patient location during evaluation: Mother Baby Anesthesia Type: Spinal Level of consciousness: awake, awake and alert and oriented Pain management: pain level controlled Vital Signs Assessment: post-procedure vital signs reviewed and stable Respiratory status: spontaneous breathing and respiratory function stable Cardiovascular status: blood pressure returned to baseline Postop Assessment: no headache, no backache, spinal receding, patient able to bend at knees, no apparent nausea or vomiting, adequate PO intake and able to ambulate Anesthetic complications: no    Last Vitals:  Vitals:   04/27/18 0530 04/27/18 1012  BP: (!) 148/99   Pulse: 74   Resp: 16 16  Temp: 37.1 C 36.9 C  SpO2: 97% 97%    Last Pain:  Vitals:   04/27/18 1012  TempSrc: Oral  PainSc: 0-No pain   Pain Goal: Patients Stated Pain Goal: 0 (04/26/18 1703)               Bufford Spikes

## 2018-04-27 NOTE — Progress Notes (Signed)
Notified Lars Pinks, CNM of HTN and nausea/vomiting. New medication order received. Parameters to notify CNM if SBP>160 or DBP>110.

## 2018-04-27 NOTE — Anesthesia Postprocedure Evaluation (Signed)
Anesthesia Post Note  Patient: Christie Le  Procedure(s) Performed: Repeat CESAREAN SECTION (N/A )     Patient location during evaluation: PACU Anesthesia Type: Spinal Level of consciousness: awake and alert Pain management: pain level controlled Vital Signs Assessment: post-procedure vital signs reviewed and stable Respiratory status: spontaneous breathing, nonlabored ventilation and respiratory function stable Cardiovascular status: blood pressure returned to baseline and stable Postop Assessment: no apparent nausea or vomiting and spinal receding Anesthetic complications: no    Last Vitals:  Vitals:   04/27/18 0115 04/27/18 0130  BP: (!) 147/85 (!) 149/96  Pulse: 98 100  Resp: 13 20  Temp:    SpO2: 97% 97%    Last Pain:  Vitals:   04/27/18 0109  TempSrc:   PainSc: 5    Pain Goal: Patients Stated Pain Goal: 0 (04/26/18 1703)               Brennan Bailey

## 2018-04-27 NOTE — Brief Op Note (Signed)
04/26/2018 - 04/27/2018  12:49 AM  PATIENT:  Christie Le  38 y.o. female  PRE-OPERATIVE DIAGNOSIS:  Previous Cesarean Section; Repeat Cesarean, Complete Breech presentation; gestational hypertension  POST-OPERATIVE DIAGNOSIS:  Previous Cesarean Section; Repeat Cesarean, Complete Breech presentation; gestational hypertension  PROCEDURE:  Repeat Cesarean section, kerr hysterotomy  SURGEON:  Surgeon(s) and Role:    * Servando Salina, MD - Primary  PHYSICIAN ASSISTANT:   ASSISTANTS: Lars Pinks, CNM   ANESTHESIA:   spinal FINDINGS; live female, complete breech, nl tubes and ovaries, CAN x 1, ant placenta EBL:  650 mL   BLOOD ADMINISTERED:none  DRAINS: none   LOCAL MEDICATIONS USED:  MARCAINE     SPECIMEN:  No Specimen  DISPOSITION OF SPECIMEN:  N/A  COUNTS:  YES  TOURNIQUET:  * No tourniquets in log *  DICTATION: .Other Dictation: Dictation Number [id number]  PLAN OF CARE: Admit to inpatient   PATIENT DISPOSITION:  PACU - hemodynamically stable.   Delay start of Pharmacological VTE agent (>24hrs) due to surgical blood loss or risk of bleeding: no

## 2018-04-27 NOTE — Op Note (Signed)
Christie Le, S[facility]IGERT MEDICAL RECORD [id number] ACCOUNT [id number] DATE OF BIRTH:Jan 12, 1981 FACILITY: [facility] LOCATION: [id number] PHYSICIAN:Nevan Creighton A. Prithvi Kooi, MD  OPERATIVE REPORT  DATE OF PROCEDURE:  04/27/2018  PREOPERATIVE DIAGNOSIS:  Gestational hypertension, previous cesarean section, breech presentation, intrauterine gestation at 57 and 2/7 weeks.  PROCEDURE:  Repeat cesarean section, Kerr hysterotomy.  POSTOPERATIVE DIAGNOSIS:  Gestational hypertension, previous cesarean section, complete breech presentation, intrauterine gestation at 6 and 2/7 weeks.  ANESTHESIA:  Spinal.  SURGEON:  Servando Salina, MD  ASSISTANT:  Lars Pinks, CNM.  DESCRIPTION OF PROCEDURE:  Under adequate spinal anesthesia, the patient was placed in the supine position with a left lateral tilt.  She was sterilely prepped and draped in the usual fashion.  Indwelling Foley catheter was sterilely placed.  Marcaine 0.25% was injected along the previous Pfannenstiel skin incision site.  Pfannenstiel skin incision was then made, carried down to the rectus fascia.  The rectus fascia was opened transversely.  The rectus fascia was then bluntly and sharply dissected off  the rectus muscle in a superior and inferior fashion.  The rectus muscle was split in the midline.  The parietal peritoneum was entered sharply and extended.  A lower uterine segment vesicouterine adhesion was noted.  A self-retaining Alexis retractor was placed.  The bladder was carefully dissected off the lower uterine segment and displaced inferiorly with additional use of the bladder blade.  Curvilinear low transverse uterine incision was then made and extended in a cephalic and caudad fashion bluntly.  Intact membrane was noted.  Through that amniotic sac the feet was identified, brought in artificial rupture of membranes then occurred.  Subsequent delivery of a live female with nuchal cord x1 using the usual breech maneuver.  The baby had   delayed cord clamp x1 minute.  Cord was subsequently clamped, cut.  The baby was transferred to the awaiting pediatricians who assigned Apgars of 9 and 9 at one and five minutes.  Placenta was anterior, was manually removed with a focal area that was suspicious for possible early accreta, but the additional pieces were able to be removed and the uterine cavity was cleaned of debris.  Uterine incision had no extension, was closed in 2 layers, the first layer 0 Monocryl running lock stitch, second layer imbricating using 0 Monocryl suture.  Normal tubes and ovaries were noted bilaterally.  The abdomen was irrigated and suctioned of debris.  Interceed was placed in an inverted T fashion.  The Alexis retractor was then removed.  The parietal  peritoneum was closed with 2-0 Vicryl.  The rectus fascia was closed with 0 Vicryl x2.  The subcutaneous area was irrigated, small bleeders cauterized.  Interrupted 2-0 plain sutures placed and the skin approximated using 4-0 Vicryl subcuticular closure. Steri-Strips and benzoin was placed.  SPECIMEN:  Placenta not sent to pathology.  ESTIMATED BLOOD LOSS: 951 mL  URINE OUTPUT:  400 mL  INTRAOPERATIVE FLUIDS:  1800 mL  COUNTS:  Sponge and instrument counts x2 was correct.  COMPLICATIONS:  None.  The patient tolerated the procedure well and was transferred to recovery room in stable condition.  TN/NUANCE  D:04/27/2018 T:04/27/2018 JOB:[id number]

## 2018-04-27 NOTE — Transfer of Care (Signed)
Immediate Anesthesia Transfer of Care Note  Patient: Christie Le  Procedure(s) Performed: Repeat CESAREAN SECTION (N/A )  Patient Location: PACU  Anesthesia Type:Spinal  Level of Consciousness: awake, alert  and oriented  Airway & Oxygen Therapy: Patient Spontanous Breathing  Post-op Assessment: Report given to RN and Post -op Vital signs reviewed and stable  Post vital signs: Reviewed and stable  Last Vitals:  Vitals Value Taken Time  BP 144/95 04/27/2018  1:07 AM  Temp    Pulse 109 04/27/2018  1:09 AM  Resp 21 04/27/2018  1:09 AM  SpO2 98 % 04/27/2018  1:09 AM  Vitals shown include unvalidated device data.  Last Pain:  Vitals:   04/26/18 1703  TempSrc:   PainSc: 3       Patients Stated Pain Goal: 0 (94/94/47 3958)  Complications: No apparent anesthesia complications

## 2018-04-27 NOTE — Anesthesia Procedure Notes (Signed)
Spinal  Patient location during procedure: OR Start time: 04/26/2018 11:40 PM End time: 04/26/2018 11:42 PM Staffing Anesthesiologist: Brennan Bailey, MD Performed: anesthesiologist  Preanesthetic Checklist Completed: patient identified, site marked, pre-op evaluation, timeout performed, IV checked, risks and benefits discussed and monitors and equipment checked Spinal Block Patient position: sitting Prep: ChloraPrep Patient monitoring: heart rate, cardiac monitor and continuous pulse ox Approach: midline Location: L3-4 Injection technique: single-shot Needle Needle type: Pencan  Needle gauge: 24 G Needle length: 10 cm Assessment Sensory level: T4 Additional Notes Risks, benefits, and alternative discussed. Patient gave consent to procedure. Prepped and draped in sitting position. Clear CSF obtained after one needle pass. Positive terminal aspiration. No pain or paraesthesias with injection. Patient tolerated procedure well. Vital signs stable. Tawny Asal, MD

## 2018-04-27 NOTE — Progress Notes (Addendum)
POSTOPERATIVE DAY # 0 S/P Repeat LTCS for breech and previous LTCS, baby girl, "Noah Delaine"   S:         Reports feeling sore, but overall okay  Denies HA, visual changes, RUQ/epigastric pain              Tolerating po intake / + nausea and vomiting overnight, but improved with 1 dose of Phenergan - states she has eaten since and has done well / no flatus / no BM  Denies dizziness, SOB, or CP             Bleeding is moderate             Pain controlled with Motrin and Tylenol             Foley cath in place, about to get up with orthostatic vital signs  Newborn breast feeding and pumping colostrum   Emotionally states she is doing well - has anxiety and likes to be in control of situations, so unexpected c/s caused increase in anxiety, but doing fine now   C/o mild itching    O:  VS: BP on arrival to room 112/78 - not filed yet  BP (!) 148/99 (BP Location: Right Arm)   Pulse 74   Temp 98.8 F (37.1 C) (Oral)   Resp 16   Ht 5\' 6"  (1.676 m)   Wt 93.9 kg   LMP 04/06/2017   SpO2 97%   Breastfeeding Unknown   BMI 33.41 kg/m  04/27/18 0530  98.8 F (37.1 C)  74  -  16  148/99Abnormal   Lying  97 %  -  - EL   04/27/18 0430  98.4 F (36.9 C)  76  -  18  140/83  Semi-fowlers  97 %  -  - EL   04/27/18 0330  98 F (36.7 C)  73  -  18  137/92Abnormal   Semi-fowlers  97 %  -  - EL   04/27/18 0230  98.4 F (36.9 C)  102Abnormal   -  18  156/97Abnormal   Semi-fowlers  97 %  -  - EL   04/27/18 0215  98.2 F (36.8 C)  94  95  17  141/84Abnormal   Lying  98 %  -  - CW   04/27/18 0200  -  97  98  18  148/93Abnormal   Lying  98 %  -  - CW   04/27/18 0145  98.3 F (36.8 C)  91  92  17  151/89Abnormal   Lying             LABS:               Recent Labs    04/26/18 1729  WBC 12.7*  HGB 10.7*  PLT 201               Bloodtype: --/--/O NEG (01/07 1944)  Rubella: Immune (07/05 0000)                                             I&O: Intake/Output      01/07 0701 - 01/08 0700 01/08 0701 -  01/09 0700   I.V. (mL/kg) 1900 (20.2)    Total Intake(mL/kg) 1900 (20.2)    Urine (mL/kg/hr) 650    Emesis/NG output 0    Blood  951    Total Output 1601    Net +299         Emesis Occurrence 1 x                 Physical Exam:             Alert and Oriented X3  Lungs: Clear and unlabored  Heart: regular rate and rhythm / no murmurs  Abdomen: soft, non-tender, non-distended, active bowel sounds in all quadrants              Fundus: firm, non-tender, U-1             Dressing: honeycomb dsg with Dermabond c/d/i              Incision:  approximated with sutures / no erythema / no ecchymosis / no drainage  Perineum: intact; foley catheter indwelling - draining amber urine  Lochia: small, no clots   Extremities: trace BLE edema, no calf pain or tenderness, SCDs in place  A/P:     POD # 0 S/P Repeat LTCS            Gestational Hypertension   - Normal BP this morning, although other BPs have been labile - continue to monitor   - Continue Labetalol 100mg  BID    - No neural s/s, no evidence of PEC   - Repeat CBC and CMP in AM   RH negative, Baby RH Positive    - Rhogam today  Anxiety    - Stable on Wellbutrin 300mg  XL daily    Hypokalemia on admission   - K-Dur 23mEq PO BID   Routine postoperative care              See lactation today  D/C foley when urine clears - still on IVFs   Continue current care   Offer flu vaccine prior to discharge  Lars Pinks, MSN, CNM [facility]

## 2018-04-28 ENCOUNTER — Other Ambulatory Visit: Payer: Self-pay

## 2018-04-28 LAB — COMPREHENSIVE METABOLIC PANEL
ALT: 14 U/L (ref 0–44)
AST: 23 U/L (ref 15–41)
Albumin: 2.6 g/dL — ABNORMAL LOW (ref 3.5–5.0)
Alkaline Phosphatase: 86 U/L (ref 38–126)
Anion gap: 7 (ref 5–15)
BUN: 8 mg/dL (ref 6–20)
CHLORIDE: 107 mmol/L (ref 98–111)
CO2: 22 mmol/L (ref 22–32)
CREATININE: 0.51 mg/dL (ref 0.44–1.00)
Calcium: 8.8 mg/dL — ABNORMAL LOW (ref 8.9–10.3)
GFR calc Af Amer: >60 mL/min (ref >60)
GFR calc non Af Amer: >60 mL/min (ref >60)
Glucose, Bld: 85 mg/dL (ref 70–99)
Potassium: 4 mmol/L (ref 3.5–5.1)
Sodium: 136 mmol/L (ref 135–145)
Total Bilirubin: 0.4 mg/dL (ref 0.3–1.2)
Total Protein: 6.2 g/dL — ABNORMAL LOW (ref 6.5–8.1)

## 2018-04-28 LAB — RH IG WORKUP (INCLUDES ABO/RH)
ABO/RH(D): O NEG
Fetal Screen: NEGATIVE
Gestational Age(Wks): 37
Unit division: 0

## 2018-04-28 LAB — CBC
HCT: 29.6 % — ABNORMAL LOW (ref 36.0–46.0)
Hemoglobin: 9.5 g/dL — ABNORMAL LOW (ref 12.0–15.0)
MCH: 30.3 pg (ref 26.0–34.0)
MCHC: 32.1 g/dL (ref 30.0–36.0)
MCV: 94.3 fL (ref 80.0–100.0)
Platelets: 177 10*3/uL (ref 150–400)
RBC: 3.14 MIL/uL — ABNORMAL LOW (ref 3.87–5.11)
RDW: 15 % (ref 11.5–15.5)
WBC: 11.5 10*3/uL — ABNORMAL HIGH (ref 4.0–10.5)
nRBC: 0 % (ref 0.0–0.2)

## 2018-04-28 LAB — BIRTH TISSUE RECOVERY COLLECTION (PLACENTA DONATION)

## 2018-04-28 NOTE — Progress Notes (Signed)
No c/o; denies h/a, vision changes, ruq pain tol po, ambulating; +BM/flatus; voiding w/o difficulty  Temp:  [98.1 F (36.7 C)-98.9 F (37.2 C)] 98.2 F (36.8 C) (01/09 0620) Pulse Rate:  [85-97] 85 (01/09 0620) Resp:  [16-18] 18 (01/09 0620) BP: (124-133)/(56-83) 124/83 (01/09 0620) SpO2:  [97 %-99 %] 99 % (01/09 0620)  Intake/Output Summary (Last 24 hours) at 04/28/2018 0942 Last data filed at 04/27/2018 1930 Gross per 24 hour  Intake -  Output 1650 ml  Net -1650 ml     A&ox3 rrr ctab Abd: soft, nt, nd; fundus firm and 2cm below umb Dressing: d/c/i; honeycomb dressing LE: tr edema, nt bilat  CBC Latest Ref Rng & Units 04/28/2018 04/26/2018 03/19/2018  WBC 4.0 - 10.5 K/uL 11.5(H) 12.7(H) 11.2(H)  Hemoglobin 12.0 - 15.0 g/dL 9.5(L) 10.7(L) 10.2(L)  Hematocrit 36.0 - 46.0 % 29.6(L) 32.6(L) 31.1(L)  Platelets 150 - 400 K/uL 177 201 252    A/P: pod 1 s/p rltcs, breech 1. Doing well, contin current care 2. Chronic anemia with acute change- asymptomatic, plan iron q day after d/c 3. ghtn - stable on labetalol 100mg  po bid; contin to monitor

## 2018-04-28 NOTE — Lactation Note (Signed)
This note was copied from a baby's chart. Lactation Consultation Note  Patient Name: Christie Le [id number]'M Date: 04/28/2018 Reason for consult: Follow-up assessment;Early term 10-38.6wks Mom reports that it is still difficult to wake baby at times.  When she does latch she stays active.  Mom is pumping every 3 hours and obtaining small amounts of colostrum.  Reviewed importance of skin to skin and waking techniques.  Instructed to use good breast compression and massage during feeding.  Encouraged to call for concerns/assist prn.  Maternal Data    Feeding    LATCH Score                   Interventions    Lactation Tools Discussed/Used     Consult Status Consult Status: Follow-up Date: 04/29/18 Follow-up type: In-patient    Ave Filter 04/28/2018, 2:19 PM

## 2018-04-29 MED ORDER — ACETAMINOPHEN 325 MG PO TABS
650.0000 mg | ORAL_TABLET | ORAL | Status: DC | PRN
  Administered 2018-04-29 – 2018-04-30 (×2): 650 mg via ORAL
  Filled 2018-04-29 (×2): qty 2

## 2018-04-30 LAB — TYPE AND SCREEN
ABO/RH(D): O NEG
Antibody Screen: POSITIVE
Unit division: 0
Unit division: 0

## 2018-04-30 LAB — BPAM RBC
BLOOD PRODUCT EXPIRATION DATE: 202001312359
Blood Product Expiration Date: 202001312359
Unit Type and Rh: 9500
Unit Type and Rh: 9500

## 2018-04-30 MED ORDER — SIMETHICONE 80 MG PO CHEW: 80.0000 mg | CHEWABLE_TABLET | ORAL | 0 refills | Status: DC | PRN

## 2018-04-30 MED ORDER — COCONUT OIL OIL: 1.0000 "application " | TOPICAL_OIL | 0 refills | Status: DC | PRN

## 2018-04-30 MED ORDER — SENNOSIDES-DOCUSATE SODIUM 8.6-50 MG PO TABS: 2.0000 | ORAL_TABLET | ORAL | Status: DC

## 2018-04-30 MED ORDER — LABETALOL HCL 100 MG PO TABS: 100.0000 mg | ORAL_TABLET | Freq: Two times a day (BID) | ORAL | 0 refills | Status: DC

## 2018-04-30 MED ORDER — IBUPROFEN 800 MG PO TABS
800.0000 mg | ORAL_TABLET | Freq: Three times a day (TID) | ORAL | Status: DC
  Administered 2018-04-30: 800 mg via ORAL
  Filled 2018-04-30: qty 1

## 2018-04-30 MED ORDER — OXYCODONE HCL 5 MG PO TABS: 5.0000 mg | ORAL_TABLET | ORAL | 0 refills | Status: DC | PRN

## 2018-04-30 MED ORDER — ACETAMINOPHEN 500 MG PO TABS: 1000.0000 mg | ORAL_TABLET | ORAL | Status: AC | PRN

## 2018-04-30 MED ORDER — IBUPROFEN 800 MG PO TABS: 800.0000 mg | ORAL_TABLET | Freq: Three times a day (TID) | ORAL | 0 refills | Status: DC

## 2018-04-30 NOTE — Discharge Summary (Signed)
OB Discharge Summary  Patient Name: Christie Le DOB: 1980/08/20 MRN: [id number]  Date of admission: 04/26/2018 Delivering provider: COUSINS, SHERONETTE   Date of discharge: 04/30/2018  Admitting diagnosis: 45 WKS, ELEVATED BP Intrauterine pregnancy: [redacted]w[redacted]d     Secondary diagnosis:Principal Problem:   Postpartum care following cesarean delivery (1/8) Active Problems:   Anemia   Gestational hypertension   Breech presentation   Previous cesarean section  Additional problems:none     Discharge diagnosis:  Patient Active Problem List   Diagnosis Date Noted  . Postpartum care following cesarean delivery (1/8) 04/27/2018  . Gestational hypertension 04/26/2018  . Breech presentation 04/26/2018  . Previous cesarean section 04/26/2018  . 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 Allergy   . Anemia   . Anxiety   . Vitamin D deficiency                                                                 Post partum procedures:rhogam  Augmentation: NA Pain control: Spinal  Laceration:None  Episiotomy:None  Complications: None  Hospital course:  Sceduled C/S   38 y.o. yo G3P2013 at [redacted]w[redacted]d was admitted to the hospital 04/26/2018 for scheduled cesarean section with the following indication:Elective Repeat and gestational hypertension. Membrane Rupture Time/Date: 12:10 AM ,04/27/2018   Patient delivered a Viable infant.04/27/2018  Details of operation can be found in separate operative note.  Patient had an uncomplicated postpartum course.  Blood pressure maintained in mild range with labetalol PO to continue postpartum outpatient. She is ambulating, tolerating a regular diet, passing flatus, and urinating well. Patient is discharged home in stable condition on  04/30/18, close follow up outpatient in 1 week for BP check.         Physical exam  Vitals:   04/29/18 2316 04/29/18 2316 04/30/18 0035 04/30/18 0519  BP: (!) 145/84 (!) 145/84 134/84 125/87  Pulse: (!) 111 (!) 111 100 95  Resp:  16   16  Temp:  99 F (37.2 C)  98.6 F (37 C)  TempSrc:  Oral    SpO2:  97%  97%  Weight:      Height:       General: alert, cooperative and no distress Lochia: appropriate Uterine Fundus: firm Incision: Healing well with no significant drainage, Dressing is clean, dry, and intact DVT Evaluation: No cords or calf tenderness. No significant calf/ankle edema. Labs: Lab Results  Component Value Date   WBC 11.5 (H) 04/28/2018   HGB 9.5 (L) 04/28/2018   HCT 29.6 (L) 04/28/2018   MCV 94.3 04/28/2018   PLT 177 04/28/2018   CMP Latest Ref Rng & Units 04/28/2018  Glucose 70 - 99 mg/dL 85  BUN 6 - 20 mg/dL 8  Creatinine 0.44 - 1.00 mg/dL 0.51  Sodium 135 - 145 mmol/L 136  Potassium 3.5 - 5.1 mmol/L 4.0  Chloride 98 - 111 mmol/L 107  CO2 22 - 32 mmol/L 22  Calcium 8.9 - 10.3 mg/dL 8.8(L)  Total Protein 6.5 - 8.1 g/dL 6.2(L)  Total Bilirubin 0.3 - 1.2 mg/dL 0.4  Alkaline Phos 38 - 126 U/L 86  AST 15 - 41 U/L 23  ALT 0 - 44 U/L 14    Vaccines: TDaP UTD         Flu  UTD  Discharge instruction: per After Visit Summary and "Baby and Me Booklet".  After Visit Meds:  Allergies as of 04/30/2018   No Known Allergies     Medication List    STOP taking these medications   BONJESTA 20-20 MG Tbcr Generic drug:  Doxylamine-Pyridoxine ER   valACYclovir 1000 MG tablet Commonly known as:  VALTREX     TAKE these medications   acetaminophen 500 MG tablet Commonly known as:  TYLENOL Take 2 tablets (1,000 mg total) by mouth every 4 (four) hours as needed for mild pain. What changed:    when to take this  reasons to take this   azelastine 0.1 % nasal spray Commonly known as:  ASTELIN Place 2 sprays into both nostrils 2 (two) times daily. Use in each nostril as directed What changed:    how much to take  when to take this  reasons to take this   BENGAY EX Apply 1 application topically daily as needed (restless legs).   BREO ELLIPTA 100-25 MCG/INH Aepb Generic drug:   fluticasone furoate-vilanterol inhale 1 PUFF into THE lungs ONCE A DAY. RINSE MOUTH WITH WATER AFTER EACH USE What changed:  See the new instructions.   coconut oil Oil Apply 1 application topically as needed.   fluticasone 50 MCG/ACT nasal spray Commonly known as:  FLONASE USE 2 SPRAYS IN EACH NOSTRIL EVERY DAY What changed:    how much to take  when to take this  reasons to take this   ibuprofen 800 MG tablet Commonly known as:  ADVIL,MOTRIN Take 1 tablet (800 mg total) by mouth 3 (three) times daily.   labetalol 100 MG tablet Commonly known as:  NORMODYNE Take 1 tablet (100 mg total) by mouth 2 (two) times daily.   montelukast 10 MG tablet Commonly known as:  SINGULAIR TAKE 1 TABLET BY MOUTH EVERY DAY What changed:    when to take this  reasons to take this   omeprazole 20 MG capsule Commonly known as:  PRILOSEC Take 20 mg by mouth at bedtime.   OVER THE COUNTER MEDICATION Apply 1 application topically at bedtime. Magneium cream(MG-12). Apply to feet at bedtime for restless legs.   oxyCODONE 5 MG immediate release tablet Commonly known as:  Oxy IR/ROXICODONE Take 1-2 tablets (5-10 mg total) by mouth every 4 (four) hours as needed for moderate pain.   prenatal multivitamin Tabs tablet Take 1 tablet by mouth at bedtime.   PROAIR HFA 108 (90 Base) MCG/ACT inhaler Generic drug:  albuterol INHALE 1-2 PUFFS EVERY 4 HOURS FOR WHEEZING OR SHORTNESS OF BREATH What changed:  See the new instructions.   senna-docusate 8.6-50 MG tablet Commonly known as:  Senokot-S Take 2 tablets by mouth daily. Start taking on:  May 01, 2018   simethicone 80 MG chewable tablet Commonly known as:  MYLICON Chew 1 tablet (80 mg total) by mouth as needed for flatulence.   Vitamin D3 125 MCG (5000 UT) Caps Take 5,000 Units by mouth at bedtime.       Diet: routine diet  Activity: Advance as tolerated. Pelvic rest for 6 weeks.   Postpartum contraception: Not  Discussed  Newborn Data: Live born female  Birth Weight: 6 lb 12.5 oz (3075 g) APGAR: 87, 9  Newborn Delivery   Birth date/time:  04/27/2018 00:10:00 Delivery type:  C-Section, Low Transverse Trial of labor:  No C-section categorization:  Repeat     named Mckenzie Baby Feeding: Breast Disposition:home with mother   Delivery Report:  Review the Delivery Report for details.    Follow up: Follow-up Information    Azucena Fallen, MD. Schedule an appointment as soon as possible for a visit in 1 week(s).   Specialty:  Obstetrics and Gynecology Why:  BP check Contact information: [place] [address] [phone]             Signed: Juliene Pina, CNM, MSN 04/30/2018, 10:27 AM

## 2018-04-30 NOTE — Lactation Note (Signed)
This note was copied from a baby's chart. Lactation Consultation Note  Patient Name: Christie Le RNHAF'B Date: 04/30/2018   Mom pumping every 3 hours.  Right breast starting to feel very full and some hardened areas felt.  Mom denies pain or discomfort.   Pumping when LC entered room.  Infant asleep in bassinet.  Marine assisted mom when she pumped with breast massage on hardened areas.  Ice offered but mom declined.  She feels better after pumping.  Mom recently breastfed infant for 45 minutes then she fed infant 10 ml from bottle.  Mom offered more but infant was too sleepy.    Mom prefers bottle over curved tip syringe which she used after another breastfeed.  She has two pumped bottles of milk LC placed in breastmilk refrigerator.    LC discussed with mom bf basics and answered questions.  Mom is confident infant is swallowing at breast but desires to pump after feeds in order to build supply and to relieve fullness.  Mom is tired so LC suggested with next feed for her to put infant to breast and to massage full areas when she feeds.  Offer both breasts, which mom says she does.  After bf, mom may choose to hand express or hand pump in order to collect milk to feed back to infant.  This may allow mom an opportunity to rest a bit more then.    Mom will call out for any further questions or assistance with pumping or infant feeds.        Maternal Data    Feeding Feeding Type: Breast Fed  LATCH Score Latch: Repeated attempts needed to sustain latch, nipple held in mouth throughout feeding, stimulation needed to elicit sucking reflex.  Audible Swallowing: A few with stimulation  Type of Nipple: Everted at rest and after stimulation  Comfort (Breast/Nipple): Soft / non-tender  Hold (Positioning): No assistance needed to correctly position infant at breast.  LATCH Score: 8  Interventions    Lactation Tools Discussed/Used     Consult Status      Christie Le  [facility]) 04/30/2018, 12:32 AM

## 2018-04-30 NOTE — Lactation Note (Signed)
This note was copied from a baby's chart. Lactation Consultation Note  Patient Name: Girl Bobbijo Holst KGYJE'H Date: 04/30/2018 Reason for consult: Follow-up assessment;Early term 49-38.6wks   Baby [redacted]w[redacted]d, 59 hours old and latching in laid back position upon entering. Reminded mother to massage hard areas of breast during feeding to empty. Mother's breasts are full with areas of hardness but not fully engorged. Mother pumped 130 ml at 0800 of transitional breastmilk. Suggest mother allow infant to regulate her milk supply with some breastmilk supplementation. Discussed decreasing pumping frequency and time, ice and lying flat with massage to relieve hard areas. Mother's nipples are tender and she is using comfort gels and has extra set in room.  Stools are green.  Mom made aware of  breastfeeding support groups, and our phone # for post-discharge questions.        Maternal Data Has patient been taught Hand Expression?: Yes  Feeding Feeding Type: Breast Fed  LATCH Score Latch: Grasps breast easily, tongue down, lips flanged, rhythmical sucking.  Audible Swallowing: A few with stimulation  Type of Nipple: Everted at rest and after stimulation  Comfort (Breast/Nipple): Filling, red/small blisters or bruises, mild/mod discomfort  Hold (Positioning): No assistance needed to correctly position infant at breast.  LATCH Score: 8  Interventions Interventions: DEBP;Comfort gels  Lactation Tools Discussed/Used     Consult Status Consult Status: Complete Date: 04/30/18    Vivianne Master [facility] 04/30/2018, 8:53 AM

## 2018-04-30 NOTE — Progress Notes (Addendum)
Subjective: POD# 3 Live born female  Birth Weight: 6 lb 12.5 oz (3075 g) APGAR: 9, 9  Newborn Delivery   Birth date/time:  04/27/2018 00:10:00 Delivery type:  C-Section, Low Transverse Trial of labor:  No C-section categorization:  Repeat    Baby name: McKenzie Delivering provider: COUSINS, SHERONETTE   Feeding: breast  Pain control at delivery: Spinal   Reports feeling well, ready for DC.  Patient reports tolerating PO.   Breast symptoms: milk is in Pain controlled withacetaminophen and naproxen (OTC) Denies HA/SOB/C/P/N/V/dizziness. Flatus present. She reports vaginal bleeding as normal, without clots.  She is ambulating, urinating without difficulty.     Objective:   VS:    Vitals:   04/29/18 2316 04/29/18 2316 04/30/18 0035 04/30/18 0519  BP: (!) 145/84 (!) 145/84 134/84 125/87  Pulse: (!) 111 (!) 111 100 95  Resp:  16  16  Temp:  99 F (37.2 C)  98.6 F (37 C)  TempSrc:  Oral    SpO2:  97%  97%  Weight:      Height:        No intake or output data in the 24 hours ending 04/30/18 1013      Recent Labs    04/28/18 0600  WBC 11.5*  HGB 9.5*  HCT 29.6*  PLT 177     Blood type: --/--/O NEG (01/08 8891) / infant Rh pos  Rubella: Immune (07/05 0000)  Vaccines: TDaP UTD         Flu    UTD   Physical Exam:  General: alert, cooperative and no distress Abdomen: soft, nontender, normal bowel sounds Incision: clean, dry and intact Uterine Fundus: firm, below umbilicus, nontender Lochia: minimal Ext: no edema, redness or tenderness in the calves or thighs      Assessment/Plan: 38 y.o.   POD# 3. [id number]                  Principal Problem:   Postpartum care following cesarean delivery (1/8) Active Problems:   Anemia   Gestational hypertension   Breech presentation   Previous cesarean section   Doing well, stable.    BP stable on Labetalol, continue as established, PEC precautions and F/U BP check at [facility] in 1 week. Oral Fe supplement to  continue Routine post-op care Rhogam given             DC home today w/ instructions  F/U at [place] in 1 and 6 weeks and PRN   Juliene Pina, CNM, MSN 04/30/2018, 10:13 AM

## 2018-05-06 ENCOUNTER — Encounter (HOSPITAL_COMMUNITY)
Admission: RE | Admit: 2018-05-06 | Discharge: 2018-05-06 | Disposition: A | Discharge status: Home / Self Care | Payer: 59 | Source: Ambulatory Visit

## 2018-05-09 ENCOUNTER — Inpatient Hospital Stay (HOSPITAL_COMMUNITY): Admit: 2018-05-09 | Payer: 59 | Admitting: Obstetrics & Gynecology

## 2018-06-03 ENCOUNTER — Encounter: Payer: Self-pay | Admitting: Physician Assistant

## 2018-06-03 ENCOUNTER — Ambulatory Visit (INDEPENDENT_AMBULATORY_CARE_PROVIDER_SITE_OTHER): Payer: 59 | Admitting: Physician Assistant

## 2018-06-03 VITALS — BP 138/82 | HR 91 | Temp 98.2°F | Ht 66.0 in | Wt 176.2 lb

## 2018-06-03 DIAGNOSIS — B9789 Other viral agents as the cause of diseases classified elsewhere: Secondary | ICD-10-CM

## 2018-06-03 DIAGNOSIS — J329 Chronic sinusitis, unspecified: Secondary | ICD-10-CM | POA: Diagnosis not present

## 2018-06-03 LAB — POC INFLUENZA A&B (BINAX/QUICKVUE)
INFLUENZA B, POC: NEGATIVE
Influenza A, POC: NEGATIVE

## 2018-06-03 NOTE — Progress Notes (Signed)
Subjective:    Patient ID: Christie Le, female    DOB: 04-16-1981, 38 y.o.   MRN: [id number]  HPI 38 y.o. breast feeding WF presents 5 weeks post partum for fever, diarrhea. Highest fever 100.7. Had diarrhea Monday, Tuesday , then Wednesday had bad headache, fever, sore throat, body aches. She is on flonase, mucinex.   Blood pressure 138/82, pulse 91, temperature 98.2 F (36.8 C), height 5\' 6"  (1.676 m), weight 176 lb 3.2 oz (79.9 kg), last menstrual period 06/02/2018, SpO2 97 %, unknown if currently breastfeeding.   Medications Current Outpatient Medications on File Prior to Visit  Medication Sig  . acetaminophen (TYLENOL) 500 MG tablet Take 2 tablets (1,000 mg total) by mouth every 4 (four) hours as needed for mild pain.  Marland Kitchen azelastine (ASTELIN) 0.1 % nasal spray Place 2 sprays into both nostrils 2 (two) times daily. Use in each nostril as directed (Patient taking differently: Place 1 spray into both nostrils 2 (two) times daily as needed for allergies. Use in each nostril as directed)  . BREO ELLIPTA 100-25 MCG/INH AEPB inhale 1 PUFF into THE lungs ONCE A DAY. RINSE MOUTH WITH WATER AFTER EACH USE (Patient taking differently: Inhale 1 puff into the lungs daily. )  . Cholecalciferol (VITAMIN D3) 125 MCG (5000 UT) CAPS Take 5,000 Units by mouth at bedtime.  . fluticasone (FLONASE) 50 MCG/ACT nasal spray USE 2 SPRAYS IN EACH NOSTRIL EVERY DAY (Patient taking differently: Place 1 spray into both nostrils daily as needed for allergies. )  . ibuprofen (ADVIL,MOTRIN) 800 MG tablet Take 1 tablet (800 mg total) by mouth 3 (three) times daily.  Marland Kitchen labetalol (NORMODYNE) 100 MG tablet Take 1 tablet (100 mg total) by mouth 2 (two) times daily.  . montelukast (SINGULAIR) 10 MG tablet TAKE 1 TABLET BY MOUTH EVERY DAY (Patient taking differently: Take 10 mg by mouth at bedtime as needed (allergies). )  . OVER THE COUNTER MEDICATION Apply 1 application topically at bedtime. Magneium cream(MG-12). Apply to  feet at bedtime for restless legs.  . Prenatal Vit-Fe Fumarate-FA (PRENATAL MULTIVITAMIN) TABS tablet Take 1 tablet by mouth at bedtime.  Marland Kitchen PROAIR HFA 108 (90 Base) MCG/ACT inhaler INHALE 1-2 PUFFS EVERY 4 HOURS FOR WHEEZING OR SHORTNESS OF BREATH (Patient taking differently: Inhale 1-2 puffs into the lungs every 4 (four) hours as needed for wheezing or shortness of breath. )  . senna-docusate (SENOKOT-S) 8.6-50 MG tablet Take 2 tablets by mouth daily.  . simethicone (MYLICON) 80 MG chewable tablet Chew 1 tablet (80 mg total) by mouth as needed for flatulence.   No current facility-administered medications on file prior to visit.     Problem list She has Allergy; Anemia; Anxiety; Vitamin D deficiency; Ulnar nerve entrapment, left; Gestational hypertension; Breech presentation; Previous cesarean section; and Postpartum care following cesarean delivery (1/8) on their problem list.   Review of Systems  Constitutional: Positive for chills and fever. Negative for activity change, appetite change, diaphoresis, fatigue and unexpected weight change.  HENT: Positive for postnasal drip. Negative for congestion, ear pain, sinus pressure, sneezing, sore throat and trouble swallowing.   Respiratory: Positive for cough. Negative for chest tightness, shortness of breath and wheezing.   Cardiovascular: Negative.   Gastrointestinal: Negative.   Genitourinary: Negative.   Musculoskeletal: Positive for myalgias. Negative for neck pain.  Neurological: Positive for headaches.       Objective:   Physical Exam Constitutional:      Appearance: She is well-developed.  HENT:  Head: Normocephalic and atraumatic.     Right Ear: External ear normal.     Left Ear: External ear normal.  Eyes:     Conjunctiva/sclera: Conjunctivae normal.     Pupils: Pupils are equal, round, and reactive to light.  Neck:     Musculoskeletal: Normal range of motion and neck supple.     Thyroid: No thyromegaly.   Cardiovascular:     Rate and Rhythm: Normal rate and regular rhythm.     Heart sounds: Normal heart sounds. No murmur. No friction rub. No gallop.   Pulmonary:     Effort: Pulmonary effort is normal. No respiratory distress.     Breath sounds: Normal breath sounds. No wheezing.  Abdominal:     General: A surgical scar is present. Bowel sounds are normal. There is no distension.     Palpations: Abdomen is soft. There is no mass.     Tenderness: There is generalized abdominal tenderness. There is no right CVA tenderness, left CVA tenderness, guarding or rebound.     Comments: Well healing horizontal surgical scar without discharge, erythema, swelling.   Musculoskeletal: Normal range of motion.  Lymphadenopathy:     Cervical: No cervical adenopathy.  Skin:    General: Skin is warm and dry.  Neurological:     Mental Status: She is alert and oriented to person, place, and time.     Cranial Nerves: No cranial nerve deficit.     Coordination: Coordination normal.     Deep Tendon Reflexes: Reflexes normal.         Assessment & Plan:  Christie Le was seen today for acute visit, diarrhea, headache, cough, sore throat, fever and chills.  Diagnoses and all orders for this visit:  Viral sinusitis Breast feeding and has gestational HTN Continue labetalol Avoid sudafed and meds with that in it Continue nasal spray, tylenol, mucinex If not better 7-10 days or worsening symptoms call the office or go to UC Negative flu test

## 2018-06-03 NOTE — Patient Instructions (Addendum)
Can do a steroid nasal spary 1-2 sparys at night each nostril.  Remember to spray each nostril twice towards the outer part of your eye.   Do not sniff but instead pinch your nose and tilt your head back to help the medicine get into your sinuses.   The best time to do this is at bedtime.  Stop if you get blurred vision or nose bleeds.   THIS WILL TAKE 7 DAYS TO WORK AND IS BETTER IF YOU START BEFORE SYMPTOMS SO IF YOU HAVE A SEASON OR TIME OF THE YEAR YOU ALWAYS GET A COLD, START BEFORE THAT!   COLD INFORMATION  Try just the allergy pill and prednisone for a few days, you always want to give your body 10 days to fight off infection with help before taking an anabiotic.   BEWARE ANTIBIOTICS Antibiotics have been linked with colon infections, resistance and newest theory is colon cancer in 40-50 year olds. So it is VERY important to try to avoid them, antibiotics are NOT risk free medications.   If you are not feeling better make an office visit OR CONTACT us.   Here is more info below HOW TO TREAT VIRAL COUGH AND COLD SYMPTOMS:  -Symptoms usually last at least 1 week with the worst symptoms being around day 4.  - colds usually start with a sore throat and end with a cough, and the cough can take 2 weeks to get better.  -No antibiotics are needed for colds, flu, sore throats, cough, bronchitis UNLESS symptoms are longer than 7 days OR if you are getting better then get drastically worse.  -There are a lot of combination medications (Dayquil, Nyquil, Vicks 44, tyelnol cold and sinus, ETC). Please look at the ingredients on the back so that you are treating the correct symptoms and not doubling up on medications/ingredients.    Medicines you can use  Nasal congestion  Little Remedies saline spray (aerosol/mist)- can try this, it is in the kids section -Dextormethorphan + chlorpheniramine (Coridcidin HBP)- okay if you have high blood pressure -Oxymetazoline (Afrin) nasal spray- LIMIT to 3  days -Saline nasal spray -Neti pot (used distilled or bottled water)  Ear pain/congestion  - Nasonex/flonase nasal spray  Fever  -Acetaminophen (Tyelnol)  Sore Throat  -Acetaminophen (Tyelnol) -Drink a lot of water -Gargle with salt water - Rest your voice (don't talk) -Throat sprays -Cough drops  Body Aches  -Acetaminophen (Tyelnol)  Headache  -Acetaminophen (Tyelnol)  Cough  -Dextromethorphan (Delsym)- medicine that has DM in it -Guafenesin (Mucinex/Robitussin) - cough drops - drink lots of water  Chest Congestion  -Guafenesin (Mucinex/Robitussin)  Red Itchy Eyes  - Naphcon-A  General health when sick  -Hydration -wash your hands frequently -keep surfaces clean -change pillow cases and sheets often -Get fresh air but do not exercise strenuously -Vitamin D, double up on it - Vitamin C -Zinc

## 2018-06-28 ENCOUNTER — Other Ambulatory Visit: Payer: Self-pay | Admitting: Physician Assistant

## 2018-07-04 ENCOUNTER — Other Ambulatory Visit: Payer: Self-pay | Admitting: Physician Assistant

## 2018-07-04 DIAGNOSIS — J45901 Unspecified asthma with (acute) exacerbation: Secondary | ICD-10-CM

## 2018-07-13 ENCOUNTER — Encounter: Payer: Self-pay | Admitting: Physician Assistant

## 2018-07-13 DIAGNOSIS — O165 Unspecified maternal hypertension, complicating the puerperium: Secondary | ICD-10-CM

## 2018-07-14 MED ORDER — LABETALOL HCL 100 MG PO TABS: 100.0000 mg | ORAL_TABLET | Freq: Two times a day (BID) | ORAL | 0 refills | Status: DC

## 2018-07-14 NOTE — Telephone Encounter (Signed)
Patient with history of gestational diabetes, taken herself off labetalol, had elevated BP at home, requesting refill. Will refill for patient.  Go to the ER if any chest pain, shortness of breath, nausea, dizziness, severe HA, changes vision/speech

## 2018-07-15 ENCOUNTER — Encounter: Payer: Self-pay | Admitting: Physician Assistant

## 2018-08-04 DIAGNOSIS — L0291 Cutaneous abscess, unspecified: Secondary | ICD-10-CM | POA: Diagnosis not present

## 2018-08-04 MED ORDER — CEPHALEXIN 500 MG PO CAPS: 500.0000 mg | ORAL_CAPSULE | Freq: Four times a day (QID) | ORAL | 0 refills | Status: DC

## 2018-08-04 NOTE — Telephone Encounter (Signed)
THIS ENCOUNTER IS A VIRTUAL VISIT DUE TO COVID-19 - PATIENT WAS NOT SEEN IN THE OFFICE.  PATIENT HAS CONSENTED TO VIRTUAL VISIT / TELEMEDICINE VISIT   Virtual Visit via telephone Note  I connected with Christie Le on 08/04/2018 08/04/2018  by telephone.  I verified that I am speaking with the correct person using two identifiers.    I discussed the limitations of evaluation and management by telemedicine and the availability of in person appointments. The patient expressed understanding and agreed to proceed.  History of Present Illness: 38 y.o. WF post partum, still breast feeding calls with 3 areas of ingrown hair areas, red, swollen, tender, she was able to express pus from one on her left armpit and left vaginal area. She states there is one still on her inner left thigh, size of a dime or nickel, warm, red, expressing blood but it is painful and feels it is getting worse.   Medications   Current Outpatient Medications (Cardiovascular):  .  labetalol (NORMODYNE) 100 MG tablet, Take 1 tablet (100 mg total) by mouth 2 (two) times daily.  Current Outpatient Medications (Respiratory):  .  azelastine (ASTELIN) 0.1 % nasal spray, Place 2 sprays into both nostrils 2 (two) times daily. Use in each nostril as directed (Patient taking differently: Place 1 spray into both nostrils 2 (two) times daily as needed for allergies. Use in each nostril as directed) .  BREO ELLIPTA 100-25 MCG/INH AEPB, inhale 1 PUFF into THE lungs ONCE A DAY. RINSE MOUTH WITH WATER AFTER EACH USE .  fluticasone (FLONASE) 50 MCG/ACT nasal spray, USE 2 SPRAYS IN EACH NOSTRIL EVERY DAY .  montelukast (SINGULAIR) 10 MG tablet, TAKE 1 TABLET BY MOUTH EVERY DAY (Patient taking differently: Take 10 mg by mouth at bedtime as needed (allergies). ) .  PROAIR HFA 108 (90 Base) MCG/ACT inhaler, INHALE 1-2 PUFFS EVERY 4 HOURS FOR WHEEZING OR SHORTNESS OF BREATH (Patient taking differently: Inhale 1-2 puffs into the lungs every 4 (four)  hours as needed for wheezing or shortness of breath. ) .  promethazine-dextromethorphan (PROMETHAZINE-DM) 6.25-15 MG/5ML syrup, TAKE 5 mls BY MOUTH 4 TIMES DAILY AS NEEDED FOR COUGH  Current Outpatient Medications (Analgesics):  .  acetaminophen (TYLENOL) 500 MG tablet, Take 2 tablets (1,000 mg total) by mouth every 4 (four) hours as needed for mild pain. Marland Kitchen  ibuprofen (ADVIL,MOTRIN) 800 MG tablet, Take 1 tablet (800 mg total) by mouth 3 (three) times daily.   Current Outpatient Medications (Other):  Marland Kitchen  buPROPion (WELLBUTRIN XL) 150 MG 24 hr tablet, TAKE 2 TABLETS BY MOUTH EVERY MORNING .  cephALEXin (KEFLEX) 500 MG capsule, Take 1 capsule (500 mg total) by mouth 4 (four) times daily. For 10 days.  Take with food. .  Cholecalciferol (VITAMIN D3) 125 MCG (5000 UT) CAPS, Take 5,000 Units by mouth at bedtime. Marland Kitchen  OVER THE COUNTER MEDICATION, Apply 1 application topically at bedtime. Magneium cream(MG-12). Apply to feet at bedtime for restless legs. .  Prenatal Vit-Fe Fumarate-FA (PRENATAL MULTIVITAMIN) TABS tablet, Take 1 tablet by mouth at bedtime. .  senna-docusate (SENOKOT-S) 8.6-50 MG tablet, Take 2 tablets by mouth daily. .  simethicone (MYLICON) 80 MG chewable tablet, Chew 1 tablet (80 mg total) by mouth as needed for flatulence. .  valACYclovir (VALTREX) 1000 MG tablet, TAKE TWO TABLETS BY MOUTH TWICE DAILY  Problem list She has Allergy; Anemia; Anxiety; Vitamin D deficiency; Ulnar nerve entrapment, left; Gestational hypertension; Breech presentation; Previous cesarean section; and Postpartum care following cesarean  delivery (1/8) on their problem list.   Observations/Objective: General Appearance:Well sounding, in no apparent distress.  ENT/Mouth: No hoarseness, No cough for duration of visit.  Respiratory: completing full sentences without distress, without audible wheeze Neuro: Awake and oriented X 3,  Psych:  Insight and Judgment appropriate.   Assessment and Plan: Diagnoses and  all orders for this visit:  Abscess -     cephALEXin (KEFLEX) 500 MG capsule; Take 1 capsule (500 mg total) by mouth 4 (four) times daily. For 10 days.  Take with food.  Do warm compresses Only take keflex if it is not improving Exfoliate the area   Follow Up Instructions:    I discussed the assessment and treatment plan with the patient. The patient was provided an opportunity to ask questions and all were answered. The patient agreed with the plan and demonstrated an understanding of the instructions.   The patient was advised to call back or seek an in-person evaluation if the symptoms worsen or if the condition fails to improve as anticipated.  I provided 30 minutes of non-face-to-face time during this encounter.   Vicie Mutters, PA-C

## 2018-08-09 ENCOUNTER — Other Ambulatory Visit: Payer: Self-pay | Admitting: Physician Assistant

## 2018-08-23 MED ORDER — ATENOLOL 50 MG PO TABS: ORAL_TABLET | ORAL | 3 refills | Status: DC

## 2018-08-27 ENCOUNTER — Emergency Department (HOSPITAL_BASED_OUTPATIENT_CLINIC_OR_DEPARTMENT_OTHER)
Admission: EM | Admit: 2018-08-27 | Discharge: 2018-08-27 | Disposition: A | Discharge status: Home / Self Care | Payer: 59 | Attending: Emergency Medicine | Admitting: Emergency Medicine

## 2018-08-27 ENCOUNTER — Encounter (HOSPITAL_BASED_OUTPATIENT_CLINIC_OR_DEPARTMENT_OTHER): Payer: Self-pay | Admitting: *Deleted

## 2018-08-27 ENCOUNTER — Other Ambulatory Visit: Payer: Self-pay

## 2018-08-27 ENCOUNTER — Emergency Department (HOSPITAL_BASED_OUTPATIENT_CLINIC_OR_DEPARTMENT_OTHER): Payer: 59

## 2018-08-27 DIAGNOSIS — X501XXA Overexertion from prolonged static or awkward postures, initial encounter: Secondary | ICD-10-CM | POA: Diagnosis not present

## 2018-08-27 DIAGNOSIS — J45909 Unspecified asthma, uncomplicated: Secondary | ICD-10-CM | POA: Insufficient documentation

## 2018-08-27 DIAGNOSIS — M25571 Pain in right ankle and joints of right foot: Secondary | ICD-10-CM | POA: Insufficient documentation

## 2018-08-27 DIAGNOSIS — I10 Essential (primary) hypertension: Secondary | ICD-10-CM | POA: Insufficient documentation

## 2018-08-27 DIAGNOSIS — Z79899 Other long term (current) drug therapy: Secondary | ICD-10-CM | POA: Insufficient documentation

## 2018-08-27 NOTE — Discharge Instructions (Signed)
Please take Ibuprofen (Advil, motrin) and Tylenol (acetaminophen) to relieve your pain.  You may take up to 600 MG (3 pills) of normal strength ibuprofen every 6-8 hours as needed.  In between doses of ibuprofen you make take tylenol, up to 1,000 mg (two extra strength pills).  Do not take more than 4,000 mg tylenol in a 24 hour period.  Please check all medication labels as many medications such as pain and cold medications may contain tylenol.  Do not drink alcohol while taking these medications.  Do not take other NSAID'S while taking ibuprofen (such as aleve or naproxen).  Please take ibuprofen with food to decrease stomach upset.

## 2018-08-27 NOTE — ED Triage Notes (Signed)
Right foot got caught on the curb of the parking lot and she roll her foot on the curb and fell on her right side.

## 2018-08-27 NOTE — ED Notes (Signed)
PMS intact before and after. Pt tolerated well. All questions answered. 

## 2018-08-27 NOTE — ED Provider Notes (Signed)
[facility] EMERGENCY DEPARTMENT Provider Note   CSN: [id number] Arrival date & time: 08/27/18  1816    History   Chief Complaint Chief Complaint  Patient presents with  . Fall    HPI Christie Le is a 38 y.o. female with past medical history of hypertension presents today for evaluation of right foot and ankle pain.  She reports that she was walking in a parking lot outside [place] when she went to step up onto the sidewalk however the curb was reportedly uneven.  She says that she hit the top of her foot on the curb and rolled her ankle causing her to fall.  This happened at about 5 hours prior to arrival.  She has been able to bear weight however reports that it is very painful.  Denies striking her head or passing out.  Reports that this was a mechanical fall.  No other injuries.  She took Tylenol prior to arrival which reduced the pain to a 4 out of 10.  She is approximately 4 to 5 months postpartum and is breast-feeding.     HPI  Past Medical History:  Diagnosis Date  . Anemia    hx of  . Asthma    no problems since pregnancy  . Family history of anesthesia complication    "mom is sensitive- severe n/v"  . Fibrocystic breast   . Pneumonia    age 31  . PONV (postoperative nausea and vomiting)   . Pregnancy induced hypertension   . S/P laparoscopic cholecystectomy June 15 09/25/2013   Single small stone; chronic changes;  Small bile ducts   . Seasonal allergies   . Vitamin D deficiency     Patient Active Problem List   Diagnosis Date Noted  . Postpartum care following cesarean delivery (1/8) 04/27/2018  . Gestational hypertension 04/26/2018  . Breech presentation 04/26/2018  . Previous cesarean section 04/26/2018  . 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 Allergy   . Anemia   . Anxiety   . Vitamin D deficiency     Past Surgical History:  Procedure Laterality Date  . CESAREAN SECTION  07/29/2009  . CESAREAN SECTION N/A 04/26/2018   Procedure:  Repeat CESAREAN SECTION;  Surgeon: Servando Salina, MD;  Location: [facility];  Service: Obstetrics;  Laterality: N/A;  EDD: 05/16/18  . CHOLECYSTECTOMY N/A 09/25/2013   Procedure: LAPAROSCOPIC CHOLECYSTECTOMY WITH INTRAOPERATIVE CHOLANGIOGRAM;  Surgeon: Pedro Earls, MD;  Location: [facility];  Service: General;  Laterality: N/A;  . WISDOM TOOTH EXTRACTION  2002     OB History    Gravida  3   Para  2   Term  2   Preterm      AB  1   Living  3     SAB  1   TAB      Ectopic      Multiple  1   Live Births  3            Home Medications    Prior to Admission medications   Medication Sig Start Date End Date Taking? Authorizing Provider  acetaminophen (TYLENOL) 500 MG tablet Take 2 tablets (1,000 mg total) by mouth every 4 (four) hours as needed for mild pain. 04/30/18  Yes Juliene Pina, CNM  atenolol (TENORMIN) 50 MG tablet Start 1/2 at night, can increase to 1 pill if needed for BP 08/23/18  Yes Vicie Mutters, PA-C  azelastine (ASTELIN) 0.1 % nasal spray Place 2 sprays  into both nostrils 2 (two) times daily. Use in each nostril as directed Patient taking differently: Place 1 spray into both nostrils 2 (two) times daily as needed for allergies. Use in each nostril as directed 04/03/16 08/27/18 Yes Ollis, Courtney, PA-C  busPIRone (BUSPAR) 5 MG tablet Take 5 mg by mouth at bedtime.   Yes [provider]  Cholecalciferol (VITAMIN D3) 125 MCG (5000 UT) CAPS Take 5,000 Units by mouth at bedtime.   Yes [provider]  fluticasone (FLONASE) 50 MCG/ACT nasal spray USE 2 SPRAYS IN EACH NOSTRIL EVERY DAY 07/04/18  Yes Vicie Mutters, PA-C  montelukast (SINGULAIR) 10 MG tablet Take 1 tablet Daily for Allergies 08/09/18  Yes Unk Pinto, MD  Prenatal Vit-Fe Fumarate-FA (PRENATAL MULTIVITAMIN) TABS tablet Take 1 tablet by mouth at bedtime.   Yes [provider]  BREO ELLIPTA 100-25 MCG/INH AEPB inhale 1 PUFF into THE lungs ONCE A DAY. RINSE  MOUTH WITH WATER AFTER [facility] USE 07/04/18   Vicie Mutters, PA-C  buPROPion (WELLBUTRIN XL) 150 MG 24 hr tablet TAKE 2 TABLETS BY MOUTH EVERY MORNING 07/04/18   Vicie Mutters, PA-C  cephALEXin (KEFLEX) 500 MG capsule Take 1 capsule (500 mg total) by mouth 4 (four) times daily. For 10 days.  Take with food. 08/04/18   Vicie Mutters, PA-C  ibuprofen (ADVIL,MOTRIN) 800 MG tablet Take 1 tablet (800 mg total) by mouth 3 (three) times daily. 04/30/18   Juliene Pina, CNM  labetalol (NORMODYNE) 100 MG tablet Take 1 tablet (100 mg total) by mouth 2 (two) times daily. 07/14/18   Vicie Mutters, PA-C  OVER THE COUNTER MEDICATION Apply 1 application topically at bedtime. Magneium cream(MG-12). Apply to feet at bedtime for restless legs.    [provider]  PROAIR HFA 108 (90 Base) MCG/ACT inhaler INHALE 1-2 PUFFS EVERY 4 HOURS FOR WHEEZING OR SHORTNESS OF BREATH Patient taking differently: Inhale 1-2 puffs into the lungs every 4 (four) hours as needed for wheezing or shortness of breath.  12/09/17   Liane Comber, NP  promethazine-dextromethorphan (PROMETHAZINE-DM) 6.25-15 MG/5ML syrup TAKE 5 mls BY MOUTH 4 TIMES DAILY AS NEEDED FOR COUGH 07/04/18   Vicie Mutters, PA-C  senna-docusate (SENOKOT-S) 8.6-50 MG tablet Take 2 tablets by mouth daily. 05/01/18   Juliene Pina, CNM  simethicone (MYLICON) 80 MG chewable tablet Chew 1 tablet (80 mg total) by mouth as needed for flatulence. 04/30/18   Juliene Pina, CNM  valACYclovir (VALTREX) 1000 MG tablet TAKE TWO TABLETS BY MOUTH TWICE DAILY 06/28/18   Vicie Mutters, PA-C    Family History Family History  Problem Relation Age of Onset  . Hypertension Mother   . Diabetes Father   . Cancer Father        prostate  . Hypertension Other   . Cancer Maternal Grandmother   . COPD Maternal Grandmother   . Heart disease Maternal Grandmother     Social History Social History   Tobacco Use  . Smoking status: Never Smoker  . Smokeless tobacco: Never  Used  Substance Use Topics  . Alcohol use: Not Currently    Comment: rare  . Drug use: No     Allergies   Patient has no known allergies.   Review of Systems Review of Systems  Constitutional: Negative for chills and fever.  Musculoskeletal:       Pain in right foot and ankle.  Skin: Positive for wound.  Neurological: Negative for weakness and headaches.  All other systems reviewed and are  negative.    Physical Exam Updated Vital Signs BP 121/89 (BP Location: Right Arm)   Pulse 77   Temp 99.1 F (37.3 C) (Oral)   Resp 18   Ht 5\' 6"  (1.676 m)   Wt 77.1 kg   LMP 06/14/2018   SpO2 100%   BMI 27.44 kg/m   Physical Exam Vitals signs and nursing note reviewed.  Constitutional:      General: She is not in acute distress.    Appearance: She is not ill-appearing.  HENT:     Head: Normocephalic.  Cardiovascular:     Rate and Rhythm: Normal rate.     Pulses: Normal pulses.     Comments: 2+ DP/PT pulse in right foot Pulmonary:     Effort: Pulmonary effort is normal. No respiratory distress.  Musculoskeletal:     Comments: Right lower extremity: There is generalized tenderness to palpation along the dorsum and lateral aspect of the right foot and ankle.  There is no proximal lower leg tenderness to palpation.  No crepitus or deformities.  Mild edema around the lateral malleolus.  Skin:    Comments: 2 cm superficial abrasion on the dorsum of the right foot.  No ecchymosis.  Neurological:     Mental Status: She is alert.     Comments: Sensation intact to right lower extremity.      ED Treatments / Results  Labs (all labs ordered are listed, but only abnormal results are displayed) Labs Reviewed - No data to display  EKG None  Radiology Dg Ankle Complete Right  Result Date: 08/27/2018 CLINICAL DATA:  Twisted ankle, lateral pain EXAM: RIGHT ANKLE - COMPLETE 3+ VIEW; RIGHT FOOT COMPLETE - 3+ VIEW COMPARISON:  None. FINDINGS: There is a tiny osseous body adjacent  to the right cuboid, consistent with an os peroneum and does not represent an avulsion fracture. No evidence of fracture or dislocation of the right foot or right ankle. Joint spaces are well preserved. IMPRESSION: There is a tiny osseous body adjacent to the right cuboid, consistent with an os peroneum and does not represent an avulsion fracture. No evidence of fracture or dislocation of the right foot or right ankle. Joint spaces are well preserved. Electronically Signed   By: Eddie Candle M.D.   On: 08/27/2018 18:59   Dg Foot Complete Right  Result Date: 08/27/2018 CLINICAL DATA:  Twisted ankle, lateral pain EXAM: RIGHT ANKLE - COMPLETE 3+ VIEW; RIGHT FOOT COMPLETE - 3+ VIEW COMPARISON:  None. FINDINGS: There is a tiny osseous body adjacent to the right cuboid, consistent with an os peroneum and does not represent an avulsion fracture. No evidence of fracture or dislocation of the right foot or right ankle. Joint spaces are well preserved. IMPRESSION: There is a tiny osseous body adjacent to the right cuboid, consistent with an os peroneum and does not represent an avulsion fracture. No evidence of fracture or dislocation of the right foot or right ankle. Joint spaces are well preserved. Electronically Signed   By: Eddie Candle M.D.   On: 08/27/2018 18:59    Procedures Procedures (including critical care time)  Medications Ordered in ED Medications - No data to display   Initial Impression / Assessment and Plan / ED Course  I have reviewed the triage vital signs and the nursing notes.  Pertinent labs & imaging results that were available during my care of the patient were reviewed by me and considered in my medical decision making (see chart for details).  Patient presents today for evaluation of right foot and ankle pain after a mechanical fall that occurred approximately 5 hours prior to arrival.  She denies striking her head or any other injuries.  She has tenderness to palpation and  swelling over the lateral aspect of her ankle and lateral foot.  Foot and ankle x-rays were obtained without evidence of fracture or dislocation, suspect ankle sprain.  We discussed treatment options, and she elected for Aircast, and crutches.  Recommended PCP follow-up or Ortho follow-up if does not improve in 1 week.  OTC pain medicine, rice.  Return precautions were discussed with patient who states their understanding.  At the time of discharge patient denied any unaddressed complaints or concerns.  Patient is agreeable for discharge home.   Final Clinical Impressions(s) / ED Diagnoses   Final diagnoses:  Acute right ankle pain    ED Discharge Orders    None       Ollen Gross 08/27/18 Egbert Garibaldi, MD 08/28/18 [phone]

## 2018-09-02 ENCOUNTER — Encounter: Payer: Self-pay | Admitting: Physician Assistant

## 2018-09-19 ENCOUNTER — Ambulatory Visit (INDEPENDENT_AMBULATORY_CARE_PROVIDER_SITE_OTHER): Payer: 59 | Admitting: Adult Health

## 2018-09-19 ENCOUNTER — Encounter: Payer: Self-pay | Admitting: Adult Health

## 2018-09-19 ENCOUNTER — Other Ambulatory Visit: Payer: Self-pay

## 2018-09-19 VITALS — BP 116/74 | HR 68 | Temp 97.3°F | Wt 171.0 lb

## 2018-09-19 DIAGNOSIS — D485 Neoplasm of uncertain behavior of skin: Secondary | ICD-10-CM | POA: Diagnosis not present

## 2018-09-19 DIAGNOSIS — L819 Disorder of pigmentation, unspecified: Secondary | ICD-10-CM | POA: Diagnosis not present

## 2018-09-19 DIAGNOSIS — N649 Disorder of breast, unspecified: Secondary | ICD-10-CM | POA: Diagnosis not present

## 2018-09-19 DIAGNOSIS — L988 Other specified disorders of the skin and subcutaneous tissue: Secondary | ICD-10-CM

## 2018-09-19 NOTE — Patient Instructions (Signed)
Can do tylenol or ibuprofen if needed   Keep dry 48 hours or longer if dressing stays on  Monitor for redness, discharge  Results should result in <1 week   Skin Biopsy A skin biopsy is a procedure to remove a sample of your skin (lesion) so that it can be checked under a microscope. You may need a skin biopsy if you have a skin disease or abnormal changes in your skin. Tell a health care provider about:  Any allergies you have.  All medicines you are taking, including vitamins, herbs, eye drops, creams, and over-the-counter medicines.  Any problems you or family members have had with anesthetic medicines.  Any blood disorders you have.  Any surgeries you have had.  Any medical conditions you have or have had.  Whether you are pregnant or may be pregnant. What are the risks? Generally, this is a safe procedure. However, problems may occur, including:  Infection.  Bleeding.  Allergic reaction to medicines.  Scarring. What happens before the procedure?  Ask your health care provider about: ? Changing or stopping your regular medicines. This is especially important if you are taking blood thinners. ? Taking medicines such as aspirin and ibuprofen. These medicines can thin your blood. Do not take these medicines unless your health care provider tells you to take them. ? Taking over-the-counter medicines, vitamins, herbs, and supplements.  Ask your health care provider if you will need someone to take you home from the hospital or clinic after the procedure.  Ask your health care provider how your biopsy site will be marked or identified.  Ask your health care provider what steps will be taken to help prevent infection. These may include: ? Removing hair at the surgery site. ? Washing skin with a germ-killing soap. ? Taking antibiotic medicine. What happens during the procedure?   You may be given medicine to numb the area (local anesthetic).  Your health care  provider will take a sample using one of these steps, depending on the type of skin problem that you have: ? Shave biopsy. Your health care provider will shave away layers of your skin lesion with a sharp blade. After shaving, a gel or ointment may be used to control bleeding. ? Punch biopsy. Your health care provider will use a tool to remove all or part of the lesion. This leaves a small hole about the width of a pencil eraser. The area may be covered with a gel or ointment. ? Excisional or incisional biopsy. Your health care provider will use a surgical blade to remove all or part of your lesion.  Your skin biopsy site may be closed with stitches (sutures).  A bandage (dressing) will be applied. The procedure may vary among health care providers and hospitals. What happens after the procedure?  Your skin sample will be sent to a lab for tests.  Your skin biopsy site will be watched to make sure that it stops bleeding.  You will be given instructions on how to care for your biopsy site.  It is up to you to get the results of your procedure. Ask your health care provider, or the department that is doing the procedure, when your results will be ready. Summary  A skin biopsy is a procedure to remove a sample of your skin (lesion) so that it can be checked under a microscope.  Tell a health care provider about your medical history and all medicines you are taking, including vitamins, herbs, eye drops, creams,  and over-the-counter medicines.  Before the procedure, ask your health care provider about changing or stopping your regular medicines.  During the procedure, your health care provider will take a skin sample from the area where you have the skin problem.  After the procedure, your skin sample will be sent to a laboratory for testing. This information is not intended to replace advice given to you by your health care provider. Make sure you discuss any questions you have with your  health care provider. Document Released: 05/08/2004 Document Revised: 10/04/2017 Document Reviewed: 10/04/2017 Elsevier Interactive Patient Education  2019 [employer].

## 2018-09-19 NOTE — Progress Notes (Signed)
Subjective:     Christie Le is a 38 y.o. female who was referred to me for evaluation and treatment of a skin lesion of the L breast. The lesion has been present for 5-10 years. Lesion has changed in 2 years. Symptoms associated with the lesion are: increasing diameter, darkening color, none, increasing irregularity of borders. Patient denies increasing thickness, increasing number of lesions, itching, bleeding, tendency to be traumatized, pain, drainage.  She had cesarean sect #3 on 04/26/2018 and had girl # 4, she is breastfeeding.  The following portions of the patient's history were reviewed and updated as appropriate: allergies, current medications, past family history, past medical history, past social history, past surgical history and problem list.  She denies personal history of skin lesions, mother did have unknown type of skin cancer (not melanoma) removed from nose in her 71s.   Review of Systems Pertinent items noted in HPI and remainder of comprehensive ROS otherwise negative.    Objective:    Physical Exam General Skin Exam:  no rashes, no wounds   Lesion   Location:   inferior left breast  Color:  brown   Diameter:  12 mm  Border/Symmetry:  irregular surface, with irregular borders.  Inflammation:  absent  Adhesion:   adherent to adjacent tissues.     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 Biopsy discussed and shave biopsy performed today.    Pre-operative Diagnosis: atypical nevus of left breast  Post-operative Diagnosis: atypical nevus of left breast  Locations:inferior, left breast  Indications:  Patient complains of skin tags that are recurrently irritated.   Anesthesia: Lidocaine 1% without epinephrine  Procedure Details  The risks (including bleeding and infection) and benefits of the procedure and Verbal informed consent obtained. Using steril 10 blade, shave biopsy from center of lesion was obtained after cleansing with Alcohol.   Bleeding was controlled with electrocautery. Topical abx and dressing of gauze and tegaderm applied.   Procedure Code  61950  Findings: Shave biopsy sent for pathology  Condition: Stable  Complications: none.  Plan: 1. Instructed to keep the wounds dry and covered for 24-48h and clean thereafter. 2. Warning signs of infection were reviewed.   3. Recommended that the patient use OTC acetaminophen and OTC ibuprofen as needed for pain.  4. Return as needed.   Plan:    1. Shave biopsy obtained under local anesthesia 2. Written patient instruction given. 3. Follow up as scheduled.   4. If benign results, no further interventions; if concerning will refer to derm/skin surgery center for excision and management.

## 2018-09-21 ENCOUNTER — Other Ambulatory Visit: Payer: Self-pay | Admitting: Adult Health

## 2018-09-21 DIAGNOSIS — D239 Other benign neoplasm of skin, unspecified: Secondary | ICD-10-CM

## 2018-09-21 DIAGNOSIS — Z86018 Personal history of other benign neoplasm: Secondary | ICD-10-CM | POA: Insufficient documentation

## 2018-10-18 ENCOUNTER — Other Ambulatory Visit: Payer: Self-pay | Admitting: Physician Assistant

## 2018-11-03 ENCOUNTER — Encounter: Payer: Self-pay | Admitting: Adult Health

## 2018-11-14 ENCOUNTER — Other Ambulatory Visit: Payer: Self-pay | Admitting: Physician Assistant

## 2018-11-14 MED ORDER — KETOROLAC TROMETHAMINE 10 MG PO TABS: 10.0000 mg | ORAL_TABLET | Freq: Four times a day (QID) | ORAL | 0 refills | Status: DC | PRN

## 2018-11-15 ENCOUNTER — Encounter: Payer: Self-pay | Admitting: Physician Assistant

## 2018-11-15 ENCOUNTER — Ambulatory Visit (INDEPENDENT_AMBULATORY_CARE_PROVIDER_SITE_OTHER): Payer: 59 | Admitting: Physician Assistant

## 2018-11-15 ENCOUNTER — Other Ambulatory Visit: Payer: Self-pay

## 2018-11-15 VITALS — BP 120/84 | HR 59 | Temp 97.3°F | Ht 66.0 in | Wt 173.2 lb

## 2018-11-15 DIAGNOSIS — R300 Dysuria: Secondary | ICD-10-CM | POA: Diagnosis not present

## 2018-11-15 DIAGNOSIS — R109 Unspecified abdominal pain: Secondary | ICD-10-CM | POA: Diagnosis not present

## 2018-11-15 MED ORDER — AMOXICILLIN-POT CLAVULANATE 875-125 MG PO TABS: 1.0000 | ORAL_TABLET | Freq: Two times a day (BID) | ORAL | 0 refills | Status: DC

## 2018-11-15 NOTE — Progress Notes (Signed)
Subjective:    Patient ID: Christie Le, female    DOB: Jun 29, 1980, 38 y.o.   MRN: [id number]  HPI 38 y.o. breast feeding WF presents with left flank pain and urinary frequency.  She messaged 11/14/18 feeling that she was having a kidney stone, was told she had one during pregnancy that passed renal US 03/30/2018. She has had diarrhea, left flank pain, lower suprapubic pain, nausea, no vomiting, fever, chills, urinary incontinence.   Oral Toradol was sent in which she states was helping AND she has been taking oxycodone from her Csection, however she was having frequency and dysuria as well so we she was informed to make an OV for urine check.  IMPRESSION: 1. Negative for hydronephrosis 2. Small nonshadowing echogenic foci within the left kidney are questionable for small stones.   Blood pressure 120/84, pulse (!) 59, temperature (!) 97.3 F (36.3 C), height 5\' 6"  (1.676 m), weight 173 lb 3.2 oz (78.6 kg), SpO2 97 %, unknown if currently breastfeeding.  Medications Current Outpatient Medications on File Prior to Visit  Medication Sig  . acetaminophen (TYLENOL) 500 MG tablet Take 2 tablets (1,000 mg total) by mouth every 4 (four) hours as needed for mild pain.  Marland Kitchen atenolol (TENORMIN) 50 MG tablet Start 1/2 at night, can increase to 1 pill if needed for BP  . BREO ELLIPTA 100-25 MCG/INH AEPB inhale 1 PUFF into THE lungs ONCE A DAY. RINSE MOUTH WITH WATER AFTER EACH USE  . buPROPion (WELLBUTRIN XL) 150 MG 24 hr tablet TAKE 2 TABLETS BY MOUTH EVERY MORNING  . busPIRone (BUSPAR) 5 MG tablet Take 5 mg by mouth at bedtime.  . Cholecalciferol (VITAMIN D3) 125 MCG (5000 UT) CAPS Take 5,000 Units by mouth at bedtime.  . fluticasone (FLONASE) 50 MCG/ACT nasal spray USE 2 SPRAYS IN EACH NOSTRIL EVERY DAY  . ibuprofen (ADVIL,MOTRIN) 800 MG tablet Take 1 tablet (800 mg total) by mouth 3 (three) times daily.  Marland Kitchen ketorolac (TORADOL) 10 MG tablet Take 1 tablet (10 mg total) by mouth every 6 (six) hours  as needed.  . montelukast (SINGULAIR) 10 MG tablet Take 1 tablet Daily for Allergies  . ondansetron (ZOFRAN-ODT) 4 MG disintegrating tablet DISSOLVE ONE TABLET BY MOUTH 3 TIMES DAILY AS NEEDED  . OVER THE COUNTER MEDICATION Apply 1 application topically at bedtime. Magneium cream(MG-12). Apply to feet at bedtime for restless legs.  . Prenatal Vit-Fe Fumarate-FA (PRENATAL MULTIVITAMIN) TABS tablet Take 1 tablet by mouth at bedtime.  Marland Kitchen PROAIR HFA 108 (90 Base) MCG/ACT inhaler INHALE 1-2 PUFFS EVERY 4 HOURS FOR WHEEZING OR SHORTNESS OF BREATH (Patient taking differently: Inhale 1-2 puffs into the lungs every 4 (four) hours as needed for wheezing or shortness of breath. )  . promethazine-dextromethorphan (PROMETHAZINE-DM) 6.25-15 MG/5ML syrup TAKE 5 mls BY MOUTH 4 TIMES DAILY AS NEEDED FOR COUGH  . senna-docusate (SENOKOT-S) 8.6-50 MG tablet Take 2 tablets by mouth daily.  . simethicone (MYLICON) 80 MG chewable tablet Chew 1 tablet (80 mg total) by mouth as needed for flatulence.  . valACYclovir (VALTREX) 1000 MG tablet TAKE TWO TABLETS BY MOUTH TWICE DAILY  . azelastine (ASTELIN) 0.1 % nasal spray Place 2 sprays into both nostrils 2 (two) times daily. Use in each nostril as directed (Patient taking differently: Place 1 spray into both nostrils 2 (two) times daily as needed for allergies. Use in each nostril as directed)   No current facility-administered medications on file prior to visit.     Problem list  She has Allergy; Anemia; Anxiety; Vitamin D deficiency; Ulnar nerve entrapment, left; Gestational hypertension; and History of dysplastic nevus on their problem list.   Review of Systems  Constitutional: Negative for chills.  HENT: Negative.   Respiratory: Negative.   Cardiovascular: Negative.   Gastrointestinal: Negative.  Negative for nausea and vomiting.  Genitourinary: Positive for dysuria, flank pain and frequency. Negative for decreased urine volume, difficulty urinating, dyspareunia,  enuresis, genital sores, hematuria, menstrual problem, pelvic pain, urgency, vaginal bleeding and vaginal discharge.       Objective:   Physical Exam Constitutional:      Appearance: She is well-developed.  Neck:     Musculoskeletal: Normal range of motion and neck supple.  Cardiovascular:     Rate and Rhythm: Normal rate and regular rhythm.  Pulmonary:     Effort: Pulmonary effort is normal.     Breath sounds: Normal breath sounds.  Abdominal:     General: Bowel sounds are normal. There is no distension.     Palpations: Abdomen is soft. There is no mass.     Tenderness: There is abdominal tenderness. There is left CVA tenderness. There is no guarding or rebound.  Musculoskeletal: Normal range of motion.        General: No tenderness.  Skin:    General: Skin is warm and dry.  Neurological:     Mental Status: She is alert and oriented to person, place, and time.        Assessment & Plan:  Christie Le was seen today for acute visit and diarrhea.  Diagnoses and all orders for this visit:  Dysuria and flank pain Likely kidney stone, not on flomax due to breast feeding Continue pain meds, increase fluids, get on Augmentin pending urine -     CBC with Differential/Platelet -     COMPLETE METABOLIC PANEL WITH GFR -     Urinalysis, Routine w reflex microscopic -     Urine Culture  Other orders -     amoxicillin-clavulanate (AUGMENTIN) 875-125 MG tablet; Take 1 tablet by mouth 2 (two) times daily.  The patient was advised to call immediately if she has any concerning symptoms in the interval. The patient voices understanding of current treatment options and is in agreement with the current care plan.The patient knows to call the clinic with any problems, questions or concerns or go to the ER if any further progression of symptoms.

## 2018-11-15 NOTE — Patient Instructions (Signed)
[facility] Address: [address], [place], [place] [address]  Phone: [phone]  Counseling services I suggest calling your insurance and finding out who is in your network and THEN calling those people or looking them up on google.   I'm a big fan of Cognitive Behavioral Therapy, look this up on You tube or check with the therapist you see if they are certified.  This form of therapy helps to teach you skills to better handle with current situation that are causing anxiety or depression.   There are some great apps too Check out Green, give thanks app.  Meditations apps are great like headspace.    I suggest mood treatment center   Kidney Stones  Kidney stones (urolithiasis) are solid, rock-like deposits that form inside of the organs that make urine (kidneys). A kidney stone may form in a kidney and move into the bladder, where it can cause intense pain and block the flow of urine. Kidney stones are created when high levels of certain minerals are found in the urine. They are usually passed through urination, but in some cases, medical treatment may be needed to remove them. What are the causes? Kidney stones may be caused by:  A condition in which certain glands produce too much parathyroid hormone (primary hyperparathyroidism), which causes too much calcium buildup in the blood.  Buildup of uric acid crystals in the bladder (hyperuricosuria). Uric acid is a chemical that the body produces when you eat certain foods. It usually exits the body in the urine.  Narrowing (stricture) of one or both of the tubes that drain urine from the kidneys to the bladder (ureters).  A kidney blockage that is present at birth (congenital obstruction).  Past surgery on the kidney or the ureters, such as gastric bypass surgery. What increases the risk? The following factors make you more likely to develop kidney stones:  Having had a kidney stone in the past.  Having a family  history of kidney stones.  Not drinking enough water.  Eating a diet that is high in protein, salt (sodium), or sugar.  Being overweight or obese. What are the signs or symptoms? Symptoms of a kidney stone may include:  Nausea.  Vomiting.  Blood in the urine (hematuria).  Pain in the side of the abdomen, right below the ribs (flank pain). Pain usually spreads (radiates) to the groin.  Needing to urinate frequently or urgently. How is this diagnosed? This condition may be diagnosed based on:  Your medical history.  A physical exam.  Blood tests.  Urine tests.  CT scan.  Abdominal X-ray.  A procedure to examine the inside of the bladder (cystoscopy). How is this treated? Treatment for kidney stones depends on the size, location, and makeup of the stones. Treatment may involve:  Analyzing your urine before and after you pass the stone through urination.  Being monitored at the hospital until you pass the stone through urination.  Increasing your fluid intake and decreasing the amount of calcium and protein in your diet.  A procedure to break up kidney stones in the bladder using: ? A focused beam of light (laser therapy). ? Shock waves (extracorporeal shock wave lithotripsy).  Surgery to remove kidney stones. This may be needed if you have severe pain or have stones that block your urinary tract. Follow these instructions at home: Eating and drinking  Drink enough fluid to keep your urine clear or pale yellow. This will help you to pass the kidney stone.  If directed, change your diet. This may include: ? Limiting how much sodium you eat. ? Eating more fruits and vegetables. ? Limiting how much meat, poultry, fish, and eggs you eat.  Follow instructions from your health care provider about eating or drinking restrictions. General instructions  Collect urine samples as told by your health care provider. You may need to collect a urine sample: ? 24 hours  after you pass the stone. ? 8-12 weeks after passing the kidney stone, and every 6-12 months after that.  Strain your urine every time you urinate, for as long as directed. Use the strainer that your health care provider recommends.  Do not throw out the kidney stone after passing it. Keep the stone so it can be tested by your health care provider. Testing the makeup of your kidney stone may help prevent you from getting kidney stones in the future.  Take over-the-counter and prescription medicines only as told by your health care provider.  Keep all follow-up visits as told by your health care provider. This is important. You may need follow-up X-rays or ultrasounds to make sure that your stone has passed. How is this prevented? To prevent another kidney stone:  Drink enough fluid to keep your urine clear or pale yellow. This is the best way to prevent kidney stones.  Eat a healthy diet and follow recommendations from your health care provider about foods to avoid. You may be instructed to eat a low-protein diet. Recommendations vary depending on the type of kidney stone that you have.  Maintain a healthy weight. Contact a health care provider if:  You have pain that gets worse or does not get better with medicine. Get help right away if:  You have a fever or chills.  You develop severe pain.  You develop new abdominal pain.  You faint.  You are unable to urinate. This information is not intended to replace advice given to you by your health care provider. Make sure you discuss any questions you have with your health care provider. Document Released: 04/06/2005 Document Revised: 11/16/2017 Document Reviewed: 09/20/2015 Elsevier Patient Education  2020 [employer].

## 2018-11-17 DIAGNOSIS — R1012 Left upper quadrant pain: Secondary | ICD-10-CM

## 2018-11-17 LAB — URINALYSIS, ROUTINE W REFLEX MICROSCOPIC
Bilirubin Urine: NEGATIVE
Glucose, UA: NEGATIVE
Hgb urine dipstick: NEGATIVE
Ketones, ur: NEGATIVE
Leukocytes,Ua: NEGATIVE
Nitrite: NEGATIVE
Protein, ur: NEGATIVE
Specific Gravity, Urine: 1.028 (ref 1.001–1.03)
pH: <=5 (ref 5.0–8.0)

## 2018-11-17 LAB — COMPLETE METABOLIC PANEL WITH GFR
AG Ratio: 1.6 (calc) (ref 1.0–2.5)
ALT: 11 U/L (ref 6–29)
AST: 16 U/L (ref 10–30)
Albumin: 4.7 g/dL (ref 3.6–5.1)
Alkaline phosphatase (APISO): 76 U/L (ref 31–125)
BUN: 20 mg/dL (ref 7–25)
CO2: 25 mmol/L (ref 20–32)
Calcium: 9.9 mg/dL (ref 8.6–10.2)
Chloride: 105 mmol/L (ref 98–110)
Creat: 0.73 mg/dL (ref 0.50–1.10)
GFR, Est African American: 122 mL/min/{1.73_m2} (ref >=60)
GFR, Est Non African American: 105 mL/min/{1.73_m2} (ref >=60)
Globulin: 3 g/dL (calc) (ref 1.9–3.7)
Glucose, Bld: 95 mg/dL (ref 65–99)
Potassium: 4.3 mmol/L (ref 3.5–5.3)
Sodium: 139 mmol/L (ref 135–146)
Total Bilirubin: 0.5 mg/dL (ref 0.2–1.2)
Total Protein: 7.7 g/dL (ref 6.1–8.1)

## 2018-11-17 LAB — CBC WITH DIFFERENTIAL/PLATELET
Absolute Monocytes: 392 cells/uL (ref 200–950)
Basophils Absolute: 40 cells/uL (ref 0–200)
Basophils Relative: 0.5 %
Eosinophils Absolute: 72 cells/uL (ref 15–500)
Eosinophils Relative: 0.9 %
HCT: 40.3 % (ref 35.0–45.0)
Hemoglobin: 13.5 g/dL (ref 11.7–15.5)
Lymphs Abs: 2272 cells/uL (ref 850–3900)
MCH: 29.3 pg (ref 27.0–33.0)
MCHC: 33.5 g/dL (ref 32.0–36.0)
MCV: 87.6 fL (ref 80.0–100.0)
MPV: 11.8 fL (ref 7.5–12.5)
Monocytes Relative: 4.9 %
Neutro Abs: 5224 cells/uL (ref 1500–7800)
Neutrophils Relative %: 65.3 %
Platelets: 336 10*3/uL (ref 140–400)
RBC: 4.6 10*6/uL (ref 3.80–5.10)
RDW: 12.4 % (ref 11.0–15.0)
Total Lymphocyte: 28.4 %
WBC: 8 10*3/uL (ref 3.8–10.8)

## 2018-11-17 LAB — URINE CULTURE
MICRO NUMBER:: 712142
SPECIMEN QUALITY:: ADEQUATE

## 2018-11-23 ENCOUNTER — Ambulatory Visit
Admission: RE | Admit: 2018-11-23 | Discharge: 2018-11-23 | Disposition: A | Discharge status: Home / Self Care | Payer: 59 | Source: Ambulatory Visit | Attending: Physician Assistant | Admitting: Physician Assistant

## 2018-11-23 DIAGNOSIS — R1012 Left upper quadrant pain: Secondary | ICD-10-CM

## 2018-11-24 ENCOUNTER — Other Ambulatory Visit: Payer: Self-pay | Admitting: Physician Assistant

## 2018-11-24 MED ORDER — AMOXICILLIN-POT CLAVULANATE 875-125 MG PO TABS: 1.0000 | ORAL_TABLET | Freq: Two times a day (BID) | ORAL | 0 refills | Status: AC

## 2018-12-07 ENCOUNTER — Encounter: Payer: Self-pay | Admitting: Physician Assistant

## 2018-12-07 ENCOUNTER — Ambulatory Visit (INDEPENDENT_AMBULATORY_CARE_PROVIDER_SITE_OTHER): Payer: 59 | Admitting: Physician Assistant

## 2018-12-07 ENCOUNTER — Other Ambulatory Visit: Payer: Self-pay

## 2018-12-07 VITALS — BP 128/86 | HR 78 | Temp 97.5°F | Ht 66.0 in | Wt 179.4 lb

## 2018-12-07 DIAGNOSIS — E611 Iron deficiency: Secondary | ICD-10-CM

## 2018-12-07 DIAGNOSIS — R1032 Left lower quadrant pain: Secondary | ICD-10-CM | POA: Diagnosis not present

## 2018-12-07 DIAGNOSIS — R350 Frequency of micturition: Secondary | ICD-10-CM

## 2018-12-07 DIAGNOSIS — E559 Vitamin D deficiency, unspecified: Secondary | ICD-10-CM | POA: Diagnosis not present

## 2018-12-07 DIAGNOSIS — R197 Diarrhea, unspecified: Secondary | ICD-10-CM | POA: Diagnosis not present

## 2018-12-07 MED ORDER — SULFAMETHOXAZOLE-TRIMETHOPRIM 800-160 MG PO TABS: 1.0000 | ORAL_TABLET | Freq: Two times a day (BID) | ORAL | 0 refills | Status: AC

## 2018-12-07 NOTE — Patient Instructions (Addendum)
Please go to the ER if you have any severe AB pain, unable to hold down food/water, blood in stool or vomit, chest pain, shortness of breath, or any worsening symptoms.   Will send stool sample to do if diarrhea returns  Suggest gluten free diet,try it and read below  If diarrhea is severe - 4+ watery diarrhea episodes, take imodium to reduce fluid loss, and start on electrolyte supplemented fluids.   Please go to the ER if you have any severe AB pain, unable to hold down food/water, blood in stool or vomit, chest pain, shortness of breath, or any worsening symptoms.   Consider keeping a food diary- common causes of diarrhea are dairy, certain carbs...  FODMAP stands for fermentable oligo-, di-, mono-saccharides and polyols (1). These are the scientific terms used to classify groups of carbs that are notorious for triggering digestive symptoms like bloating, gas and stomach pain.   FODMAPs are found in a wide range of foods in varying amounts. Some foods contain just one type, while others contain several.  The main dietary sources of the four groups of FODMAPs include:  Oligosaccharides: Wheat, rye, legumes and various fruits and vegetables, such as garlic and onions.  Disaccharides: Milk, yogurt and soft cheese. Lactose is the main carb.  Monosaccharides: Various fruit including figs and mangoes, and sweeteners such as honey and agave nectar. Fructose is the main carb.  Polyols: Certain fruits and vegetables including blackberries and lychee, as well as some low-calorie sweeteners like those in sugar-free gum.   Keep a food diary. This will help you identify foods that cause symptoms. Write down: ? What you eat and when. ? What symptoms you have. ? When symptoms occur in relation to your meals.  Avoid foods that cause symptoms. Talk with your dietitian about other ways to get the same nutrients that are in these foods.  Eat your meals slowly, in a relaxed setting.  Aim to eat  5-6 small meals per day. Do not skip meals.  Drink enough fluids to keep your urine clear or pale yellow.  Ask your health care provider if you should take an over-the-counter probiotic during flare-ups to help restore healthy gut bacteria.  If you have cramping or diarrhea, try making your meals low in fat and high in carbohydrates. Examples of carbohydrates are pasta, rice, whole grain breads and cereals, fruits, and vegetables.  If dairy products cause your symptoms to flare up, try eating less of them. You might be able to handle yogurt better than other dairy products because it contains bacteria that help with digestion.

## 2018-12-07 NOTE — Progress Notes (Signed)
Subjective:    Patient ID: Christie Le, female    DOB: 1980/10/28, 38 y.o.   MRN: [id number]  HPI 38 y.o. WF presents with diarrhea and AB cramping.  She was seen on 07/28 with left flank pain, had history of kidney stone and thought to be the same. Given Augmentin for possible UTI, patient is breast feeding. She had CT scan AB without contrast that showed no kidney stone, and was normal.  She states while on the augmentin her AB pain, diarrhea had improved, she continued to have urinary frequency.   When she came off of the augmentin, she started to have diarrhea, severe nausea and AB cramping. Diarrhea every day, 2-5 times a day, large volume, worst after foods. Will have lower AB cramping with sweating. Slight fever Sunday 100.9.  Nails a week and 1/2 ago.  Then yesterday after dinner she had straining with a BM and had pellets and felt like constipation.  No new medications. She is still breast feeding.  Patient denies blood in stool, illness in household contacts, recent travel, unintentional weight loss.  Patient denies daycare exposure, exposure to illness, hospitalization, unusual food and untreated water.  Weight is up 5 lbs from last visit.    Wt Readings from Last 3 Encounters:  12/07/18 179 lb 6.4 oz (81.4 kg)  11/15/18 173 lb 3.2 oz (78.6 kg)  09/19/18 171 lb (77.6 kg)    Blood pressure 128/86, pulse 78, temperature (!) 97.5 F (36.4 C), height 5\' 6"  (1.676 m), weight 179 lb 6.4 oz (81.4 kg), SpO2 97 %, unknown if currently breastfeeding.  Medications Current Outpatient Medications on File Prior to Visit  Medication Sig  . acetaminophen (TYLENOL) 500 MG tablet Take 2 tablets (1,000 mg total) by mouth every 4 (four) hours as needed for mild pain.  Marland Kitchen atenolol (TENORMIN) 50 MG tablet Start 1/2 at night, can increase to 1 pill if needed for BP  . BREO ELLIPTA 100-25 MCG/INH AEPB inhale 1 PUFF into THE lungs ONCE A DAY. RINSE MOUTH WITH WATER AFTER EACH USE  . buPROPion  (WELLBUTRIN XL) 150 MG 24 hr tablet TAKE 2 TABLETS BY MOUTH EVERY MORNING  . busPIRone (BUSPAR) 5 MG tablet Take 5 mg by mouth at bedtime.  . Cholecalciferol (VITAMIN D3) 125 MCG (5000 UT) CAPS Take 5,000 Units by mouth at bedtime.  . fluticasone (FLONASE) 50 MCG/ACT nasal spray USE 2 SPRAYS IN EACH NOSTRIL EVERY DAY  . ibuprofen (ADVIL,MOTRIN) 800 MG tablet Take 1 tablet (800 mg total) by mouth 3 (three) times daily.  Marland Kitchen ketorolac (TORADOL) 10 MG tablet Take 1 tablet (10 mg total) by mouth every 6 (six) hours as needed.  . montelukast (SINGULAIR) 10 MG tablet Take 1 tablet Daily for Allergies  . ondansetron (ZOFRAN-ODT) 4 MG disintegrating tablet DISSOLVE ONE TABLET BY MOUTH 3 TIMES DAILY AS NEEDED  . OVER THE COUNTER MEDICATION Apply 1 application topically at bedtime. Magneium cream(MG-12). Apply to feet at bedtime for restless legs.  . Prenatal Vit-Fe Fumarate-FA (PRENATAL MULTIVITAMIN) TABS tablet Take 1 tablet by mouth at bedtime.  Marland Kitchen PROAIR HFA 108 (90 Base) MCG/ACT inhaler INHALE 1-2 PUFFS EVERY 4 HOURS FOR WHEEZING OR SHORTNESS OF BREATH (Patient taking differently: Inhale 1-2 puffs into the lungs every 4 (four) hours as needed for wheezing or shortness of breath. )  . promethazine-dextromethorphan (PROMETHAZINE-DM) 6.25-15 MG/5ML syrup TAKE 5 mls BY MOUTH 4 TIMES DAILY AS NEEDED FOR COUGH  . senna-docusate (SENOKOT-S) 8.6-50 MG tablet Take 2  tablets by mouth daily.  . simethicone (MYLICON) 80 MG chewable tablet Chew 1 tablet (80 mg total) by mouth as needed for flatulence.  . valACYclovir (VALTREX) 1000 MG tablet TAKE TWO TABLETS BY MOUTH TWICE DAILY  . azelastine (ASTELIN) 0.1 % nasal spray Place 2 sprays into both nostrils 2 (two) times daily. Use in each nostril as directed (Patient taking differently: Place 1 spray into both nostrils 2 (two) times daily as needed for allergies. Use in each nostril as directed)   No current facility-administered medications on file prior to visit.      Problem list She has Allergy; Anemia; Anxiety; Vitamin D deficiency; Ulnar nerve entrapment, left; Gestational hypertension; and History of dysplastic nevus on their problem list.   Review of Systems  Constitutional: Positive for fatigue. Negative for chills, diaphoresis and fever.  HENT: Negative.   Respiratory: Negative.  Negative for cough.   Cardiovascular: Negative.   Gastrointestinal: Positive for abdominal pain, constipation, diarrhea and nausea. Negative for abdominal distention, anal bleeding, blood in stool, rectal pain and vomiting.  Genitourinary: Negative.   Musculoskeletal: Negative for arthralgias, back pain, gait problem, joint swelling, myalgias, neck pain and neck stiffness.  Skin: Negative.   Neurological: Negative for dizziness and headaches.       Objective:   Physical Exam Vitals signs reviewed.  Constitutional:      Appearance: She is well-developed.  Neck:     Musculoskeletal: Normal range of motion and neck supple.  Cardiovascular:     Rate and Rhythm: Normal rate and regular rhythm.  Pulmonary:     Effort: Pulmonary effort is normal.     Breath sounds: Normal breath sounds.  Abdominal:     General: Bowel sounds are normal. There is no distension.     Palpations: Abdomen is soft. There is no mass.     Tenderness: There is abdominal tenderness in the left lower quadrant. There is guarding. There is no right CVA tenderness, left CVA tenderness or rebound. Positive signs include Rovsing's sign (She has left lower quadrant pain with palpation of right lower quadrant. NO REBOUND). Negative signs include Murphy's sign and McBurney's sign.  Skin:    General: Skin is warm and dry.     Findings: No rash.  Neurological:     Mental Status: She is alert and oriented to person, place, and time.        Assessment & Plan:    Diarrhea of presumed infectious origin -     TSH -     Gastrointestinal Pathogen Panel PCR; Future -     CT ABDOMEN PELVIS W  CONTRAST; Future  Left lower quadrant abdominal pain -     COMPLETE METABOLIC PANEL WITH GFR -     CT ABDOMEN PELVIS W CONTRAST; Future -     sulfamethoxazole-trimethoprim (BACTRIM DS) 800-160 MG tablet; Take 1 tablet by mouth 2 (two) times daily for 7 days.  Urinary frequency -     Urinalysis, Routine w reflex microscopic -     Urine Culture  Vitamin D deficiency -     VITAMIN D 25 Hydroxy (Vit-D Deficiency, Fractures)  Iron deficiency -     Iron,Total/Total Iron Binding Cap -     Ferritin  Please go to the ER if you have any severe AB pain, unable to hold down food/water, blood in stool or vomit, chest pain, shortness of breath, or any worsening symptoms.

## 2018-12-08 LAB — URINE CULTURE
MICRO NUMBER:: 789365
SPECIMEN QUALITY:: ADEQUATE

## 2018-12-08 LAB — FERRITIN: Ferritin: 31 ng/mL (ref 16–154)

## 2018-12-08 LAB — URINALYSIS, ROUTINE W REFLEX MICROSCOPIC
Bilirubin Urine: NEGATIVE
Glucose, UA: NEGATIVE
Hgb urine dipstick: NEGATIVE
Ketones, ur: NEGATIVE
Leukocytes,Ua: NEGATIVE
Nitrite: NEGATIVE
Protein, ur: NEGATIVE
Specific Gravity, Urine: 1.022 (ref 1.001–1.03)
pH: <=5 (ref 5.0–8.0)

## 2018-12-08 LAB — COMPLETE METABOLIC PANEL WITH GFR
AG Ratio: 1.7 (calc) (ref 1.0–2.5)
ALT: 11 U/L (ref 6–29)
AST: 15 U/L (ref 10–30)
Albumin: 4.4 g/dL (ref 3.6–5.1)
Alkaline phosphatase (APISO): 84 U/L (ref 31–125)
BUN: 13 mg/dL (ref 7–25)
CO2: 21 mmol/L (ref 20–32)
Calcium: 9.5 mg/dL (ref 8.6–10.2)
Chloride: 105 mmol/L (ref 98–110)
Creat: 0.65 mg/dL (ref 0.50–1.10)
GFR, Est African American: 131 mL/min/{1.73_m2} (ref >=60)
GFR, Est Non African American: 113 mL/min/{1.73_m2} (ref >=60)
Globulin: 2.6 g/dL (calc) (ref 1.9–3.7)
Glucose, Bld: 85 mg/dL (ref 65–99)
Potassium: 4.1 mmol/L (ref 3.5–5.3)
Sodium: 139 mmol/L (ref 135–146)
Total Bilirubin: 0.4 mg/dL (ref 0.2–1.2)
Total Protein: 7 g/dL (ref 6.1–8.1)

## 2018-12-08 LAB — VITAMIN D 25 HYDROXY (VIT D DEFICIENCY, FRACTURES): Vit D, 25-Hydroxy: 54 ng/mL (ref 30–100)

## 2018-12-08 LAB — IRON, TOTAL/TOTAL IRON BINDING CAP
%SAT: 18 % (calc) (ref 16–45)
Iron: 59 ug/dL (ref 40–190)
TIBC: 332 mcg/dL (calc) (ref 250–450)

## 2018-12-08 LAB — TSH: TSH: 1.39 mIU/L

## 2018-12-16 ENCOUNTER — Ambulatory Visit
Admission: RE | Admit: 2018-12-16 | Discharge: 2018-12-16 | Disposition: A | Discharge status: Home / Self Care | Payer: 59 | Source: Ambulatory Visit | Attending: Physician Assistant | Admitting: Physician Assistant

## 2018-12-16 ENCOUNTER — Other Ambulatory Visit: Payer: Self-pay

## 2018-12-16 DIAGNOSIS — R1032 Left lower quadrant pain: Secondary | ICD-10-CM

## 2018-12-16 DIAGNOSIS — R197 Diarrhea, unspecified: Secondary | ICD-10-CM

## 2018-12-16 MED ORDER — IOPAMIDOL (ISOVUE-300) INJECTION 61%
100.0000 mL | Freq: Once | INTRAVENOUS | Status: AC | PRN
  Administered 2018-12-16: 12:00:00 100 mL via INTRAVENOUS

## 2018-12-19 NOTE — Progress Notes (Signed)
Complete Physical  Assessment and Plan:  Screening cholesterol level -     Lipid panel  Sleep apnea, unspecified type -     Ambulatory referral to Sleep Studies - will send for sleep study, she has gasping in the night, fatigue int he morning, will refer to rule out sleep apnea  Insomnia, unspecified type -     traZODone (DESYREL) 50 MG tablet; 1/2-1 tablet for sleep -     Ambulatory referral to Sleep Studies  Vitamin D deficiency -     continue  Anxiety Due to COVID, new baby with the twins and mother in law.  She is on the xanax VERY rarely  Anemia, unspecified type Check recently  Allergic state, subsequent encounter Continue over the counter  BMI 26.0-26.9,adult  Overweight  - long discussion about weight loss, diet, and exercise -recommended diet heavy in fruits and veggies and low in animal meats, cheeses, and dairy products  She had a lot of labs recently Discussed med's effects and SE's. Screening labs and tests as requested with regular follow-up as recommended.  HPI  38 y.o. female  presents for a complete physical.  She recently saw derm for area on her left breast, had removal in June and had basal cell carcinoma. Did internal sutures and states she feels a small piece. Has follow up next week.   Has 37 year old twin girls Ovid Curd and Multimedia programmer, at [employer] school but had a case and now they are remote with some increased stress and tension with husband's mom, remarried [place]. Has new baby, Mckinsie. Grandmother passed, was her emotional support, doing hospice counseling.  Having trouble sleeping, has had sleep issues, she also wakes up tired, wakes up in the middle of the night gas/ping for air. Has been worse with pregnancy.   Her blood pressure has been controlled at home, today their BP is BP: 118/72 She does workout, 3-4 times a week. She denies chest pain, shortness of breath, dizziness.  She is not on cholesterol medication and denies myalgias. Her  cholesterol is at goal. The cholesterol last visit was:   Lab Results  Component Value Date   CHOL 166 07/09/2017   HDL 49 (L) 07/09/2017   LDLCALC 103 (H) 07/09/2017   TRIG 54 07/09/2017   CHOLHDL 3.4 07/09/2017   Last A1C in the office was:  Lab Results  Component Value Date   HGBA1C 5.4 06/28/2015  Patient is on Vitamin D supplement, not on it at this time, on prenatal.    Lab Results  Component Value Date   VD25OH 54 12/07/2018   Asthma well controlled, uses albuterol inhaler rarely  BMI is Body mass index is 28.73 kg/m., she is working on diet and exercise. Wt Readings from Last 3 Encounters:  12/21/18 178 lb (80.7 kg)  12/07/18 179 lb 6.4 oz (81.4 kg)  11/15/18 173 lb 3.2 oz (78.6 kg)    Current Medications:  Current Outpatient Medications on File Prior to Visit  Medication Sig Dispense Refill  . acetaminophen (TYLENOL) 500 MG tablet Take 2 tablets (1,000 mg total) by mouth every 4 (four) hours as needed for mild pain.    Marland Kitchen atenolol (TENORMIN) 50 MG tablet Start 1/2 at night, can increase to 1 pill if needed for BP 90 tablet 3  . BREO ELLIPTA 100-25 MCG/INH AEPB inhale 1 PUFF into THE lungs ONCE A DAY. RINSE MOUTH WITH WATER AFTER EACH USE 60 each 0  . buPROPion (WELLBUTRIN XL) 150 MG 24  hr tablet TAKE 2 TABLETS BY MOUTH EVERY MORNING 60 tablet 2  . busPIRone (BUSPAR) 5 MG tablet Take 5 mg by mouth at bedtime.    . Cholecalciferol (VITAMIN D3) 125 MCG (5000 UT) CAPS Take 5,000 Units by mouth at bedtime.    . fluticasone (FLONASE) 50 MCG/ACT nasal spray USE 2 SPRAYS IN EACH NOSTRIL EVERY DAY 48 g 3  . ibuprofen (ADVIL,MOTRIN) 800 MG tablet Take 1 tablet (800 mg total) by mouth 3 (three) times daily. 30 tablet 0  . ketorolac (TORADOL) 10 MG tablet Take 1 tablet (10 mg total) by mouth every 6 (six) hours as needed. 20 tablet 0  . montelukast (SINGULAIR) 10 MG tablet Take 1 tablet Daily for Allergies 90 tablet 3  . ondansetron (ZOFRAN-ODT) 4 MG disintegrating tablet DISSOLVE  ONE TABLET BY MOUTH 3 TIMES DAILY AS NEEDED 24 tablet 0  . OVER THE COUNTER MEDICATION Apply 1 application topically at bedtime. Magneium cream(MG-12). Apply to feet at bedtime for restless legs.    . Prenatal Vit-Fe Fumarate-FA (PRENATAL MULTIVITAMIN) TABS tablet Take 1 tablet by mouth at bedtime.    Marland Kitchen PROAIR HFA 108 (90 Base) MCG/ACT inhaler INHALE 1-2 PUFFS EVERY 4 HOURS FOR WHEEZING OR SHORTNESS OF BREATH (Patient taking differently: Inhale 1-2 puffs into the lungs every 4 (four) hours as needed for wheezing or shortness of breath. ) 17 g 2  . promethazine-dextromethorphan (PROMETHAZINE-DM) 6.25-15 MG/5ML syrup TAKE 5 mls BY MOUTH 4 TIMES DAILY AS NEEDED FOR COUGH 240 mL 1  . senna-docusate (SENOKOT-S) 8.6-50 MG tablet Take 2 tablets by mouth daily.    . simethicone (MYLICON) 80 MG chewable tablet Chew 1 tablet (80 mg total) by mouth as needed for flatulence. 30 tablet 0  . valACYclovir (VALTREX) 1000 MG tablet TAKE TWO TABLETS BY MOUTH TWICE DAILY 360 tablet 2  . azelastine (ASTELIN) 0.1 % nasal spray Place 2 sprays into both nostrils 2 (two) times daily. Use in each nostril as directed (Patient taking differently: Place 1 spray into both nostrils 2 (two) times daily as needed for allergies. Use in each nostril as directed) 30 mL 2   No current facility-administered medications on file prior to visit.    Health Maintenance:   Immunization History  Administered Date(s) Administered  . Influenza-Unspecified 11/21/2012  . Tdap 05/10/2012   Tetanus: 04/2012 Pneumovax: N/A Flu vaccine: 2017 Zostavax: N/A  Pap:Dr. Lisbeth Renshaw- Mirena Oct 2018, abnormal pap- colposcopy normal.   MGM: N/A DEXA: N/A Colonoscopy: N/A EGD: N.A  Last Dental Exam: Dr. Aida Puffer Last Eye Exam: None, has been to [facility] in the past  Patient Care Team: Unk Pinto, MD as PCP - General (Internal Medicine)  Medical History:  Past Medical History:  Diagnosis Date  . Anemia    hx of  . Asthma    no  problems since pregnancy  . Breech presentation 04/26/2018  . Family history of anesthesia complication    "mom is sensitive- severe n/v"  . Fibrocystic breast   . Pneumonia    age 63  . PONV (postoperative nausea and vomiting)   . Postpartum care following cesarean delivery (1/8) 04/27/2018  . Pregnancy induced hypertension   . Previous cesarean section 04/26/2018  . S/P laparoscopic cholecystectomy June 15 09/25/2013   Single small stone; chronic changes;  Small bile ducts   . Seasonal allergies   . Vitamin D deficiency    Allergies No Known Allergies  SURGICAL HISTORY She  has a past surgical history that includes Cesarean  section (07/29/2009); Wisdom tooth extraction (2002); Cholecystectomy (N/A, 09/25/2013); and Cesarean section (N/A, 04/26/2018). FAMILY HISTORY Her family history includes COPD in her maternal grandmother; Cancer in her father and maternal grandmother; Diabetes in her father; Heart disease in her maternal grandmother; Hypertension in her mother and another family member; Skin cancer (age of onset: 57) in her mother. SOCIAL HISTORY She  reports that she has never smoked. She has never used smokeless tobacco. She reports previous alcohol use. She reports that she does not use drugs.  Review of Systems: Review of Systems  Constitutional: Positive for malaise/fatigue. Negative for chills, diaphoresis, fever and weight loss.  HENT: Negative for congestion, ear discharge, ear pain, hearing loss, nosebleeds, sore throat and tinnitus.   Eyes: Negative.   Respiratory: Negative.  Negative for stridor.   Cardiovascular: Negative.   Gastrointestinal: Negative for abdominal pain, blood in stool, constipation, diarrhea, heartburn, melena, nausea and vomiting.  Genitourinary: Negative.   Musculoskeletal: Negative.   Skin: Negative.   Neurological: Negative.  Negative for weakness and headaches.  Endo/Heme/Allergies: Negative.   Psychiatric/Behavioral: Negative.     Physical  Exam: Estimated body mass index is 28.73 kg/m as calculated from the following:   Height as of this encounter: 5\' 6"  (1.676 m).   Weight as of this encounter: 178 lb (80.7 kg). BP 118/72   Pulse 73   Temp (!) 97.5 F (36.4 C)   Ht 5\' 6"  (1.676 m)   Wt 178 lb (80.7 kg)   SpO2 98%   BMI 28.73 kg/m  General Appearance: Well nourished, in no apparent distress.  Eyes: PERRLA, EOMs, conjunctiva no swelling or erythema, normal fundi and vessels.  Sinuses: No Frontal/maxillary tenderness  ENT/Mouth: Ext aud canals clear, normal light reflex with TMs without erythema, bulging. Good dentition. No erythema, swelling, or exudate on post pharynx. Tonsils + swelling with tonsil stone on left side. Hearing normal.  Neck: Supple, thyroid normal. No bruits  Respiratory: Respiratory effort normal, BS equal bilaterally without rales, rhonchi, wheezing or stridor.  Cardio: RRR without murmurs, rubs or gallops. Brisk peripheral pulses without edema.  Chest: symmetric, with normal excursions and percussion.  Breasts: defer OB/GYN Abdomen: Soft, nontender, no guarding, rebound, hernias, masses, or organomegaly. .  Lymphatics: Non tender without lymphadenopathy.  Genitourinary: defer OB/GYN Musculoskeletal: Full ROM all peripheral extremities,5/5 strength, and normal gait.  Skin: Warm, dry without rashes, lesions, ecchymosis. Neuro: Cranial nerves intact, reflexes equal bilaterally. Normal muscle tone, no cerebellar symptoms. Sensation intact.  Psych: Awake and oriented X 3, normal affect, Insight and Judgment appropriate.   EKG: defer  Vicie Mutters 9:06 AM [facility]

## 2018-12-21 ENCOUNTER — Other Ambulatory Visit: Payer: Self-pay

## 2018-12-21 ENCOUNTER — Ambulatory Visit (INDEPENDENT_AMBULATORY_CARE_PROVIDER_SITE_OTHER): Payer: 59 | Admitting: Physician Assistant

## 2018-12-21 VITALS — BP 118/72 | HR 73 | Temp 97.5°F | Ht 66.0 in | Wt 178.0 lb

## 2018-12-21 DIAGNOSIS — T7840XD Allergy, unspecified, subsequent encounter: Secondary | ICD-10-CM

## 2018-12-21 DIAGNOSIS — E559 Vitamin D deficiency, unspecified: Secondary | ICD-10-CM

## 2018-12-21 DIAGNOSIS — Z0000 Encounter for general adult medical examination without abnormal findings: Secondary | ICD-10-CM | POA: Diagnosis not present

## 2018-12-21 DIAGNOSIS — D649 Anemia, unspecified: Secondary | ICD-10-CM

## 2018-12-21 DIAGNOSIS — F419 Anxiety disorder, unspecified: Secondary | ICD-10-CM

## 2018-12-21 DIAGNOSIS — G4700 Insomnia, unspecified: Secondary | ICD-10-CM

## 2018-12-21 DIAGNOSIS — Z13220 Encounter for screening for lipoid disorders: Secondary | ICD-10-CM

## 2018-12-21 DIAGNOSIS — Z79899 Other long term (current) drug therapy: Secondary | ICD-10-CM

## 2018-12-21 DIAGNOSIS — Z86018 Personal history of other benign neoplasm: Secondary | ICD-10-CM

## 2018-12-21 DIAGNOSIS — G4730 Sleep apnea, unspecified: Secondary | ICD-10-CM

## 2018-12-21 DIAGNOSIS — Z0001 Encounter for general adult medical examination with abnormal findings: Secondary | ICD-10-CM

## 2018-12-21 DIAGNOSIS — O139 Gestational [pregnancy-induced] hypertension without significant proteinuria, unspecified trimester: Secondary | ICD-10-CM

## 2018-12-21 DIAGNOSIS — D509 Iron deficiency anemia, unspecified: Secondary | ICD-10-CM

## 2018-12-21 MED ORDER — TRAZODONE HCL 50 MG PO TABS: ORAL_TABLET | ORAL | 2 refills | Status: DC

## 2018-12-21 NOTE — Patient Instructions (Signed)
TRAZODONE  Try the trazodone This is not habit forming, you will not build a tolerance to it Please start out at 25mg  or 1/2 pill, can increase to 50mg  at night, and the max of this medication is 150mg  at night.   Side effects can be odd dreams, dry mouth, and drowsiness.   11 Tips to Follow:  1. No caffeine after 3pm: Avoid beverages with caffeine (soda, tea, energy drinks, etc.) especially after 3pm. 2. Don't go to bed hungry: Have your evening meal at least 3 hrs. before going to sleep. It's fine to have a small bedtime snack such as a glass of milk and a few crackers but don't have a big meal. 3. Have a nightly routine before bed: Plan on "winding down" before you go to sleep. Begin relaxing about 1 hour before you go to bed. Try doing a quiet activity such as listening to calming music, reading a book or meditating. 4. Turn off the TV and ALL electronics including video games, tablets, laptops, etc. 1 hour before sleep, and keep them out of the bedroom. 5. Turn off your cell phone and all notifications (new email and text alerts) or even better, leave your phone outside your room while you sleep. Studies have shown that a part of your brain continues to respond to certain lights and sounds even while you're still asleep. 6. Make your bedroom quiet, dark and cool. If you can't control the noise, try wearing earplugs or using a fan to block out other sounds. 7. Practice relaxation techniques. Try reading a book or meditating or drain your brain by writing a list of what you need to do the next day. 8. Don't nap unless you feel sick: you'll have a better night's sleep. 9. Don't smoke, or quit if you do. Nicotine, alcohol, and marijuana can all keep you awake. Talk to your health care provider if you need help with substance use. 10. Most importantly, wake up at the same time every day (or within 1 hour of your usual wake up time) EVEN on the weekends. A regular wake up time promotes sleep hygiene  and prevents sleep problems. 11. Reduce exposure to bright light in the last three hours of the day before going to sleep. Maintaining good sleep hygiene and having good sleep habits lower your risk of developing sleep problems. Getting better sleep can also improve your concentration and alertness. Try the simple steps in this guide. If you still have trouble getting enough rest, make an appointment with your health care provider.

## 2018-12-22 LAB — LIPID PANEL
Cholesterol: 198 mg/dL (ref <200)
HDL: 46 mg/dL — ABNORMAL LOW (ref >=50)
LDL Cholesterol (Calc): 137 mg/dL (calc) — ABNORMAL HIGH
Non-HDL Cholesterol (Calc): 152 mg/dL (calc) — ABNORMAL HIGH (ref <130)
Total CHOL/HDL Ratio: 4.3 (calc) (ref <5.0)
Triglycerides: 61 mg/dL (ref <150)

## 2019-01-04 ENCOUNTER — Encounter: Payer: Self-pay | Admitting: Neurology

## 2019-01-04 ENCOUNTER — Other Ambulatory Visit: Payer: Self-pay

## 2019-01-04 ENCOUNTER — Ambulatory Visit (INDEPENDENT_AMBULATORY_CARE_PROVIDER_SITE_OTHER): Payer: 59 | Admitting: Neurology

## 2019-01-04 VITALS — BP 127/80 | HR 65 | Temp 97.1°F | Ht 66.0 in | Wt 180.0 lb

## 2019-01-04 DIAGNOSIS — O133 Gestational [pregnancy-induced] hypertension without significant proteinuria, third trimester: Secondary | ICD-10-CM

## 2019-01-04 DIAGNOSIS — G478 Other sleep disorders: Secondary | ICD-10-CM

## 2019-01-04 NOTE — Progress Notes (Signed)
SLEEP MEDICINE CLINIC    Provider:  Larey Seat, MD  Primary Care Physician:  Unk Pinto, MD [address] [place] [address]     Referring Provider: Unk Pinto, [address] [place],  [place] [address]          Chief Complaint according to patient   Patient presents with:    . New Patient (Initial Visit)           HISTORY OF PRESENT ILLNESS:  Christie Le is a 38 y.o. year old White or Caucasian female patient seen here as a referral on 01/04/2019 from Dr Melford Aase.   Chief concern according to patient : RM 10. Patient works in [employer] that she may have had sleep apnea present. states that when she was pregnant the waking up gasping for air increased, still does that. they ? insomnia and started treatment for that. never had SS and intermittent snoring present. has a 37 month old daughter that came a little early- January 8, 37 weeks, emergency caesarian due to HTN.    I have the pleasure of seeing Christie Le today, a right -handed White or Caucasian female with a possible sleep disorder. She   has a past medical history of Anemia, Asthma, Breech presentation (04/26/2018), Family history of anesthesia complication, Fibrocystic breast, Pneumonia, PONV (postoperative nausea and vomiting), Postpartum care following cesarean delivery (1/8) (04/27/2018), Pregnancy induced hypertension, Previous cesarean section (04/26/2018), S/P laparoscopic cholecystectomy June 15 (09/25/2013), Seasonal allergies, and Vitamin D deficiency..    The patient never had a sleep study .    Sleep relevant medical history: No Nocturia/ Enuresis , no history of Sleep walking, Night terrors, other Parasomnia .mononucleosis at age 76.  Tonsillectomy; no , no cervical spine trauma surgery, no  deviated septum repair/UPPP. Family medical /sleep history: No other family member on CPAP with OSA, grandmother may have had OSA.   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   Family status is married, and has grandmother for childcare.  The patient currently works afternoons.  Pets are  Present. 2 dogs and a lizard.  Tobacco use.never .  ETOH use; wine/ 1-2 a week. , Caffeine intake in form of Coffee( 1 in AM)  Soda( 1 a day) . Regular exercise in form of walking  -" heaviest I have ever been "     Sleep habits are as follows: The patient's dinner time is between 7.30 PM. The patient goes to bed at 9 PM and is asleep by 9.30 PM- baby may wake her- otherwise continues to sleep for 3-4  Hours at a time, wakes for breastfeeding,  not bathroom breaks.   The preferred sleep position is lateral , with the support of 1 pillow. Dreams are reportedly frequent/vivid.Marland Kitchen  5.30  AM is the usual rise time. The patient wakes up at 5 with an alarm.  She reports not feeling refreshed or restored in AM, with symptoms such as dry mouth , dew  morning headaches , and  Nasal congestion. Lot's of residual fatigue.  Naps are taken infrequently, she has trouble to nap with the children around. Naps lasting from 60 to 120 minutes and are less refreshing than nocturnal sleep.    Review of Systems: Out of a complete 14 system review, the patient complains of  only the following symptoms, and all other reviewed systems are negative.:  Fatigue, sleepiness , some snoring, more fragmented sleep, Insomnia due to baby- senses are on hypervigilance . RLS during pregnancy- treated with magnesium, had ecclamsia  Gestational and post partum- hypertension.  Dizziness, now on betablocker.    How likely are you to doze in the following situations: 0 = not likely, 1 = slight chance, 2 = moderate chance, 3 = high chance   Sitting and Reading? Watching Television? Sitting inactive in a public place (theater or meeting)? As a passenger in a car for an  hour without a break? Lying down in the afternoon when circumstances permit? Sitting and talking to someone? Sitting quietly after lunch without alcohol? In a car, while stopped for a few minutes in traffic?   Total = 10/ 24 points   FSS endorsed at 45/ 63 points.   Social History   Socioeconomic History  . Marital status: Married    Spouse name: Not on file  . Number of children: Not on file  . Years of education: Not on file  . Highest education level: Not on file  Occupational History  . Not on file  Social Needs  . Financial resource strain: Not hard at all  . Food insecurity    Worry: Never true    Inability: Never true  . Transportation needs    Medical: No    Non-medical: Not on file  Tobacco Use  . Smoking status: Never Smoker  . Smokeless tobacco: Never Used  Substance and Sexual Activity  . Alcohol use: Yes    Alcohol/week: 1.0 - 2.0 standard drinks    Types: 1 - 2 Glasses of wine per week    Comment: rare  . Drug use: No  . Sexual activity: Yes  Lifestyle  . Physical activity    Days per week: Not on file    Minutes per session: Not on file  . Stress: To some extent  Relationships  . Social Herbalist on phone: Not on file    Gets together: Not on file    Attends religious service: Not on file    Active member of club or organization: Not on file    Attends meetings of clubs or organizations: Not on file    Relationship status: Not on file  Other Topics Concern  . Not on file  Social History Narrative  . Not on file    Family History  Problem Relation Age of Onset  . Hypertension Mother   . Skin cancer Mother 70       not melanoma  . Diabetes Father   . Cancer Father        prostate  . Hypertension Other   . Cancer Maternal Grandmother   . COPD Maternal Grandmother   . Heart disease Maternal Grandmother     Past Medical History:  Diagnosis Date  . Anemia    hx of  . Asthma    no problems since pregnancy  . Breech  presentation 04/26/2018  . Family history of anesthesia complication    "mom is sensitive- severe n/v"  . Fibrocystic breast   . Pneumonia    age 78  . PONV (postoperative nausea and vomiting)   . Postpartum care following cesarean delivery (1/8) 04/27/2018  . Pregnancy induced hypertension   . Previous cesarean section 04/26/2018  . S/P laparoscopic cholecystectomy June 15 09/25/2013   Single small stone; chronic changes;  Small bile  ducts   . Seasonal allergies   . Vitamin D deficiency     Past Surgical History:  Procedure Laterality Date  . CESAREAN SECTION  07/29/2009  . CESAREAN SECTION N/A 04/26/2018   Procedure: Repeat CESAREAN SECTION;  Surgeon: Servando Salina, MD;  Location: [facility];  Service: Obstetrics;  Laterality: N/A;  EDD: 05/16/18  . CHOLECYSTECTOMY N/A 09/25/2013   Procedure: LAPAROSCOPIC CHOLECYSTECTOMY WITH INTRAOPERATIVE CHOLANGIOGRAM;  Surgeon: Pedro Earls, MD;  Location: [facility];  Service: General;  Laterality: N/A;  . WISDOM TOOTH EXTRACTION  2002     Current Outpatient Medications on File Prior to Visit  Medication Sig Dispense Refill  . acetaminophen (TYLENOL) 500 MG tablet Take 2 tablets (1,000 mg total) by mouth every 4 (four) hours as needed for mild pain.    Marland Kitchen atenolol (TENORMIN) 50 MG tablet Start 1/2 at night, can increase to 1 pill if needed for BP 90 tablet 3  . BREO ELLIPTA 100-25 MCG/INH AEPB inhale 1 PUFF into THE lungs ONCE A DAY. RINSE MOUTH WITH WATER AFTER EACH USE 60 each 0  . buPROPion (WELLBUTRIN XL) 150 MG 24 hr tablet TAKE 2 TABLETS BY MOUTH EVERY MORNING 60 tablet 2  . Cholecalciferol (VITAMIN D3) 125 MCG (5000 UT) CAPS Take 5,000 Units by mouth at bedtime.    . fluticasone (FLONASE) 50 MCG/ACT nasal spray USE 2 SPRAYS IN EACH NOSTRIL EVERY DAY 48 g 3  . ibuprofen (ADVIL,MOTRIN) 800 MG tablet Take 1 tablet (800 mg total) by mouth 3 (three) times daily. (Patient taking differently: Take 800 mg by mouth 3 (three) times daily as  needed. ) 30 tablet 0  . montelukast (SINGULAIR) 10 MG tablet Take 1 tablet Daily for Allergies 90 tablet 3  . ondansetron (ZOFRAN-ODT) 4 MG disintegrating tablet DISSOLVE ONE TABLET BY MOUTH 3 TIMES DAILY AS NEEDED 24 tablet 0  . OVER THE COUNTER MEDICATION Apply 1 application topically at bedtime. Magneium cream(MG-12). Apply to feet at bedtime for restless legs.    . Prenatal Vit-Fe Fumarate-FA (PRENATAL MULTIVITAMIN) TABS tablet Take 1 tablet by mouth at bedtime.    Marland Kitchen PROAIR HFA 108 (90 Base) MCG/ACT inhaler INHALE 1-2 PUFFS EVERY 4 HOURS FOR WHEEZING OR SHORTNESS OF BREATH (Patient taking differently: Inhale 1-2 puffs into the lungs every 4 (four) hours as needed for wheezing or shortness of breath. ) 17 g 2  . promethazine-dextromethorphan (PROMETHAZINE-DM) 6.25-15 MG/5ML syrup TAKE 5 mls BY MOUTH 4 TIMES DAILY AS NEEDED FOR COUGH 240 mL 1  . traZODone (DESYREL) 50 MG tablet 1/2-1 tablet for sleep 30 tablet 2  . valACYclovir (VALTREX) 1000 MG tablet TAKE TWO TABLETS BY MOUTH TWICE DAILY 360 tablet 2   No current facility-administered medications on file prior to visit.     No Known Allergies  Physical exam:  Today's Vitals   01/04/19 0857  BP: 127/80  Pulse: 65  Temp: (!) 97.1 F (36.2 C)  Weight: 180 lb (81.6 kg)  Height: 5\' 6"  (1.676 m)   Body mass index is 29.05 kg/m.   Wt Readings from Last 3 Encounters:  01/04/19 180 lb (81.6 kg)  12/21/18 178 lb (80.7 kg)  12/07/18 179 lb 6.4 oz (81.4 kg)     Ht Readings from Last 3 Encounters:  01/04/19 5\' 6"  (1.676 m)  12/21/18 5\' 6"  (1.676 m)  12/07/18 5\' 6"  (1.676 m)      General: The patient is awake, alert and appears not in acute distress. The patient is well  groomed. Head: Normocephalic, atraumatic. Neck is supple. Mallampati 4- overbite, small mouth, crowded teeth. ,  neck circumference:14 inches .  Nasal airflow  patent.  Retrognathia is not seen.  Dental status: intact.  Cardiovascular:  Regular rate and cardiac  rhythm by pulse,  without distended neck veins. Respiratory: Lungs are clear to auscultation.  Skin:  Without evidence of ankle edema, or rash. Trunk: The patient's posture is erect.   Neurologic exam : The patient is awake and alert, oriented to place and time.   Memory subjective described as intact.  Attention span & concentration ability appears normal.  Speech is fluent,  without  dysarthria, dysphonia or aphasia.  Mood and affect are appropriate.   Cranial nerves: no loss of smell or taste reported  Pupils are equal and briskly reactive to light. Funduscopic exam deferred.  Extraocular movements in vertical and horizontal planes were intact and without nystagmus. No Diplopia. Visual fields by finger perimetry are intact. Hearing was intact to soft voice and finger rubbing.    Facial sensation intact to fine touch.  Facial motor strength is symmetric and tongue and uvula move midline.  Neck ROM : rotation, tilt and flexion extension were normal for age and shoulder shrug was symmetrical.    Motor exam:  Symmetric bulk, tone and ROM.   Normal tone without cog wheeling, symmetric grip strength .   Sensory:  Fine touch, pinprick and vibration were tested  and  normal.  Proprioception tested in the upper extremities was normal.   Coordination: Rapid alternating movements in the fingers/hands were of normal speed.  The Finger-to-nose maneuver was intact without evidence of ataxia, dysmetria or tremor.   Gait and station: Patient could rise unassisted from a seated position, walked without assistive device.  Stance is of normal width/ base and the patient turned with 3 steps.  Toe and heel walk were deferred.  Deep tendon reflexes: in the  upper and lower extremities are symmetric and intact.  Babinski response was deferred .        After spending a total time of  35  minutes face to face and additional time for physical and neurologic examination, review of laboratory studies,   personal review of imaging studies, reports and results of other testing and review of referral information / records as far as provided in visit, I have established the following assessments:  1) patient has experienced gasping for breath, before pregnancy but more since  There has been HTN and weight gain, but fluid retention has resolved( dropped 30 pounds)  2) hypervigilance as a new mother is part of the problem of insomnia.  3) snoring, non restorative sleep to be evaluated by [facility] , screening test for OSA.    My Plan is to proceed with:  1) HST  I would like to thank Dr  Unk Pinto, MD [address] [place],  [place] [address] for allowing me to meet with and to take care of this pleasant patient.   In short, Christie Le is presenting with non restorative sleep that had worsened over the time of pregnancy and since delivery.   I plan to follow up either personally or through our NP within 2-3 month.     Electronically signed by: Larey Seat, MD 01/04/2019 9:07 AM  [facility] Board certified by The [employer] of Sleep Medicine and Diplomate of the [employer] of Sleep Medicine. Board certified In Neurology through the [place], Fellow of  the [employer] of Neurology. Medical Director of [facility].

## 2019-01-04 NOTE — Patient Instructions (Signed)

## 2019-01-17 ENCOUNTER — Other Ambulatory Visit: Payer: Self-pay | Admitting: Physician Assistant

## 2019-02-02 ENCOUNTER — Encounter: Payer: Self-pay | Admitting: Internal Medicine

## 2019-02-13 ENCOUNTER — Other Ambulatory Visit: Payer: Self-pay | Admitting: Neurology

## 2019-02-13 ENCOUNTER — Telehealth: Payer: Self-pay | Admitting: Neurology

## 2019-02-13 DIAGNOSIS — O133 Gestational [pregnancy-induced] hypertension without significant proteinuria, third trimester: Secondary | ICD-10-CM

## 2019-02-13 DIAGNOSIS — G478 Other sleep disorders: Secondary | ICD-10-CM

## 2019-02-13 NOTE — Telephone Encounter (Signed)
Order placed for the patient.

## 2019-02-13 NOTE — Telephone Encounter (Signed)
Thank you :)

## 2019-02-13 NOTE — Telephone Encounter (Signed)
Pt left a voicemail enquiring about her home sleep study. Pt seen Dr. Brett Fairy on 01/04/2019. No order was placed for any sleep study. Please place order for hst so, we can get the pt scheduled.

## 2019-02-20 ENCOUNTER — Other Ambulatory Visit: Payer: Self-pay | Admitting: Adult Health

## 2019-02-20 DIAGNOSIS — Z8619 Personal history of other infectious and parasitic diseases: Secondary | ICD-10-CM

## 2019-02-20 DIAGNOSIS — R39198 Other difficulties with micturition: Secondary | ICD-10-CM

## 2019-02-20 NOTE — Progress Notes (Unsigned)
38 y.o. female reports + syphilis screening when recently attempted to donate blood. She reports is O negative and has frequently donated in the past with negative screens. Married with no new known exposures. Of note, she is currently breast feeding, and had + covid test without symptoms last month after known exposure. Requesting recommendation for further follow up. Discussed possibility of false positive test and will schedule lab visit here to pursue further; will check T. Palladium antibody panel per recommendation by Dr. Melford Aase.

## 2019-02-21 ENCOUNTER — Other Ambulatory Visit: Payer: Self-pay | Admitting: Adult Health

## 2019-02-22 ENCOUNTER — Other Ambulatory Visit: Payer: Self-pay

## 2019-02-22 ENCOUNTER — Other Ambulatory Visit: Payer: 59

## 2019-02-22 ENCOUNTER — Other Ambulatory Visit: Payer: Self-pay | Admitting: Adult Health

## 2019-02-22 ENCOUNTER — Ambulatory Visit (INDEPENDENT_AMBULATORY_CARE_PROVIDER_SITE_OTHER): Payer: 59 | Admitting: Neurology

## 2019-02-22 DIAGNOSIS — N910 Primary amenorrhea: Secondary | ICD-10-CM

## 2019-02-22 DIAGNOSIS — G4710 Hypersomnia, unspecified: Secondary | ICD-10-CM | POA: Diagnosis not present

## 2019-02-22 DIAGNOSIS — R0683 Snoring: Secondary | ICD-10-CM

## 2019-02-22 DIAGNOSIS — O133 Gestational [pregnancy-induced] hypertension without significant proteinuria, third trimester: Secondary | ICD-10-CM

## 2019-02-22 DIAGNOSIS — Z8619 Personal history of other infectious and parasitic diseases: Secondary | ICD-10-CM

## 2019-02-22 DIAGNOSIS — R350 Frequency of micturition: Secondary | ICD-10-CM

## 2019-02-22 DIAGNOSIS — G478 Other sleep disorders: Secondary | ICD-10-CM

## 2019-02-22 LAB — POCT URINE PREGNANCY: Preg Test, Ur: NEGATIVE

## 2019-02-23 LAB — URINALYSIS W MICROSCOPIC + REFLEX CULTURE
Bacteria, UA: NONE SEEN /HPF
Bilirubin Urine: NEGATIVE
Glucose, UA: NEGATIVE
Hgb urine dipstick: NEGATIVE
Hyaline Cast: NONE SEEN /LPF
Ketones, ur: NEGATIVE
Leukocyte Esterase: NEGATIVE
Nitrites, Initial: NEGATIVE
Protein, ur: NEGATIVE
RBC / HPF: NONE SEEN /HPF (ref 0–2)
Specific Gravity, Urine: 1.013 (ref 1.001–1.03)
Squamous Epithelial / HPF: NONE SEEN /HPF (ref <=5)
WBC, UA: NONE SEEN /HPF (ref 0–5)
pH: 6.5 (ref 5.0–8.0)

## 2019-02-23 LAB — NO CULTURE INDICATED

## 2019-02-25 LAB — T.PALLIDUM AB, TOTAL: T pallidum Antibodies (TP-PA): NONREACTIVE

## 2019-03-07 ENCOUNTER — Encounter: Payer: Self-pay | Admitting: Neurology

## 2019-03-07 ENCOUNTER — Telehealth: Payer: Self-pay | Admitting: Neurology

## 2019-03-07 DIAGNOSIS — R0683 Snoring: Secondary | ICD-10-CM | POA: Insufficient documentation

## 2019-03-07 DIAGNOSIS — G478 Other sleep disorders: Secondary | ICD-10-CM | POA: Insufficient documentation

## 2019-03-07 NOTE — Telephone Encounter (Signed)
Called patient to discuss sleep study results. No answer at this time. LVM for the patient to call back.   

## 2019-03-07 NOTE — Telephone Encounter (Signed)
-----   Message from Larey Seat, MD sent at 03/07/2019 12:59 PM EST ----- Summary & Diagnosis:   This HST did not identify Sleep apnea , the overall AHI of 3.0/h  is not diagnostic.  Snoring is present, but no hypoxemia, no tachy-bradycardia is  associated.   Recommendations:    No apnea as cause of hypertension and fatigue identified. Snoring can be treated by dental device, positional changes, and weight loss.   Interpreting Physician: Larey Seat, MD

## 2019-03-07 NOTE — Procedures (Signed)
Patient Information     First Name: Christie Last Name: Le ID: [id number]  Birth Date: April 19, 1981 Age: 38 Gender: Female  Referring Provider: Unk Pinto, MD BMI: 29.1 (W=181 lb, H=5' 6'')  Neck Circum.: 50 '' Epworth:  10/24   Sleep Study Information    Study Date: Feb 22, 2019 S/H/A Version: 001.001.001.001 / 4.0.1515 / 77  History:    Christie Le is a right -handed 38 year old Caucasian female with a medical history of Anemia, Asthma, Breech presentation (04/26/2018), Family history of anesthesia complication, Fibrocystic breast, Pneumonia, PONV (postoperative nausea and vomiting), Postpartum care following cesarean delivery (1/8) (04/27/2018), Pregnancy induced hypertension, Previous cesarean section (04/26/2018), S/P laparoscopic cholecystectomy June 15 (09/25/2013), Seasonal allergies, and Vitamin D deficiency. Patient delivered her baby early due to hypertension and her physicians would like to make sure apnea is not a cause for HTN.      Summary & Diagnosis:     This HST did not identify Sleep apnea , the overall AHI of 3.0/h is not diagnostic.    Snoring is present, but no hypoxemia, no tachy-bradycardia is associated.   Recommendations:     No apnea as cause of hypertension and fatigue identified.   Interpreting Physician: Larey Seat, MD           Sleep Summary  Oxygen Saturation Statistics   Start Study Time: End Study Time: Total Recording Time:     10:23:38 PM 6:33:33 AM      8 h, 9 min  Total Sleep Time % REM of Sleep Time:  7 h, 22 min  27.9    Mean: 96 Minimum: 93 Maximum: 99  Mean of Desaturations Nadirs (%):   94  Oxygen Desaturation. %: 4-9 10-20 >20 Total  Events Number Total  3 100.0  0 0.0  0 0.0  3 100.0  Oxygen Saturation: <90 <=88 <85 <80 <70  Duration (minutes): Sleep % 0.0 0.0 0.0 0.0 0.0 0.0 0.0 0.0 0.0 0.0     Respiratory Indices      Total Events REM NREM All Night  pRDI:  125  pAHI:  22 ODI:  3  pAHIc:  3  %    CSR: 0.0 7.8 0.5 0.0 0.0 20.5 4.0 0.6 0.6 17.0 3.0 0.4 0.4       Pulse Rate Statistics during Sleep (BPM)      Mean: 71 Minimum: 47 Maximum: 108    Indices are calculated using technically valid sleep time of  7 h, 21 min.  Central-Indices are calculated using technically valid sleep time of  7  h, 16 min.  pRDI/pAHI are calculated using o2 desaturations ? 3%  Body Position Statistics  Position Supine Prone Right Left Non-Supine  Sleep (min) 276.5 0.5 76.0 89.5 166.0  Sleep % 62.5 0.1 17.2 20.2 37.5  pRDI 19.8 N/A 6.3 17.5 12.3  pAHI 4.8 N/A 0.0 0.0 0.0  ODI 0.7 N/A 0.0 0.0 0.0     Snoring Statistics Snoring Level (dB) >40 >50 >60 >70 >80 >Threshold (45)  Sleep (min) 93.5 3.6 1.4 0.3 0.0 18.3  Sleep % 21.1 0.8 0.3 0.1 0.0 4.1    Mean: 41 dB Sleep Stages Chart   Patient Information     First Name: Arbella Last Name: Aramburu ID: [id number]  Birth Date: 28-Jun-1980 Age: 44 Gender: Female  Referring Provider: Unk Pinto, MD BMI: 29.1 (W=181 lb, H=5' 6'')  Neck Circum.: 14 '' Epworth:  10/24   Sleep Study Information  Study Date: Feb 22, 2019 S/H/A Version: 001.001.001.001 / 4.0.1515 / 77  History:    Christie Le is a right -handed 38 year old Caucasian female with a medical history of Anemia, Asthma, Breech presentation (04/26/2018), Family history of anesthesia complication, Fibrocystic breast, Pneumonia, PONV (postoperative nausea and vomiting), Postpartum care following cesarean delivery (1/8) (04/27/2018), Pregnancy induced hypertension, Previous cesarean section (04/26/2018), S/P laparoscopic cholecystectomy June 15 (09/25/2013), Seasonal allergies, and Vitamin D deficiency. Patient delivered her baby early due to hypertension and her physicians would like to make sure apnea is not a cause for HTN.      Summary & Diagnosis:     This HST did not identify Sleep apnea , the overall AHI of 3.0/h is not diagnostic.    Snoring is present, but no hypoxemia, no  tachy-bradycardia is associated.   Recommendations:     No apnea as cause of hypertension and fatigue identified.   Interpreting Physician: Larey Seat, MD           Sleep Summary  Oxygen Saturation Statistics   Start Study Time: End Study Time: Total Recording Time:     10:23:38 PM 6:33:33 AM      8 h, 9 min  Total Sleep Time % REM of Sleep Time:  7 h, 22 min  27.9    Mean: 96 Minimum: 93 Maximum: 99  Mean of Desaturations Nadirs (%):   94  Oxygen Desaturation. %: 4-9 10-20 >20 Total  Events Number Total  3 100.0  0 0.0  0 0.0  3 100.0  Oxygen Saturation: <90 <=88 <85 <80 <70  Duration (minutes): Sleep % 0.0 0.0 0.0 0.0 0.0 0.0 0.0 0.0 0.0 0.0     Respiratory Indices      Total Events REM NREM All Night  pRDI:  125  pAHI:  22 ODI:  3  pAHIc:  3  %   CSR: 0.0 7.8 0.5 0.0 0.0 20.5 4.0 0.6 0.6 17.0 3.0 0.4 0.4       Pulse Rate Statistics during Sleep (BPM)      Mean: 71 Minimum: 47 Maximum: 108    Indices are calculated using technically valid sleep time of  7 h, 21 min.  Central-Indices are calculated using technically valid sleep time of  7  h, 16 min.  pRDI/pAHI are calculated using o2 desaturations ? 3%  Body Position Statistics  Position Supine Prone Right Left Non-Supine  Sleep (min) 276.5 0.5 76.0 89.5 166.0  Sleep % 62.5 0.1 17.2 20.2 37.5  pRDI 19.8 N/A 6.3 17.5 12.3  pAHI 4.8 N/A 0.0 0.0 0.0  ODI 0.7 N/A 0.0 0.0 0.0     Snoring Statistics Snoring Level (dB) >40 >50 >60 >70 >80 >Threshold (45)  Sleep (min) 93.5 3.6 1.4 0.3 0.0 18.3  Sleep % 21.1 0.8 0.3 0.1 0.0 4.1    Mean: 41 dB Sleep Stages Chart

## 2019-05-16 ENCOUNTER — Encounter: Payer: Self-pay | Admitting: Adult Health

## 2019-05-16 ENCOUNTER — Other Ambulatory Visit: Payer: Self-pay

## 2019-05-16 ENCOUNTER — Ambulatory Visit (INDEPENDENT_AMBULATORY_CARE_PROVIDER_SITE_OTHER): Payer: 59 | Admitting: Adult Health

## 2019-05-16 VITALS — BP 122/80 | HR 64 | Temp 97.9°F | Wt 187.0 lb

## 2019-05-16 DIAGNOSIS — J019 Acute sinusitis, unspecified: Secondary | ICD-10-CM

## 2019-05-16 MED ORDER — PREDNISONE 20 MG PO TABS: ORAL_TABLET | ORAL | 0 refills | Status: DC

## 2019-05-16 NOTE — Progress Notes (Signed)
Assessment and Plan:  Christie Le was seen today for sinusitis, ear pain and sore throat.  Diagnoses and all orders for this visit:  Acute rhinosinusitis Benign exam and history; Likley viral illness superimposed on chronic allergic rhinitis Had negative covid 19 testing 05/15/2019 Discussed the importance of avoiding unnecessary antibiotic therapy. Suggested symptomatic OTC remedies, consider adding sudafed Continue nasal saline spray for congestion. Salt water gargles Nasal steroids, allergy pill, oral steroids offered Follow up as needed, if symptoms persisting longer than 10-14 days will send in abx -     predniSONE (DELTASONE) 20 MG tablet; 2 tablets daily for 3 days, 1 tablet daily for 4 days.   Further disposition pending results of labs. Discussed med's effects and SE's.   Over 15 minutes of exam, counseling, chart review, and critical decision making was performed.   Future Appointments  Date Time Provider [place]  12/21/2019  9:00 AM Vicie Mutters, PA-C [facility] None    ------------------------------------------------------------------------------------------------------------------   HPI BP 122/80   Pulse 64   Temp 97.9 F (36.6 C)   Wt 187 lb (84.8 kg)   SpO2 99%   BMI 30.18 kg/m   39 y.o.female with hx of chronic allergic rhinitis presents for evaluation of URI sx.   She reports began with mild sore throat 5 days ago, has left sided throbbing ear pain began 2 days later, then yesterday started having generalized sinus pressure. She reports nasal congestion but consistent with baseline, also has post-nasal drip but again consistent with baseline. She endorses sinus headache. Denies fever/chills. She endorses mild intermittent non-productive cough, attributes to post-nasal drip.   Ibuprofen/tylenol has helped with headache. Takes singulair, adds certirizine/fexofenadine and fluticasone. She is also doing saline nasal spray. Overall doesn't feel significant  improvement.   Did have negative covid 19 on 05/15/2019, rapid was negative, pending PCR results.     Past Medical History:  Diagnosis Date  . Anemia    hx of  . Asthma    no problems since pregnancy  . Breech presentation 04/26/2018  . Family history of anesthesia complication    "mom is sensitive- severe n/v"  . Fibrocystic breast   . Pneumonia    age 83  . PONV (postoperative nausea and vomiting)   . Postpartum care following cesarean delivery (1/8) 04/27/2018  . Pregnancy induced hypertension   . Previous cesarean section 04/26/2018  . S/P laparoscopic cholecystectomy June 15 09/25/2013   Single small stone; chronic changes;  Small bile ducts   . Seasonal allergies   . Vitamin D deficiency      No Known Allergies  Current Outpatient Medications on File Prior to Visit  Medication Sig  . acetaminophen (TYLENOL) 500 MG tablet Take 2 tablets (1,000 mg total) by mouth every 4 (four) hours as needed for mild pain.  Marland Kitchen albuterol (VENTOLIN HFA) 108 (90 Base) MCG/ACT inhaler INHALE 1 TO 2 PUFFS BY MOUTH EVERY 4 HOURS FOR WHEEZING OR SHORTNESS OF BREATH  . Cholecalciferol (VITAMIN D3) 125 MCG (5000 UT) CAPS Take 5,000 Units by mouth at bedtime.  . fluticasone (FLONASE) 50 MCG/ACT nasal spray USE 2 SPRAYS IN EACH NOSTRIL EVERY DAY  . ibuprofen (ADVIL,MOTRIN) 800 MG tablet Take 1 tablet (800 mg total) by mouth 3 (three) times daily. (Patient taking differently: Take 800 mg by mouth 3 (three) times daily as needed. )  . magnesium oxide (MAG-OX) 400 MG tablet Take 400 mg by mouth at bedtime.  . montelukast (SINGULAIR) 10 MG tablet Take 1 tablet Daily for  Allergies  . Omega 3-6-9 Fatty Acids (OMEGA 3-6-9 COMPLEX PO) Take by mouth daily.  . ondansetron (ZOFRAN-ODT) 4 MG disintegrating tablet DISSOLVE ONE TABLET BY MOUTH 3 TIMES DAILY AS NEEDED  . OVER THE COUNTER MEDICATION Apply 1 application topically at bedtime. Magneium cream(MG-12). Apply to feet at bedtime for restless legs.  . Prenatal  Vit-Fe Fumarate-FA (PRENATAL MULTIVITAMIN) TABS tablet Take 1 tablet by mouth at bedtime.  . promethazine-dextromethorphan (PROMETHAZINE-DM) 6.25-15 MG/5ML syrup TAKE 5 mls BY MOUTH 4 TIMES DAILY AS NEEDED FOR COUGH  . valACYclovir (VALTREX) 1000 MG tablet TAKE TWO TABLETS BY MOUTH TWICE DAILY (Patient taking differently: as needed. )  . BREO ELLIPTA 100-25 MCG/INH AEPB inhale 1 PUFF into THE lungs ONCE A DAY. RINSE MOUTH WITH WATER AFTER EACH USE (Patient not taking: Reported on 05/16/2019)  . buPROPion (WELLBUTRIN XL) 150 MG 24 hr tablet TAKE 2 TABLETS BY MOUTH EVERY MORNING (Patient not taking: Reported on 05/16/2019)  . traZODone (DESYREL) 50 MG tablet 1/2-1 tablet for sleep (Patient not taking: Reported on 05/16/2019)   No current facility-administered medications on file prior to visit.    ROS: all negative except above.   Physical Exam:  BP 122/80   Pulse 64   Temp 97.9 F (36.6 C)   Wt 187 lb (84.8 kg)   SpO2 99%   BMI 30.18 kg/m   General Appearance: Well nourished, in no apparent distress. Eyes: PERRLA, conjunctiva no swelling or erythema Sinuses: She has generalized sinus pressure without erythema or swelling ENT/Mouth: Ext aud canals clear, TMs without erythema, bulging. Post pharynx mildly erythema, without swelling, or exudate on post pharynx. Tonsils not swollen or erythematous. Hearing normal.  Neck: Supple, thyroid normal.  Respiratory: Respiratory effort normal, BS equal bilaterally without rales, rhonchi, wheezing or stridor.  Cardio: RRR with no MRGs. Brisk peripheral pulses without edema.  Abdomen: Soft, + BS.  Non tender Lymphatics: Non tender without lymphadenopathy.  Musculoskeletal: No obvious deformity, normal gait.  Skin: Warm, dry without rashes, lesions, ecchymosis.  Neuro: Cranial nerves intact. Normal muscle tone Psych: Awake and oriented X 3, normal affect, Insight and Judgment appropriate.     Izora Ribas, NP 10:24 AM [facility]

## 2019-05-16 NOTE — Patient Instructions (Signed)

## 2019-05-23 ENCOUNTER — Other Ambulatory Visit: Payer: Self-pay | Admitting: Physician Assistant

## 2019-05-23 DIAGNOSIS — O219 Vomiting of pregnancy, unspecified: Secondary | ICD-10-CM

## 2019-08-29 ENCOUNTER — Other Ambulatory Visit: Payer: Self-pay

## 2019-08-29 ENCOUNTER — Encounter: Payer: Self-pay | Admitting: Physician Assistant

## 2019-08-29 ENCOUNTER — Ambulatory Visit (INDEPENDENT_AMBULATORY_CARE_PROVIDER_SITE_OTHER): Payer: 59 | Admitting: Physician Assistant

## 2019-08-29 VITALS — BP 128/74 | HR 100 | Temp 97.6°F | Ht 66.0 in | Wt 174.0 lb

## 2019-08-29 DIAGNOSIS — N898 Other specified noninflammatory disorders of vagina: Secondary | ICD-10-CM | POA: Diagnosis not present

## 2019-08-29 DIAGNOSIS — R41840 Attention and concentration deficit: Secondary | ICD-10-CM

## 2019-08-29 DIAGNOSIS — L731 Pseudofolliculitis barbae: Secondary | ICD-10-CM | POA: Diagnosis not present

## 2019-08-29 MED ORDER — AMPHETAMINE-DEXTROAMPHETAMINE 10 MG PO TABS: ORAL_TABLET | ORAL | 0 refills | Status: DC

## 2019-08-29 NOTE — Patient Instructions (Addendum)
Warm compresses epson salt bathes If not better let me know We can try an antibiotic if it does not get better  Will get wet prep  Can use boric acid as needed for yeast  Check out non REM resorative sleep  Do not take adderall every day, take it AS needed Stop if it makes you anxious

## 2019-08-29 NOTE — Progress Notes (Signed)
Subjective:    Patient ID: Christie Le, female    DOB: [birth year], 39 y.o.   MRN: [id number]  HPI 39 y.o. WF presents with ingrown hair and yeast.  She has been having intermittent ingrown hair issue x 1 year, will get some pus from it, will come and go. She is prone to to ingrown hairs.  She states that she started to have vaginal itching on Sunday, had intercourse with husband and it has been worse. There is a slight white discharge, itching, no odor to it. Found expired diflucan and monistat that helped.   She states she can handle stress well but she has a hard time focusing on one task at a time, has to bounce around, will get done eventually. Daughter and 2 siblings have ADD, she is wondering. She is not sleeping well, will be asleep 8 hours according to her watch but will have 4 hours restful sleep. No caffeine expect 5 hour energy one in the AM Had sleep study showed only 3 episodes an hour, she did have only 30 mins of REM sleep out of 8 hours. .   Lab Results  Component Value Date   VITAMINB12 1,025 07/09/2017   Lab Results  Component Value Date   IRON 59 12/07/2018   TIBC 332 12/07/2018   FERRITIN 31 12/07/2018   Lab Results  Component Value Date   TSH 1.39 12/07/2018     Blood pressure 128/74, pulse 100, temperature 97.6 F (36.4 C), height 5\' 6"  (1.676 m), weight 174 lb (78.9 kg), SpO2 98 %, unknown if currently breastfeeding.  She is not breast feeding  Medications    Current Outpatient Medications (Respiratory):  .  albuterol (VENTOLIN HFA) 108 (90 Base) MCG/ACT inhaler, INHALE 1 TO 2 PUFFS BY MOUTH EVERY 4 HOURS FOR WHEEZING OR SHORTNESS OF BREATH .  BREO ELLIPTA 100-25 MCG/INH AEPB, inhale 1 PUFF into THE lungs ONCE A DAY. RINSE MOUTH WITH WATER AFTER EACH USE .  fluticasone (FLONASE) 50 MCG/ACT nasal spray, USE 2 SPRAYS IN EACH NOSTRIL EVERY DAY .  montelukast (SINGULAIR) 10 MG tablet, Take 1 tablet Daily for Allergies .  promethazine-dextromethorphan  (PROMETHAZINE-DM) 6.25-15 MG/5ML syrup, TAKE 5 mls BY MOUTH 4 TIMES DAILY AS NEEDED FOR COUGH  Current Outpatient Medications (Analgesics):  .  acetaminophen (TYLENOL) 500 MG tablet, Take 2 tablets (1,000 mg total) by mouth every 4 (four) hours as needed for mild pain. Marland Kitchen  ibuprofen (ADVIL,MOTRIN) 800 MG tablet, Take 1 tablet (800 mg total) by mouth 3 (three) times daily. (Patient taking differently: Take 800 mg by mouth 3 (three) times daily as needed. )   Current Outpatient Medications (Other):  Marland Kitchen  buPROPion (WELLBUTRIN XL) 150 MG 24 hr tablet, TAKE 2 TABLETS BY MOUTH EVERY MORNING .  Cholecalciferol (VITAMIN D3) 125 MCG (5000 UT) CAPS, Take 5,000 Units by mouth at bedtime. .  magnesium oxide (MAG-OX) 400 MG tablet, Take 400 mg by mouth at bedtime. .  Omega 3-6-9 Fatty Acids (OMEGA 3-6-9 COMPLEX PO), Take by mouth daily. .  ondansetron (ZOFRAN-ODT) 4 MG disintegrating tablet, DISSOLVE ONE TABLET BY MOUTH 3 TIMES DAILY AS NEEDED .  OVER THE COUNTER MEDICATION, Apply 1 application topically at bedtime. Magneium cream(MG-12). Apply to feet at bedtime for restless legs. .  Prenatal Vit-Fe Fumarate-FA (PRENATAL MULTIVITAMIN) TABS tablet, Take 1 tablet by mouth at bedtime. .  traZODone (DESYREL) 50 MG tablet, 1/2-1 tablet for sleep .  valACYclovir (VALTREX) 1000 MG tablet, TAKE TWO TABLETS BY  MOUTH TWICE DAILY (Patient taking differently: as needed. )  Problem list She has Allergy; Anemia; Anxiety; Vitamin D deficiency; Ulnar nerve entrapment, left; Gestational hypertension; History of dysplastic nevus; Snoring; and Non-restorative sleep on their problem list.   Review of Systems See HPI    Objective:   Physical Exam Constitutional:      Appearance: Normal appearance. She is normal weight.  HENT:     Right Ear: Tympanic membrane normal.     Left Ear: Tympanic membrane normal.  Eyes:     Extraocular Movements: Extraocular movements intact.     Pupils: Pupils are equal, round, and reactive  to light.  Cardiovascular:     Rate and Rhythm: Normal rate and regular rhythm.  Pulmonary:     Effort: Pulmonary effort is normal.     Breath sounds: Normal breath sounds.  Abdominal:     General: Bowel sounds are normal.     Palpations: Abdomen is soft.     Tenderness: There is no abdominal tenderness. There is no right CVA tenderness or left CVA tenderness.  Genitourinary:    Labia:        Right: No rash, tenderness, lesion or injury.        Left: No rash, tenderness, lesion or injury.      Vagina: Vaginal discharge (thin, clear, non odourous dicharge) present. No erythema, tenderness, bleeding, lesions or prolapsed vaginal walls.     Cervix: Normal.     Comments: Left labia minor with 3-4 mm pea size nodule, non flucuatnt Musculoskeletal:        General: No tenderness. Normal range of motion.     Cervical back: Normal range of motion and neck supple.        Assessment & Plan:    Vaginal discharge -     WET PREP BY MOLECULAR PROBE Decline STD Try boric acid  Follow up if not better  Ingrown hair Not fluctuant, no redness.  Do warm compresses, if not better can put on ABX  Lack of concentration Had sleep study, no OSA but did not hit REM but 20-30 mins- declines another Vitamins normal, continue to exercise, eat well Will try low dose adderall for non REM sleep to see if that helps   Future Appointments  Date Time Provider [place]  12/21/2019  9:00 AM Vicie Mutters, PA-C [facility] None

## 2019-08-30 LAB — WET PREP BY MOLECULAR PROBE
Candida species: NOT DETECTED
Gardnerella vaginalis: NOT DETECTED
MICRO NUMBER:: 10465061
SPECIMEN QUALITY:: ADEQUATE
Trichomonas vaginosis: NOT DETECTED

## 2019-09-25 ENCOUNTER — Other Ambulatory Visit: Payer: Self-pay | Admitting: Physician Assistant

## 2019-09-25 DIAGNOSIS — O219 Vomiting of pregnancy, unspecified: Secondary | ICD-10-CM

## 2019-09-29 ENCOUNTER — Other Ambulatory Visit: Payer: Self-pay | Admitting: Physician Assistant

## 2019-10-25 ENCOUNTER — Other Ambulatory Visit: Payer: Self-pay | Admitting: Internal Medicine

## 2019-10-31 ENCOUNTER — Other Ambulatory Visit: Payer: Self-pay | Admitting: Adult Health

## 2019-10-31 ENCOUNTER — Other Ambulatory Visit: Payer: Self-pay | Admitting: Physician Assistant

## 2019-10-31 MED ORDER — AMPHETAMINE-DEXTROAMPHETAMINE 10 MG PO TABS: ORAL_TABLET | ORAL | 0 refills | Status: DC

## 2019-10-31 NOTE — Progress Notes (Signed)
Spoke with patient on the phone regarding adderall scrip and refill request. She reports 10 mg in AM is working well, however experiencing a crash in the afternoon with current dose. She has been skipping on the weekend 1-2 days/week to avoid tolerance and taking daily. Discussed increasing to BID, try 10 mg in AM, 5-10 mg in afternoon for the next 1-2 months. She will continue to avoid taking daily, regular drug holidays. Script should last longer than 30 days. She will discuss at upcoming appointment with Christie Mutters, PA.   Future Appointments  Date Time Provider [place]  12/21/2019  9:00 AM Christie Mutters, PA-C [facility] None

## 2019-11-27 ENCOUNTER — Other Ambulatory Visit: Payer: Self-pay | Admitting: Physician Assistant

## 2019-12-18 ENCOUNTER — Other Ambulatory Visit: Payer: Self-pay | Admitting: Adult Health

## 2019-12-19 ENCOUNTER — Other Ambulatory Visit: Payer: Self-pay | Admitting: Physician Assistant

## 2019-12-21 ENCOUNTER — Encounter: Payer: 59 | Admitting: Physician Assistant

## 2020-01-22 ENCOUNTER — Encounter: Payer: 59 | Admitting: Physician Assistant

## 2020-01-25 ENCOUNTER — Other Ambulatory Visit: Payer: Self-pay

## 2020-01-25 ENCOUNTER — Ambulatory Visit (INDEPENDENT_AMBULATORY_CARE_PROVIDER_SITE_OTHER): Payer: 59 | Admitting: Physician Assistant

## 2020-01-25 ENCOUNTER — Encounter: Payer: Self-pay | Admitting: Physician Assistant

## 2020-01-25 ENCOUNTER — Other Ambulatory Visit: Payer: Self-pay | Admitting: Internal Medicine

## 2020-01-25 VITALS — BP 114/68 | HR 66 | Temp 97.6°F | Wt 161.0 lb

## 2020-01-25 DIAGNOSIS — G5622 Lesion of ulnar nerve, left upper limb: Secondary | ICD-10-CM

## 2020-01-25 DIAGNOSIS — Z79899 Other long term (current) drug therapy: Secondary | ICD-10-CM

## 2020-01-25 DIAGNOSIS — Z1329 Encounter for screening for other suspected endocrine disorder: Secondary | ICD-10-CM

## 2020-01-25 DIAGNOSIS — T7840XD Allergy, unspecified, subsequent encounter: Secondary | ICD-10-CM

## 2020-01-25 DIAGNOSIS — Z0000 Encounter for general adult medical examination without abnormal findings: Secondary | ICD-10-CM

## 2020-01-25 DIAGNOSIS — Z13220 Encounter for screening for lipoid disorders: Secondary | ICD-10-CM

## 2020-01-25 DIAGNOSIS — Z1389 Encounter for screening for other disorder: Secondary | ICD-10-CM

## 2020-01-25 DIAGNOSIS — O139 Gestational [pregnancy-induced] hypertension without significant proteinuria, unspecified trimester: Secondary | ICD-10-CM

## 2020-01-25 DIAGNOSIS — Z0001 Encounter for general adult medical examination with abnormal findings: Secondary | ICD-10-CM

## 2020-01-25 DIAGNOSIS — G478 Other sleep disorders: Secondary | ICD-10-CM

## 2020-01-25 DIAGNOSIS — D649 Anemia, unspecified: Secondary | ICD-10-CM

## 2020-01-25 DIAGNOSIS — E559 Vitamin D deficiency, unspecified: Secondary | ICD-10-CM

## 2020-01-25 DIAGNOSIS — Z86018 Personal history of other benign neoplasm: Secondary | ICD-10-CM

## 2020-01-25 DIAGNOSIS — F419 Anxiety disorder, unspecified: Secondary | ICD-10-CM

## 2020-01-25 DIAGNOSIS — R0683 Snoring: Secondary | ICD-10-CM

## 2020-01-25 NOTE — Progress Notes (Signed)
Complete Physical  Assessment and Plan:  Screening cholesterol level -     Lipid panel  Sleep apnea, unspecified type -     Ambulatory referral to Sleep Studies - will send for sleep study, she has gasping in the night, fatigue int he morning, will refer to rule out sleep apnea  Insomnia, unspecified type -     traZODone (DESYREL) 50 MG tablet; 1/2-1 tablet for sleep -     Ambulatory referral to Sleep Studies  Vitamin D deficiency -     continue  Anxiety Due to COVID, new baby with the twins and mother in law.  She is on the xanax VERY rarely  Anemia, unspecified type Check recently  Allergic state, subsequent encounter Continue over the counter  BMI 26.0-26.9,adult  Overweight  - long discussion about weight loss, diet, and exercise -recommended diet heavy in fruits and veggies and low in animal meats, cheeses, and dairy products  She had a lot of labs recently Discussed med's effects and SE's. Screening labs and tests as requested with regular follow-up as recommended. Future Appointments  Date Time Provider [place]  01/27/2021  3:00 PM Liane Comber, NP [facility] None    HPI  39 y.o. female  presents for a complete physical.  She follows with derm, has history of basal cell carcinoma.   Has 38 year old twin girls Ovid Curd and Multimedia programmer, at [employer] school but had a case and now they are remote with some increased stress and tension with husband's mom, remarried [place]. Has younger daughter, Elinor Dodge, she is 24 months.  Grandmother passed, was her emotional support, going to therapy now.   Having trouble sleeping, has had sleep issues, she also wakes up tired, wakes up in the middle of the night gas/ping for air. Has been worse with pregnancy.   Her blood pressure has been controlled at home, today their BP is BP: 114/68 She does workout, 3-4 times a week. She denies chest pain, shortness of breath, dizziness.  She is not on cholesterol medication  and denies myalgias. Her cholesterol is at goal. The cholesterol last visit was:   Lab Results  Component Value Date   CHOL 198 12/21/2018   HDL 46 (L) 12/21/2018   LDLCALC 137 (H) 12/21/2018   TRIG 61 12/21/2018   CHOLHDL 4.3 12/21/2018   Last A1C in the office was:  Lab Results  Component Value Date   HGBA1C 5.4 06/28/2015   Patient is on Vitamin D supplement, she is on 5000 IU- has been on 10,000 last few weeks.    Lab Results  Component Value Date   VD25OH 54 12/07/2018   Asthma well controlled, uses albuterol inhaler rarely, on singulair daily that is helping.   BMI is Body mass index is 25.99 kg/m., she is working on diet and exercise. Wt Readings from Last 3 Encounters:  01/25/20 161 lb (73 kg)  08/29/19 174 lb (78.9 kg)  05/16/19 187 lb (84.8 kg)    Current Medications:     Current Outpatient Medications (Respiratory):  .  albuterol (VENTOLIN HFA) 108 (90 Base) MCG/ACT inhaler, INHALE 1 TO 2 PUFFS BY MOUTH EVERY 4 HOURS FOR WHEEZING OR SHORTNESS OF BREATH .  BREO ELLIPTA 100-25 MCG/INH AEPB, inhale 1 PUFF into THE lungs ONCE A DAY. RINSE MOUTH WITH WATER AFTER EACH USE .  fluticasone (FLONASE) 50 MCG/ACT nasal spray, USE 2 SPRAYS IN EACH NOSTRIL EVERY DAY .  montelukast (SINGULAIR) 10 MG tablet, Take 1 tablet Daily for  Allergies .  promethazine-dextromethorphan (PROMETHAZINE-DM) 6.25-15 MG/5ML syrup, TAKE 5 mls BY MOUTH 4 TIMES DAILY AS NEEDED FOR COUGH  Current Outpatient Medications (Analgesics):  .  acetaminophen (TYLENOL) 500 MG tablet, Take 2 tablets (1,000 mg total) by mouth every 4 (four) hours as needed for mild pain. Marland Kitchen  ibuprofen (ADVIL,MOTRIN) 800 MG tablet, Take 1 tablet (800 mg total) by mouth 3 (three) times daily. (Patient taking differently: Take 800 mg by mouth 3 (three) times daily as needed. )   Current Outpatient Medications (Other):  .  amphetamine-dextroamphetamine ([name]) 10 MG tablet, TAKE 1/2 TO 1 TABLET BY MOUTH UPTO 2 TIMES DAILY AS  NEEDED FOR FOCUS. LIMIT TO 5 DAYS A WEEK TO LIMIT ADDICTIVE POTENTIAL SHOULD LAST LONGER THAN 30 DAYS .  buPROPion (WELLBUTRIN XL) 150 MG 24 hr tablet, TAKE 2 TABLETS BY MOUTH EVERY MORNING .  Cholecalciferol (VITAMIN D3) 125 MCG (5000 UT) CAPS, Take 5,000 Units by mouth at bedtime. .  magnesium oxide (MAG-OX) 400 MG tablet, Take 400 mg by mouth at bedtime. .  Omega 3-6-9 Fatty Acids (OMEGA 3-6-9 COMPLEX PO), Take by mouth daily. .  ondansetron (ZOFRAN-ODT) 4 MG disintegrating tablet, DISSOLVE ONE TABLET BY MOUTH 3 TIMES DAILY AS NEEDED .  OVER THE COUNTER MEDICATION, Apply 1 application topically at bedtime. Magneium cream(MG-12). Apply to feet at bedtime for restless legs. .  Prenatal Vit-Fe Fumarate-FA (PRENATAL MULTIVITAMIN) TABS tablet, Take 1 tablet by mouth at bedtime. .  traZODone (DESYREL) 50 MG tablet, 1/2-1 tablet for sleep .  valACYclovir (VALTREX) 1000 MG tablet, TAKE TWO TABLETS BY MOUTH TWICE DAILY (Patient taking differently: as needed. )  Health Maintenance:   Immunization History  Administered Date(s) Administered  . Influenza-Unspecified 11/21/2012  . Tdap 05/10/2012   Tetanus: 04/2012 Pneumovax: N/A Flu vaccine: 2017 Zostavax: N/A  Pap:Dr. Lisbeth Renshaw- Mirena Oct 2018, abnormal pap- colposcopy normal.   MGM: N/A DEXA: N/A Colonoscopy: N/A EGD: N.A  Last Dental Exam: Dr. Aida Puffer Last Eye Exam: None, has been to [facility] in the past  Patient Care Team: Unk Pinto, MD as PCP - General (Internal Medicine)  Medical History:  Past Medical History:  Diagnosis Date  . Anemia    hx of  . Asthma    no problems since pregnancy  . Breech presentation 04/26/2018  . Family history of anesthesia complication    "mom is sensitive- severe n/v"  . Fibrocystic breast   . Pneumonia    age 34  . PONV (postoperative nausea and vomiting)   . Postpartum care following cesarean delivery (1/8) 04/27/2018  . Pregnancy induced hypertension   . Previous cesarean section  04/26/2018  . S/P laparoscopic cholecystectomy June 15 09/25/2013   Single small stone; chronic changes;  Small bile ducts   . Seasonal allergies   . Vitamin D deficiency    Allergies No Known Allergies  SURGICAL HISTORY She  has a past surgical history that includes Cesarean section (07/29/2009); Wisdom tooth extraction (2002); Cholecystectomy (N/A, 09/25/2013); and Cesarean section (N/A, 04/26/2018). FAMILY HISTORY Her family history includes COPD in her maternal grandmother; Cancer in her father and maternal grandmother; Diabetes in her father; Heart disease in her maternal grandmother; Hypertension in her mother and another family member; Skin cancer (age of onset: 1) in her mother. SOCIAL HISTORY She  reports that she has never smoked. She has never used smokeless tobacco. She reports current alcohol use of about 1.0 - 2.0 standard drink of alcohol per week. She reports that she does not use  drugs.  Review of Systems: Review of Systems  Constitutional: Positive for malaise/fatigue. Negative for chills, diaphoresis, fever and weight loss.  HENT: Negative for congestion, ear discharge, ear pain, hearing loss, nosebleeds, sore throat and tinnitus.   Eyes: Negative.   Respiratory: Negative.  Negative for stridor.   Cardiovascular: Negative.   Gastrointestinal: Negative for abdominal pain, blood in stool, constipation, diarrhea, heartburn, melena, nausea and vomiting.  Genitourinary: Negative.   Musculoskeletal: Negative.   Skin: Negative.   Neurological: Negative.  Negative for weakness and headaches.  Endo/Heme/Allergies: Negative.   Psychiatric/Behavioral: Negative.     Physical Exam: Estimated body mass index is 25.99 kg/m as calculated from the following:   Height as of 08/29/19: 5\' 6"  (1.676 m).   Weight as of this encounter: 161 lb (73 kg). BP 114/68   Pulse 66   Temp 97.6 F (36.4 C)   Wt 161 lb (73 kg)   SpO2 99%   BMI 25.99 kg/m  General Appearance: Well nourished, in no  apparent distress.  Eyes: PERRLA, EOMs, conjunctiva no swelling or erythema, normal fundi and vessels.  Sinuses: No Frontal/maxillary tenderness  ENT/Mouth: Ext aud canals clear, normal light reflex with TMs without erythema, bulging. Good dentition. No erythema, swelling, or exudate on post pharynx. Tonsils + swelling with tonsil stone on left side. Hearing normal.  Neck: Supple, thyroid normal. No bruits  Respiratory: Respiratory effort normal, BS equal bilaterally without rales, rhonchi, wheezing or stridor.  Cardio: RRR without murmurs, rubs or gallops. Brisk peripheral pulses without edema.  Chest: symmetric, with normal excursions and percussion.  Breasts: defer OB/GYN Abdomen: Soft, nontender, no guarding, rebound, hernias, masses, or organomegaly. .  Lymphatics: Non tender without lymphadenopathy.  Genitourinary: defer OB/GYN Musculoskeletal: Full ROM all peripheral extremities,5/5 strength, and normal gait.  Skin: Warm, dry without rashes, lesions, ecchymosis. Neuro: Cranial nerves intact, reflexes equal bilaterally. Normal muscle tone, no cerebellar symptoms. Sensation intact.  Psych: Awake and oriented X 3, normal affect, Insight and Judgment appropriate.   EKG: defer  Vicie Mutters 3:49 PM [facility]

## 2020-01-25 NOTE — Patient Instructions (Signed)
Mole A mole is a colored (pigmented) growth on the skin. Moles are very common. They are usually harmless, but some moles can become cancerous over time. What are the causes? Moles are caused when pigmented skin cells grow together in clusters instead of spreading out in the skin as they normally do. The reason why the skin cells grow together in clusters is not known. What increases the risk? You are more likely to develop a mole if you:  Have family members who have moles.  Are white.  Have blond hair.  Are often outdoors and exposed to the sun.  Received phototherapy when you were a newborn baby.  Are female. What are the signs or symptoms? A mole may be:  Brown or black.  Flat or raised.  Smooth or wrinkled. How is this diagnosed? A mole is diagnosed with a skin exam. If your health care provider thinks a mole may be cancerous, all or part of the mole will be removed for testing (biopsy). How is this treated? Most moles are noncancerous (benign) and do not require treatment. If a mole is found to be cancerous, it will be removed. You may also choose to have a mole removed if it is causing pain or if you do not like the way it looks. Follow these instructions at home: General instructions   Every month, look for new moles and check your existing moles for changes. This is important because a change in a mole can mean that the mole has become cancerous.  ABCDE changes in a mole indicate that you should be evaluated by your health care provider. ABCDE stands for: ? Asymmetry. This means the mole has an irregular shape. It is not round or oval. ? Border. This means the mole has an irregular or bumpy border. ? Color. This means the mole has multiple colors in it, including brown, black, blue, red, or tan. Note that it is normal for moles to get darker when a woman is pregnant or takes birth control pills. ? Diameter. This means the mole is more than 0.2 inches (6 mm)  across. ? Evolving. This refers to any unusual changes or symptoms in the mole, such as pain, itching, stinging, sensitivity, or bleeding.  If you have a large number of moles, see a skin doctor (dermatologist) at least one time every year for a full-body skin check. Lifestyle   When you are outdoors, wear sunscreen with SPF 30 (sun protection factor 30) or higher.  Use an adequate amount of sunscreen to cover exposed areas of skin. Put it on 30 minutes before you go out. Reapply it every 2 hours or anytime you come out of the water.  When you are out in the sun, wear a broad-brimmed hat and clothing that covers your arms and legs. Wear wraparound sunglasses. Contact a health care provider if:  The size, shape, borders, or color of your mole changes.  Your mole, or the skin near the mole, becomes painful, sore, red, or swollen.  Your mole: ? Develops more than one color. ? Itches or bleeds. ? Becomes scaly, sheds skin, or oozes fluid. ? Becomes flat or develops raised areas. ? Becomes hard or soft.  You develop a new mole. Summary  A mole is a colored (pigmented) growth on the skin. Moles are very common. They are usually harmless, but some moles can become cancerous over time.  Every month, look for new moles and check your existing moles for changes. This is important   because a change in a mole can mean that the mole has become cancerous.  If you have a large number of moles, see a skin doctor (dermatologist) at least one time every year for a full-body skin check.  When you are outdoors, wear sunscreen with SPF 30 (sun protection factor 30) or higher. Reapply it every 2 hours or anytime you come out of the water.  Contact a health care provider if you notice changes in a mole or if you develop a new mole. This information is not intended to replace advice given to you by your health care provider. Make sure you discuss any questions you have with your health care  provider. Document Revised: 11/10/2018 Document Reviewed: 08/31/2017 Elsevier Patient Education  2020 Elsevier Inc.  

## 2020-01-26 LAB — CBC WITH DIFFERENTIAL/PLATELET
Absolute Monocytes: 503 cells/uL (ref 200–950)
Basophils Absolute: 64 cells/uL (ref 0–200)
Basophils Relative: 0.6 %
Eosinophils Absolute: 64 cells/uL (ref 15–500)
Eosinophils Relative: 0.6 %
HCT: 42.2 % (ref 35.0–45.0)
Hemoglobin: 14 g/dL (ref 11.7–15.5)
Lymphs Abs: 2964 cells/uL (ref 850–3900)
MCH: 29.3 pg (ref 27.0–33.0)
MCHC: 33.2 g/dL (ref 32.0–36.0)
MCV: 88.3 fL (ref 80.0–100.0)
MPV: 11.7 fL (ref 7.5–12.5)
Monocytes Relative: 4.7 %
Neutro Abs: 7105 cells/uL (ref 1500–7800)
Neutrophils Relative %: 66.4 %
Platelets: 352 10*3/uL (ref 140–400)
RBC: 4.78 10*6/uL (ref 3.80–5.10)
RDW: 12 % (ref 11.0–15.0)
Total Lymphocyte: 27.7 %
WBC: 10.7 10*3/uL (ref 3.8–10.8)

## 2020-01-26 LAB — URINALYSIS, ROUTINE W REFLEX MICROSCOPIC
Bilirubin Urine: NEGATIVE
Glucose, UA: NEGATIVE
Hgb urine dipstick: NEGATIVE
Ketones, ur: NEGATIVE
Leukocytes,Ua: NEGATIVE
Nitrite: NEGATIVE
Protein, ur: NEGATIVE
Specific Gravity, Urine: 1.007 (ref 1.001–1.03)
pH: 6 (ref 5.0–8.0)

## 2020-01-26 LAB — LIPID PANEL
Cholesterol: 202 mg/dL — ABNORMAL HIGH (ref <200)
HDL: 46 mg/dL — ABNORMAL LOW (ref >=50)
LDL Cholesterol (Calc): 139 mg/dL (calc) — ABNORMAL HIGH
Non-HDL Cholesterol (Calc): 156 mg/dL (calc) — ABNORMAL HIGH (ref <130)
Total CHOL/HDL Ratio: 4.4 (calc) (ref <5.0)
Triglycerides: 73 mg/dL (ref <150)

## 2020-01-26 LAB — COMPLETE METABOLIC PANEL WITH GFR
AG Ratio: 1.6 (calc) (ref 1.0–2.5)
ALT: 10 U/L (ref 6–29)
AST: 14 U/L (ref 10–30)
Albumin: 4.9 g/dL (ref 3.6–5.1)
Alkaline phosphatase (APISO): 64 U/L (ref 31–125)
BUN: 12 mg/dL (ref 7–25)
CO2: 28 mmol/L (ref 20–32)
Calcium: 10.1 mg/dL (ref 8.6–10.2)
Chloride: 102 mmol/L (ref 98–110)
Creat: 0.8 mg/dL (ref 0.50–1.10)
GFR, Est African American: 108 mL/min/{1.73_m2} (ref >=60)
GFR, Est Non African American: 94 mL/min/{1.73_m2} (ref >=60)
Globulin: 3 g/dL (calc) (ref 1.9–3.7)
Glucose, Bld: 90 mg/dL (ref 65–99)
Potassium: 3.8 mmol/L (ref 3.5–5.3)
Sodium: 138 mmol/L (ref 135–146)
Total Bilirubin: 0.5 mg/dL (ref 0.2–1.2)
Total Protein: 7.9 g/dL (ref 6.1–8.1)

## 2020-01-26 LAB — MICROALBUMIN / CREATININE URINE RATIO
Creatinine, Urine: 52 mg/dL (ref 20–275)
Microalb Creat Ratio: 8 mcg/mg creat (ref <30)
Microalb, Ur: 0.4 mg/dL

## 2020-01-26 LAB — MAGNESIUM: Magnesium: 2.2 mg/dL (ref 1.5–2.5)

## 2020-01-26 LAB — IRON, TOTAL/TOTAL IRON BINDING CAP
%SAT: 19 % (calc) (ref 16–45)
Iron: 74 ug/dL (ref 40–190)
TIBC: 383 mcg/dL (calc) (ref 250–450)

## 2020-01-26 LAB — VITAMIN D 25 HYDROXY (VIT D DEFICIENCY, FRACTURES): Vit D, 25-Hydroxy: 113 ng/mL — ABNORMAL HIGH (ref 30–100)

## 2020-01-26 LAB — FERRITIN: Ferritin: 41 ng/mL (ref 16–154)

## 2020-01-26 LAB — TSH: TSH: 1.22 mIU/L

## 2020-01-29 ENCOUNTER — Other Ambulatory Visit: Payer: Self-pay | Admitting: Adult Health

## 2020-02-01 ENCOUNTER — Encounter (HOSPITAL_BASED_OUTPATIENT_CLINIC_OR_DEPARTMENT_OTHER): Payer: Self-pay | Admitting: Emergency Medicine

## 2020-02-01 ENCOUNTER — Emergency Department (HOSPITAL_BASED_OUTPATIENT_CLINIC_OR_DEPARTMENT_OTHER)
Admission: EM | Admit: 2020-02-01 | Discharge: 2020-02-01 | Disposition: A | Discharge status: Home / Self Care | Payer: 59 | Attending: Emergency Medicine | Admitting: Emergency Medicine

## 2020-02-01 ENCOUNTER — Emergency Department (HOSPITAL_BASED_OUTPATIENT_CLINIC_OR_DEPARTMENT_OTHER): Payer: 59

## 2020-02-01 ENCOUNTER — Other Ambulatory Visit (HOSPITAL_BASED_OUTPATIENT_CLINIC_OR_DEPARTMENT_OTHER): Payer: Self-pay | Admitting: Emergency Medicine

## 2020-02-01 ENCOUNTER — Other Ambulatory Visit: Payer: Self-pay

## 2020-02-01 DIAGNOSIS — N83202 Unspecified ovarian cyst, left side: Secondary | ICD-10-CM | POA: Diagnosis not present

## 2020-02-01 DIAGNOSIS — J45909 Unspecified asthma, uncomplicated: Secondary | ICD-10-CM | POA: Insufficient documentation

## 2020-02-01 DIAGNOSIS — N390 Urinary tract infection, site not specified: Secondary | ICD-10-CM | POA: Insufficient documentation

## 2020-02-01 DIAGNOSIS — R109 Unspecified abdominal pain: Secondary | ICD-10-CM | POA: Diagnosis present

## 2020-02-01 DIAGNOSIS — R1031 Right lower quadrant pain: Secondary | ICD-10-CM

## 2020-02-01 DIAGNOSIS — N83201 Unspecified ovarian cyst, right side: Secondary | ICD-10-CM | POA: Diagnosis not present

## 2020-02-01 LAB — COMPREHENSIVE METABOLIC PANEL
ALT: 15 U/L (ref 0–44)
AST: 17 U/L (ref 15–41)
Albumin: 4.3 g/dL (ref 3.5–5.0)
Alkaline Phosphatase: 52 U/L (ref 38–126)
Anion gap: 7 (ref 5–15)
BUN: 12 mg/dL (ref 6–20)
CO2: 25 mmol/L (ref 22–32)
Calcium: 9.1 mg/dL (ref 8.9–10.3)
Chloride: 106 mmol/L (ref 98–111)
Creatinine, Ser: 0.8 mg/dL (ref 0.44–1.00)
GFR, Estimated: >60 mL/min (ref >60)
Glucose, Bld: 97 mg/dL (ref 70–99)
Potassium: 3.9 mmol/L (ref 3.5–5.1)
Sodium: 138 mmol/L (ref 135–145)
Total Bilirubin: 0.4 mg/dL (ref 0.3–1.2)
Total Protein: 7.7 g/dL (ref 6.5–8.1)

## 2020-02-01 LAB — URINALYSIS, ROUTINE W REFLEX MICROSCOPIC
Bilirubin Urine: NEGATIVE
Glucose, UA: NEGATIVE mg/dL
Ketones, ur: NEGATIVE mg/dL
Leukocytes,Ua: NEGATIVE
Nitrite: NEGATIVE
Protein, ur: NEGATIVE mg/dL
Specific Gravity, Urine: >1.03 — ABNORMAL HIGH (ref 1.005–1.030)
pH: 5.5 (ref 5.0–8.0)

## 2020-02-01 LAB — CBC
HCT: 40.6 % (ref 36.0–46.0)
Hemoglobin: 13.4 g/dL (ref 12.0–15.0)
MCH: 29.5 pg (ref 26.0–34.0)
MCHC: 33 g/dL (ref 30.0–36.0)
MCV: 89.2 fL (ref 80.0–100.0)
Platelets: 283 10*3/uL (ref 150–400)
RBC: 4.55 MIL/uL (ref 3.87–5.11)
RDW: 12.2 % (ref 11.5–15.5)
WBC: 7.3 10*3/uL (ref 4.0–10.5)
nRBC: 0 % (ref 0.0–0.2)

## 2020-02-01 LAB — LIPASE, BLOOD: Lipase: 32 U/L (ref 11–51)

## 2020-02-01 LAB — URINALYSIS, MICROSCOPIC (REFLEX)

## 2020-02-01 LAB — PREGNANCY, URINE: Preg Test, Ur: NEGATIVE

## 2020-02-01 MED ORDER — IOHEXOL 300 MG/ML  SOLN
100.0000 mL | Freq: Once | INTRAMUSCULAR | Status: AC | PRN
  Administered 2020-02-01: 100 mL via INTRAVENOUS

## 2020-02-01 MED ORDER — SODIUM CHLORIDE 0.9 % IV SOLN
1.0000 g | Freq: Once | INTRAVENOUS | Status: AC
  Administered 2020-02-01: 1 g via INTRAVENOUS
  Filled 2020-02-01: qty 10

## 2020-02-01 MED ORDER — ONDANSETRON 8 MG PO TBDP: 8.0000 mg | ORAL_TABLET | Freq: Three times a day (TID) | ORAL | 0 refills | Status: DC | PRN

## 2020-02-01 MED ORDER — CEPHALEXIN 500 MG PO CAPS: 500.0000 mg | ORAL_CAPSULE | Freq: Three times a day (TID) | ORAL | 0 refills | Status: DC

## 2020-02-01 MED ORDER — SODIUM CHLORIDE 0.9 % IV BOLUS
1000.0000 mL | Freq: Once | INTRAVENOUS | Status: AC
  Administered 2020-02-01: 1000 mL via INTRAVENOUS

## 2020-02-01 MED FILL — CEPHALEXIN 500 MG CAPSULE: 500 | 7 days supply | Qty: 21 | Fill #0

## 2020-02-01 MED FILL — ONDANSETRON ODT 8 MG TABLET: 8 | 4 days supply | Qty: 12 | Fill #0

## 2020-02-01 NOTE — ED Provider Notes (Signed)
[facility] EMERGENCY DEPARTMENT Provider Note   CSN: [id number] Arrival date & time: 02/01/20  9379     History Chief Complaint  Patient presents with  . Abdominal Pain  . Diarrhea    Christie Le is a 39 y.o. female.  The history is provided by the patient and medical records.  Abdominal Pain Associated symptoms: diarrhea   Diarrhea Associated symptoms: abdominal pain    Christie Le is a 39 y.o. female who presents to the Emergency Department complaining of abdominal pain. She presents the emergency department for feeling unwell for two days with body aches and diarrhea. Yesterday she developed right lower quadrant abdominal pain with associated nausea. Pain radiates to her upper thigh. She had a temperature to 100.4 last night. Pain is worse with movement. No dysuria. She has an IUD in place and does not have a menstrual cycle. She has a history of prior cholecystectomy, C-section times two. No additional abdominal surgeries. No known sick contacts. She has been fully vaccinated for COVID-19. No vaginal discharge. No new sexual partners.    Past Medical History:  Diagnosis Date  . Anemia    hx of  . Asthma    no problems since pregnancy  . Breech presentation 04/26/2018  . Family history of anesthesia complication    "mom is sensitive- severe n/v"  . Fibrocystic breast   . Pneumonia    age 89  . PONV (postoperative nausea and vomiting)   . Postpartum care following cesarean delivery (1/8) 04/27/2018  . Previous cesarean section 04/26/2018  . S/P laparoscopic cholecystectomy June 15 09/25/2013   Single small stone; chronic changes;  Small bile ducts   . Seasonal allergies   . Vitamin D deficiency     Patient Active Problem List   Diagnosis Date Noted  . Snoring 03/07/2019  . 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 History of dysplastic nevus 09/21/2018  . Gestational hypertension 04/26/2018  . 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 Allergy   . Anemia    . Anxiety   . Vitamin D deficiency     Past Surgical History:  Procedure Laterality Date  . CESAREAN SECTION  07/29/2009  . CESAREAN SECTION N/A 04/26/2018   Procedure: Repeat CESAREAN SECTION;  Surgeon: Servando Salina, MD;  Location: [facility];  Service: Obstetrics;  Laterality: N/A;  EDD: 05/16/18  . CHOLECYSTECTOMY N/A 09/25/2013   Procedure: LAPAROSCOPIC CHOLECYSTECTOMY WITH INTRAOPERATIVE CHOLANGIOGRAM;  Surgeon: Pedro Earls, MD;  Location: [facility];  Service: General;  Laterality: N/A;  . WISDOM TOOTH EXTRACTION  2002     OB History    Gravida  3   Para  2   Term  2   Preterm      AB  1   Living  3     SAB  1   TAB      Ectopic      Multiple  1   Live Births  3           Family History  Problem Relation Age of Onset  . Hypertension Mother   . Skin cancer Mother 71       not melanoma  . Diabetes Father   . Cancer Father        prostate  . Hypertension Other   . Cancer Maternal Grandmother   . COPD Maternal Grandmother   . Heart disease Maternal Grandmother     Social History   Tobacco Use  . Smoking status:  Never Smoker  . Smokeless tobacco: Never Used  Vaping Use  . Vaping Use: Never used  Substance Use Topics  . Alcohol use: Yes    Alcohol/week: 1.0 - 2.0 standard drink    Types: 1 - 2 Glasses of wine per week    Comment: rare  . Drug use: No    Home Medications Prior to Admission medications   Medication Sig Start Date End Date Taking? Authorizing Provider  acetaminophen (TYLENOL) 500 MG tablet Take 2 tablets (1,000 mg total) by mouth every 4 (four) hours as needed for mild pain. 04/30/18   Juliene Pina, CNM  albuterol (VENTOLIN HFA) 108 (90 Base) MCG/ACT inhaler INHALE 1 TO 2 PUFFS BY MOUTH EVERY 4 HOURS FOR WHEEZING OR SHORTNESS OF BREATH 12/20/19   Liane Comber, NP  amphetamine-dextroamphetamine ([name]) 10 MG tablet Take 1/2 - 1 tablet 1 -2 x /day ONLY as needed for Focus & Concentration &  limit to 5 days  /week to avoid Addiction & Dementia (Rx should last 6 weeks) 01/29/20   Unk Pinto, MD  BREO ELLIPTA 100-25 MCG/INH AEPB inhale 1 PUFF into THE lungs ONCE A DAY. RINSE MOUTH WITH WATER AFTER [facility] USE 07/04/18   Vladimir Crofts, PA-C  buPROPion (WELLBUTRIN XL) 150 MG 24 hr tablet TAKE 2 TABLETS BY MOUTH EVERY MORNING 07/04/18   Vladimir Crofts, PA-C  cephALEXin (KEFLEX) 500 MG capsule Take 1 capsule (500 mg total) by mouth 3 (three) times daily. 02/01/20   Quintella Reichert, MD  Cholecalciferol (VITAMIN D3) 125 MCG (5000 UT) CAPS Take 5,000 Units by mouth at bedtime.    [provider]  fluticasone (FLONASE) 50 MCG/ACT nasal spray USE 2 SPRAYS IN [facility] NOSTRIL EVERY DAY 11/27/19   Vladimir Crofts, PA-C  ibuprofen (ADVIL,MOTRIN) 800 MG tablet Take 1 tablet (800 mg total) by mouth 3 (three) times daily. Patient taking differently: Take 800 mg by mouth 3 (three) times daily as needed.  04/30/18   Juliene Pina, CNM  magnesium oxide (MAG-OX) 400 MG tablet Take 400 mg by mouth at bedtime.    [provider]  montelukast (SINGULAIR) 10 MG tablet TAKE 1 TABLET BY MOUTH EVERY DAY FOR allergies 01/25/20   Vladimir Crofts, PA-C  Omega 3-6-9 Fatty Acids (OMEGA 3-6-9 COMPLEX PO) Take by mouth daily.    [provider]  ondansetron (ZOFRAN ODT) 8 MG disintegrating tablet Take 1 tablet (8 mg total) by mouth every 8 (eight) hours as needed for nausea or vomiting. 02/01/20   Quintella Reichert, MD  OVER THE COUNTER MEDICATION Apply 1 application topically at bedtime. Magneium cream(MG-12). Apply to feet at bedtime for restless legs.    [provider]  Prenatal Vit-Fe Fumarate-FA (PRENATAL MULTIVITAMIN) TABS tablet Take 1 tablet by mouth at bedtime.    [provider]  promethazine-dextromethorphan (PROMETHAZINE-DM) 6.25-15 MG/5ML syrup TAKE 5 mls BY MOUTH 4 TIMES DAILY AS NEEDED FOR COUGH 07/04/18   Vladimir Crofts, PA-C  traZODone (DESYREL) 50 MG tablet 1/2-1 tablet  for sleep 12/21/18   Vladimir Crofts, PA-C  valACYclovir (VALTREX) 1000 MG tablet TAKE TWO TABLETS BY MOUTH TWICE DAILY Patient taking differently: as needed.  06/28/18   Vladimir Crofts, PA-C    Allergies    Patient has no known allergies.  Review of Systems   Review of Systems  Gastrointestinal: Positive for abdominal pain and diarrhea.  All other systems reviewed and are negative.   Physical Exam Updated Vital Signs BP 128/84 (  BP Location: Right Arm)   Pulse 88   Temp 98.6 F (37 C) (Oral)   Resp 18   Ht 5\' 6"  (1.676 m)   Wt 71.7 kg   SpO2 99%   BMI 25.50 kg/m   Physical Exam Vitals and nursing note reviewed.  Constitutional:      Appearance: She is well-developed.  HENT:     Head: Normocephalic and atraumatic.  Cardiovascular:     Rate and Rhythm: Normal rate and regular rhythm.  Pulmonary:     Effort: Pulmonary effort is normal. No respiratory distress.  Abdominal:     Palpations: Abdomen is soft.     Tenderness: There is no guarding or rebound.     Comments: Moderate right lower quadrant tenderness.  Musculoskeletal:        General: No tenderness.  Skin:    General: Skin is warm and dry.  Neurological:     Mental Status: She is alert and oriented to person, place, and time.  Psychiatric:        Behavior: Behavior normal.     ED Results / Procedures / Treatments   Labs (all labs ordered are listed, but only abnormal results are displayed) Labs Reviewed  URINALYSIS, ROUTINE W REFLEX MICROSCOPIC - Abnormal; Notable for the following components:      Result Value   Specific Gravity, Urine >1.030 (*)    Hgb urine dipstick SMALL (*)    All other components within normal limits  URINALYSIS, MICROSCOPIC (REFLEX) - Abnormal; Notable for the following components:   Bacteria, UA MANY (*)    All other components within normal limits  URINE CULTURE  LIPASE, BLOOD  COMPREHENSIVE METABOLIC PANEL  CBC  PREGNANCY, URINE    EKG None  Radiology CT  Abdomen Pelvis W Contrast  Result Date: 02/01/2020 CLINICAL DATA:  right lower quadrant pain since last night. Nausea and diarrhea yesterday EXAM: CT ABDOMEN AND PELVIS WITH CONTRAST TECHNIQUE: Multidetector CT imaging of the abdomen and pelvis was performed using the standard protocol following bolus administration of intravenous contrast. CONTRAST:  159mL OMNIPAQUE IOHEXOL 300 MG/ML  SOLN COMPARISON:  12/16/2018 FINDINGS: Lower chest: Clear lung bases. Normal heart size without pericardial or pleural effusion. Hepatobiliary: Normal liver. Cholecystectomy, without biliary ductal dilatation. Pancreas: Normal, without mass or ductal dilatation. Spleen: Normal in size, without focal abnormality. Adrenals/Urinary Tract: Normal adrenal glands. Normal kidneys, without hydronephrosis. Normal urinary bladder. Stomach/Bowel: Normal stomach, without wall thickening. Underdistended left side of the colon. Apparent mild wall thickening is favored to be artifactual, secondary to underdistention. Normal terminal ileum. Normal appendix, including on 54/2 and coronal image 32. Normal small bowel. Vascular/Lymphatic: Normal caliber of the aorta and branch vessels. No abdominopelvic adenopathy. Reproductive: Intrauterine device. A peripherally enhancing right ovarian lesion measures 1.3 cm on 69/2. There is also a left ovarian peripherally enhancing 1.6 cm lesion on 63/2. Prominent gonadal veins, as can be seen with pelvic congestion syndrome. Other: Small volume cul-de-sac fluid is likely physiologic, possibly related to recent cyst or follicle rupture. No free intraperitoneal air. Right paramidline tiny fat containing ventral abdominal wall hernia on 43/2. Musculoskeletal: Disc bulge at L5-S1.  None IMPRESSION: 1. Normal appendix. 2. Bilateral ovarian corpus luteal cysts. Small volume cul-de-sac fluid is likely physiologic, possibly related to recent cyst or follicle rupture. 3. Prominent gonadal veins, as can be seen with  pelvic congestion syndrome. Electronically Signed   By: Abigail Miyamoto M.D.   On: 02/01/2020 11:56    Procedures Procedures (including critical care  time)  Medications Ordered in ED Medications  cefTRIAXone (ROCEPHIN) 1 g in sodium chloride 0.9 % 100 mL IVPB (has no administration in time range)  sodium chloride 0.9 % bolus 1,000 mL (1,000 mLs Intravenous New Bag/Given 02/01/20 1059)  iohexol (OMNIPAQUE) 300 MG/ML solution 100 mL (100 mLs Intravenous Contrast Given 02/01/20 1125)    ED Course  I have reviewed the triage vital signs and the nursing notes.  Pertinent labs & imaging results that were available during my care of the patient were reviewed by me and considered in my medical decision making (see chart for details).    MDM Rules/Calculators/A&P                         patient here for evaluation of lower abdominal pain, nausea, diarrhea. She does have lower abdominal tenderness on examination without peritoneal findings. UA with numerous bacteria present but no other findings of UTI. CT scan with bilateral ovarian cyst that appear physiologic, question recently ruptured cyst. No evidence of appendicitis. Presentation is not consistent with PID, torsion. Given her low-grade temperature, nausea and bacteria in her urine will treat for UTI. Discussed with patient home care for UTI, ovarian cyst. Discussed outpatient follow-up and return precautions.  Final Clinical Impression(s) / ED Diagnoses Final diagnoses:  Right lower quadrant abdominal pain  Acute UTI  Cysts of both ovaries    Rx / DC Orders ED Discharge Orders         Ordered    cephALEXin (KEFLEX) 500 MG capsule  3 times daily        02/01/20 1222    ondansetron (ZOFRAN ODT) 8 MG disintegrating tablet  Every 8 hours PRN        02/01/20 1222           Quintella Reichert, MD 02/01/20 1227

## 2020-02-01 NOTE — ED Triage Notes (Signed)
Pt is having RLQ since last night and had diarrhea and nausea yesterday. 100.4 temp last night, resolved today. Pt has dull RLQ pain at rest that is localized and radiates down leg. Denies cough. Covid vaccine UTD

## 2020-02-02 LAB — URINE CULTURE: Culture: NO GROWTH

## 2020-02-19 ENCOUNTER — Encounter: Payer: 59 | Admitting: Internal Medicine

## 2020-02-28 ENCOUNTER — Ambulatory Visit (INDEPENDENT_AMBULATORY_CARE_PROVIDER_SITE_OTHER): Payer: 59 | Admitting: Internal Medicine

## 2020-02-28 ENCOUNTER — Other Ambulatory Visit: Payer: Self-pay

## 2020-02-28 ENCOUNTER — Other Ambulatory Visit: Payer: Self-pay | Admitting: Internal Medicine

## 2020-02-28 ENCOUNTER — Encounter: Payer: Self-pay | Admitting: Internal Medicine

## 2020-02-28 VITALS — BP 116/76 | HR 68 | Temp 97.2°F | Resp 16 | Wt 167.2 lb

## 2020-02-28 DIAGNOSIS — D485 Neoplasm of uncertain behavior of skin: Secondary | ICD-10-CM | POA: Diagnosis not present

## 2020-02-28 NOTE — Progress Notes (Signed)
History of Present Illness:      Patient is a very nice 39 yo WF  Presenting with concerns re: an irregular pigmented skin lesion of her distal mid medial Rt lower leg.   Medications  Current Outpatient Medications (Respiratory):  .  albuterol (VENTOLIN HFA) 108 (90 Base) MCG/ACT inhaler, INHALE 1 TO 2 PUFFS BY MOUTH EVERY 4 HOURS FOR WHEEZING OR SHORTNESS OF BREATH .  BREO ELLIPTA 100-25 MCG/INH AEPB, inhale 1 PUFF into THE lungs ONCE A DAY. RINSE MOUTH WITH WATER AFTER EACH USE .  fluticasone (FLONASE) 50 MCG/ACT nasal spray, USE 2 SPRAYS IN EACH NOSTRIL EVERY DAY .  montelukast (SINGULAIR) 10 MG tablet, TAKE 1 TABLET BY MOUTH EVERY DAY FOR allergies .  promethazine-dextromethorphan (PROMETHAZINE-DM) 6.25-15 MG/5ML syrup, TAKE 5 mls BY MOUTH 4 TIMES DAILY AS NEEDED FOR COUGH  Current Outpatient Medications (Analgesics):  .  acetaminophen (TYLENOL) 500 MG tablet, Take 2 tablets (1,000 mg total) by mouth every 4 (four) hours as needed for mild pain. Marland Kitchen  ibuprofen (ADVIL,MOTRIN) 800 MG tablet, Take 1 tablet (800 mg total) by mouth 3 (three) times daily. (Patient taking differently: Take 800 mg by mouth 3 (three) times daily as needed. )   Current Outpatient Medications (Other):  .  amphetamine-dextroamphetamine ([name]) 10 MG tablet, Take 1/2 - 1 tablet 1 -2 x /day ONLY as needed for Focus & Concentration &  limit to 5 days /week to avoid Addiction & Dementia (Rx should last 6 weeks) .  buPROPion (WELLBUTRIN XL) 150 MG 24 hr tablet, TAKE 2 TABLETS BY MOUTH EVERY MORNING .  cephALEXin (KEFLEX) 500 MG capsule, Take 1 capsule (500 mg total) by mouth 3 (three) times daily. .  Cholecalciferol (VITAMIN D3) 125 MCG (5000 UT) CAPS, Take 5,000 Units by mouth at bedtime. .  magnesium oxide (MAG-OX) 400 MG tablet, Take 400 mg by mouth at bedtime. .  Omega 3-6-9 Fatty Acids (OMEGA 3-6-9 COMPLEX PO), Take by mouth daily. .  ondansetron (ZOFRAN ODT) 8 MG disintegrating tablet, Take 1 tablet (8 mg  total) by mouth every 8 (eight) hours as needed for nausea or vomiting. Marland Kitchen  OVER THE COUNTER MEDICATION, Apply 1 application topically at bedtime. Magneium cream(MG-12). Apply to feet at bedtime for restless legs. .  Prenatal Vit-Fe Fumarate-FA (PRENATAL MULTIVITAMIN) TABS tablet, Take 1 tablet by mouth at bedtime. .  traZODone (DESYREL) 50 MG tablet, 1/2-1 tablet for sleep .  valACYclovir (VALTREX) 1000 MG tablet, TAKE TWO TABLETS BY MOUTH TWICE DAILY (Patient taking differently: as needed. )  Problem list She has Allergy; Anemia; Anxiety; Vitamin D deficiency; Ulnar nerve entrapment, left; Gestational hypertension; History of dysplastic nevus; Snoring; and Non-restorative sleep on their problem list.   Observations/Objective:   BP 116/76   Pulse 68   Temp (!) 97.2 F (36.2 C)   Resp 16   Wt 167 lb 3.2 oz (75.8 kg)   SpO2 98%   BMI 26.99 kg/m   Skin focused exam - 3 mm x 4 mm medium /light brown flat lesion with irregular margins of the medial distal Rt leg.   Procedure ( CPT 11300)     After informed consent and aseptic prep with alcohol, the lesion was excised by excisional shave technique with a #10 scalpel. The wound base was hyfrecated for hemostasis and covered with antibiotic Ung and sterile band aid. Patient was instructed in post-op wound care.   Assessment and Plan:   1. Neoplasm of uncertain behavior of skin of lower extremity  -  Surgical pathology  Follow Up Instructions:         I discussed the assessment and treatment plan with the patient. The patient was provided an opportunity to ask questions and all were answered. The patient agreed with the plan and demonstrated an understanding of the instructions.    Kirtland Bouchard, MD

## 2020-03-06 NOTE — Progress Notes (Signed)
================================================  -   Path report finally returned and shows an atypical or  pre- cancerous nevus or mole &  pathologist says 1 of the margins was close &  suggested to do a re-excision .  - So please schedule an office visit for a repeat procedure   - bill mck  ===============================================

## 2020-04-03 ENCOUNTER — Other Ambulatory Visit: Payer: Self-pay | Admitting: Internal Medicine

## 2020-04-03 ENCOUNTER — Ambulatory Visit (INDEPENDENT_AMBULATORY_CARE_PROVIDER_SITE_OTHER): Payer: 59 | Admitting: Internal Medicine

## 2020-04-03 ENCOUNTER — Other Ambulatory Visit: Payer: Self-pay

## 2020-04-03 VITALS — BP 106/70 | HR 69 | Temp 97.0°F | Resp 16 | Ht 67.0 in | Wt 168.0 lb

## 2020-04-03 DIAGNOSIS — D2371 Other benign neoplasm of skin of right lower limb, including hip: Secondary | ICD-10-CM

## 2020-04-03 NOTE — Progress Notes (Signed)
   History of Present Illness:      This very nice  39 yo   with hx/o dysplastic nevus (09/2018 Lt Breast and  labile HTN presents for re-excision of recent  bx of  an irregular pigmented skin lesion of her distal mid medial Rt lower leg that found an atypical nevus and 1 margin was involved   ((Previous PathCAMEY, EDELL [phone]))  Medications .  Albuterol  HFA inhaler, INHALE 1 TO 2 PUFFS  EVERY 4 HRS  .  BREO ELLIPTA 100-25 , inhale 1 PUFF  ONCE A DAY .  FLONASE   nasal spray, USE 2 SPRAYS IN EACH NOSTRIL EVERY DAY .  montelukast  10 MG , TAKE 1 TABLET  EVERY DAY  .  acetaminophen  500 MG , Take 2 tablets   every 4  hours as needed .  ibuprofen  800 MG tablet, Take 1 tablet  3  times daily  as needed .  buPROPion -XL) 150 MG, TAKE 2 TABs  EVERY MORNING .  VITAMIN D 5000 u , Take  at bedtime. Marland Kitchen  MAG-OX 400 MG t, Take  at bedtime. .  OMEGA 3-6-9 COMPLEX , Take  daily. .  Ondansetron ODT 8 MG , Take 1 tablet  every 8  hours as needed  .  valACYclovir  1000 MG , TAKE TWO TABLETSTWICE DAILY  as needed  Problem list She has Allergy; Anemia; Anxiety; Vitamin D deficiency; Ulnar nerve entrapment, left; Gestational hypertension; History of dysplastic nevus; Snoring; and Non-restorative sleep on their problem list.   Observations/Objective:   BP 106/70   Pulse 69   Temp (!) 97 F (36.1 C)   Resp 16   Ht 5\' 7"  (1.702 m)   Wt 168 lb (76.2 kg)   SpO2 99%   BMI 26.31 kg/m   Healing crusted wound medial mid Right calf  Procedure (CPT 11601)   After informed consent and aseptic prep with alcohol scrub the prior wound site was anesthetized with 1 ml of Marcaine 0.5% sq & intradermal. Then with a #10 scalpel, the wound site was sharply removed  Full dermal thicknessin an elliptical fashion. Then the wound edges were approximated with #3 vertical mattress sutures of Nylon 3-0 and then final; alignment /eversion with  #1 horizontal mattress suture of Nylon 3-0. Wound was dressed  with Antibiotic ung & covered with a sterile 32" x 3" Tegaderm pad.  Excised lesion was sent for path analysis and patient was instructed in wound care Assessment and Plan:   1. Dysplastic nevus of lower extremity, right  - Surgical pathology  Follow Up Instructions: ROV 10-12 days for suture removal       I discussed the assessment and treatment plan with the patient. The patient was provided an opportunity to ask questions and all were answered. The patient agreed with the plan and demonstrated an understanding of the instructions.    Kirtland Bouchard, MD

## 2020-04-06 ENCOUNTER — Encounter: Payer: Self-pay | Admitting: Internal Medicine

## 2020-04-08 NOTE — Progress Notes (Signed)
================================================================ ================================================================  -    Excisional Bx returned Great !   - Margins Free with NO Residual Dysplastic nevus  ================================================================ ================================================================

## 2020-04-15 ENCOUNTER — Other Ambulatory Visit: Payer: Self-pay

## 2020-04-15 ENCOUNTER — Encounter: Payer: Self-pay | Admitting: Internal Medicine

## 2020-04-15 ENCOUNTER — Other Ambulatory Visit: Payer: Self-pay | Admitting: Internal Medicine

## 2020-04-15 ENCOUNTER — Ambulatory Visit: Payer: 59 | Admitting: Internal Medicine

## 2020-04-15 DIAGNOSIS — U071 COVID-19: Secondary | ICD-10-CM

## 2020-04-15 MED ORDER — DEXAMETHASONE 4 MG PO TABS: ORAL_TABLET | ORAL | 0 refills | Status: DC

## 2020-04-15 NOTE — Progress Notes (Signed)
-   Patient returns for suture removal from re-excision  of a dysplastic nevus of the RLE on 04/03/2020.   - Wound appears clean & healing w/o signs of infection.  - Path returned showing clear margins.   - Sutures removed in parking lot as patient tested (+) Positive for Covid several days ago.  - She reports her "worst" sx's with Covid lasted ~ 2 days and are improving the last 2 days.

## 2020-04-29 ENCOUNTER — Other Ambulatory Visit: Payer: Self-pay | Admitting: Internal Medicine

## 2020-04-29 MED ORDER — AZITHROMYCIN 250 MG PO TABS: ORAL_TABLET | ORAL | 1 refills | Status: DC

## 2020-04-29 MED ORDER — PSEUDOEPHEDRINE HCL ER 120 MG PO TB12: ORAL_TABLET | ORAL | 0 refills | Status: DC

## 2020-05-07 ENCOUNTER — Other Ambulatory Visit: Payer: Self-pay | Admitting: Internal Medicine

## 2020-05-21 ENCOUNTER — Other Ambulatory Visit: Payer: Self-pay | Admitting: Internal Medicine

## 2020-05-21 DIAGNOSIS — K2100 Gastro-esophageal reflux disease with esophagitis, without bleeding: Secondary | ICD-10-CM

## 2020-05-21 MED ORDER — PANTOPRAZOLE SODIUM 40 MG PO TBEC: DELAYED_RELEASE_TABLET | ORAL | 0 refills | Status: DC

## 2020-06-17 ENCOUNTER — Other Ambulatory Visit: Payer: Self-pay | Admitting: Internal Medicine

## 2020-07-17 ENCOUNTER — Other Ambulatory Visit: Payer: Self-pay | Admitting: Internal Medicine

## 2020-07-17 DIAGNOSIS — O219 Vomiting of pregnancy, unspecified: Secondary | ICD-10-CM

## 2020-07-24 ENCOUNTER — Encounter: Payer: Self-pay | Admitting: Adult Health

## 2020-07-24 DIAGNOSIS — E663 Overweight: Secondary | ICD-10-CM | POA: Insufficient documentation

## 2020-07-24 DIAGNOSIS — E785 Hyperlipidemia, unspecified: Secondary | ICD-10-CM | POA: Insufficient documentation

## 2020-07-24 NOTE — Progress Notes (Signed)
FOLLOW UP  Assessment and Plan:   Christie Le was seen today for follow-up.  Diagnoses and all orders for this visit:  Seasonal affective disorder ([place]) Doing well with seasonal/PRN Wellbutrin  Hyperlipidemia, unspecified hyperlipidemia type Plan to start rosuvastatin 5 mg if persistently elevated Continue low cholesterol diet and exercise.  Check lipid panel.  -     Lipid panel -     TSH  Vitamin D deficiency -     VITAMIN D 25 Hydroxy (Vit-D Deficiency, Fractures)  Overweight (BMI 25.0-29.9) Long discussion about weight loss, diet, and exercise Recommended diet heavy in fruits and veggies and low in animal meats, cheeses, and dairy products, appropriate calorie intake Discussed appropriate weight for height Follow up at next visit  Mild intermittent asthma without complication Avoid triggers; singulare works well   Herniation of intervertebral disc between L5 and S1 - negative straight leg  prescribed,NSAIDs, RICE, and exercise given Will refer to PT/orthopedics. Proper lifting, bending technique discussed. Regular aerobic and trunk strengthening exercises discussed. Muscle relaxants per medication orders. -     Ambulatory referral to Orthopedics -     meloxicam (MOBIC) 15 MG tablet; Take one daily with food for 2 weeks, can take with tylenol, can not take with aleve, iburpofen, then as needed daily for pain -     baclofen (LIORESAL) 10 MG tablet; Take 0.5-1 tablets (5-10 mg total) by mouth 3 (three) times daily.  Medication management -     CBC with Differential/Platelet -     COMPLETE METABOLIC PANEL WITH GFR  Inflamed/infected skin tag MOnitor with topical abx; if not improving start oral abx Call to schedule skin tag removal once fully healed -     doxycycline (VIBRAMYCIN) 100 MG capsule; Take 1 capsule 2 x/day with food for 10 days.  Continue diet and meds as discussed. Further disposition pending results of labs. Discussed med's effects and SE's.   Over 30  minutes of exam, counseling, chart review, and critical decision making was performed.   Future Appointments  Date Time Provider [place]  01/27/2021  3:00 PM Liane Comber, NP [facility] None    ----------------------------------------------------------------------------------------------------------------------  HPI 40 y.o. female  presents for 3 month follow up on anxiety, hyperlipidemia, weight, vitamin D def, allergies.   She is concerned about very inflamed skin tag under left arm pit - 3 days, very inflamed, tender. Has been doing topical antibiotic -   Takes Wellbutrin PRN for focus/seasonal affective which works well, has taken adderall in the past with lots of agitation.   She takes singulaire daily, breo/albuterol PRN for mild intermittent asthma. Certain smells and allergies trigger.   She has GERD and has been on pantoprazole for 1 month and much improved.   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 CT 02/01/2020 incidentally showed bulging L5-S1, denies radicular sx but does note frequent left sided muscle spasms, skelaxin does help some.  BMI is Body mass index is 26.78 kg/m., she has been working on diet, exercise has been limited due to back pain.  Wt Readings from Last 3 Encounters:  07/25/20 171 lb (77.6 kg)  04/03/20 168 lb (76.2 kg)  02/28/20 167 lb 3.2 oz (75.8 kg)   Hx of gestational hypertension Her blood pressure has been controlled at home, today their BP is BP: 118/78  She does not workout. She denies chest pain, shortness of breath, dizziness.  She is not on cholesterol medication. Her cholesterol is not at goal. The cholesterol last visit was:   Lab Results  Component Value Date   CHOL 202 (H) 01/25/2020   HDL 46 (L) 01/25/2020   LDLCALC 139 (H) 01/25/2020   TRIG 73 01/25/2020   CHOLHDL 4.4 01/25/2020    Last A1C in the office was:  Lab  Results  Component Value Date   HGBA1C 5.4 06/28/2015   Patient is on Vitamin D supplement, reports has reduced from 10000 IU to 5000 IU  Lab Results  Component Value Date   VD25OH 113 (H) 01/25/2020        Current Medications:  Current Outpatient Medications on File Prior to Visit  Medication Sig  . acetaminophen (TYLENOL) 500 MG tablet Take 2 tablets (1,000 mg total) by mouth every 4 (four) hours as needed for mild pain.  Marland Kitchen albuterol (VENTOLIN HFA) 108 (90 Base) MCG/ACT inhaler INHALE 1 TO 2 PUFFS BY MOUTH EVERY 4 HOURS FOR WHEEZING OR SHORTNESS OF BREATH  . aspirin EC 81 MG tablet Take 81 mg by mouth daily. Swallow whole.  Marland Kitchen BREO ELLIPTA 100-25 MCG/INH AEPB inhale 1 PUFF into THE lungs ONCE A DAY. RINSE MOUTH WITH WATER AFTER EACH USE  . buPROPion (WELLBUTRIN XL) 150 MG 24 hr tablet TAKE 2 TABLETS BY MOUTH EVERY MORNING (Patient taking differently: No sig reported)  . Cholecalciferol (VITAMIN D3) 125 MCG (5000 UT) CAPS Take 5,000 Units by mouth at bedtime.  . fluticasone (FLONASE) 50 MCG/ACT nasal spray USE 2 SPRAYS IN EACH NOSTRIL EVERY DAY  . magnesium oxide (MAG-OX) 400 MG tablet Take 400 mg by mouth at bedtime.  . montelukast (SINGULAIR) 10 MG tablet TAKE 1 TABLET BY MOUTH EVERY DAY FOR allergies  . Omega 3-6-9 Fatty Acids (OMEGA 3-6-9 COMPLEX PO) Take by mouth daily.  . ondansetron (ZOFRAN-ODT) 4 MG disintegrating tablet Dissolve  1 tablet  under tongue  3 x/day  as needed  for Nausea /Vomitting  . OVER THE COUNTER MEDICATION Apply 1 application topically at bedtime. Magneium cream(MG-12). Apply to feet at bedtime for restless legs.  . pantoprazole (PROTONIX) 40 MG tablet Take  1 tablet  Daily  for Indigestion & Acid Reflux  . pseudoephedrine (SUDAFED) 120 MG 12 hr tablet Take   1 tablet   2 x /day (every 12 hours)    for Head and Chest Congestion  . valACYclovir (VALTREX) 1000 MG tablet TAKE 2 TABLETS BY MOUTH 2 TIMES DAILY   No current facility-administered medications on  file prior to visit.     Allergies: No Known Allergies   Medical History:  Past Medical History:  Diagnosis Date  . Anemia    hx of  . Asthma    no problems since pregnancy  . Breech presentation 04/26/2018  . Family history of anesthesia complication    "mom is sensitive- severe n/v"  . Fibrocystic breast   . Gallstones 09/25/2013  . Gestational hypertension 04/26/2018  . Pneumonia    age 73  . PONV (postoperative nausea and vomiting)   . Postpartum care following cesarean delivery (1/8) 04/27/2018  . Previous cesarean section 04/26/2018  . S/P laparoscopic cholecystectomy June 15 09/25/2013   Single small stone; chronic changes;  Small bile ducts   . Seasonal allergies   . Vitamin D deficiency    Family history- Reviewed and unchanged Social history- Reviewed and unchanged   Review of Systems:  Review of Systems  Constitutional: Negative for malaise/fatigue and weight loss.  HENT: Negative for hearing loss and tinnitus.   Eyes: Negative for blurred vision and double vision.  Respiratory: Negative for cough, shortness of breath and wheezing.   Cardiovascular: Negative for chest pain, palpitations, orthopnea, claudication and leg swelling.  Gastrointestinal: Negative for abdominal pain, blood in stool, constipation, diarrhea, heartburn, melena, nausea and vomiting.  Genitourinary: Negative.   Musculoskeletal: Positive for back pain (chronic midline/left lumbar). Negative for joint pain and myalgias.  Skin: Negative for rash.  Neurological: Negative for dizziness, tingling, sensory change, weakness and headaches.  Endo/Heme/Allergies: Negative for polydipsia.  Psychiatric/Behavioral: Negative.   All other systems reviewed and are negative.   Physical Exam: BP 118/78   Pulse 77   Temp (!) 97.3 F (36.3 C)   Wt 171 lb (77.6 kg)   SpO2 97%   BMI 26.78 kg/m  Wt Readings from Last 3 Encounters:  07/25/20 171 lb (77.6 kg)  04/03/20 168 lb (76.2 kg)  02/28/20 167 lb 3.2 oz  (75.8 kg)   General Appearance: Well nourished, in no apparent distress. Eyes: PERRLA, EOMs, conjunctiva no swelling or erythema Sinuses: No Frontal/maxillary tenderness ENT/Mouth: Ext aud canals clear, TMs without erythema, bulging. No erythema, swelling, or exudate on post pharynx.  Tonsils not swollen or erythematous. Hearing normal.  Neck: Supple, thyroid normal.  Respiratory: Respiratory effort normal, BS equal bilaterally without rales, rhonchi, wheezing or stridor.  Cardio: RRR with no MRGs. Brisk peripheral pulses without edema.  Abdomen: Soft, + BS.  Non tender, no guarding, rebound, hernias, masses. Lymphatics: Non tender without lymphadenopathy.  Musculoskeletal: Full ROM, 5/5 strength,  Patient is able to ambulate well. Gait is not  Antalgic. Straight leg raising with dorsiflexion negative bilaterally for radicular symptoms. Sensory exam in the legs are normal. Knee reflexes are normal Ankle reflexes are normal Strength is normal and symmetric in arms and legs. There is not SI tenderness to palpation.  There is paraspinal muscle spasm.  There is not midline tenderness.  ROM of spine not significantly limited by pain.  Skin: Warm, dry without rashes, ecchymosis. She has skin tag with very inflamed base with mild subdermal thickening/lump without clear fluctuance Neuro: Cranial nerves intact. No cerebellar symptoms.  Psych: Awake and oriented X 3, normal affect, Insight and Judgment appropriate.    Izora Ribas, NP 12:21 PM [facility]

## 2020-07-25 ENCOUNTER — Encounter: Payer: Self-pay | Admitting: Adult Health

## 2020-07-25 ENCOUNTER — Ambulatory Visit (INDEPENDENT_AMBULATORY_CARE_PROVIDER_SITE_OTHER): Payer: 59 | Admitting: Adult Health

## 2020-07-25 ENCOUNTER — Other Ambulatory Visit: Payer: Self-pay

## 2020-07-25 VITALS — BP 118/78 | HR 77 | Temp 97.3°F | Wt 171.0 lb

## 2020-07-25 DIAGNOSIS — E785 Hyperlipidemia, unspecified: Secondary | ICD-10-CM | POA: Diagnosis not present

## 2020-07-25 DIAGNOSIS — E663 Overweight: Secondary | ICD-10-CM

## 2020-07-25 DIAGNOSIS — J45909 Unspecified asthma, uncomplicated: Secondary | ICD-10-CM | POA: Insufficient documentation

## 2020-07-25 DIAGNOSIS — E559 Vitamin D deficiency, unspecified: Secondary | ICD-10-CM

## 2020-07-25 DIAGNOSIS — M5127 Other intervertebral disc displacement, lumbosacral region: Secondary | ICD-10-CM

## 2020-07-25 DIAGNOSIS — J4520 Mild intermittent asthma, uncomplicated: Secondary | ICD-10-CM

## 2020-07-25 DIAGNOSIS — L918 Other hypertrophic disorders of the skin: Secondary | ICD-10-CM

## 2020-07-25 DIAGNOSIS — F419 Anxiety disorder, unspecified: Secondary | ICD-10-CM

## 2020-07-25 DIAGNOSIS — F338 Other recurrent depressive disorders: Secondary | ICD-10-CM | POA: Diagnosis not present

## 2020-07-25 DIAGNOSIS — Z79899 Other long term (current) drug therapy: Secondary | ICD-10-CM

## 2020-07-25 DIAGNOSIS — T7840XD Allergy, unspecified, subsequent encounter: Secondary | ICD-10-CM

## 2020-07-25 MED ORDER — DOXYCYCLINE HYCLATE 100 MG PO CAPS: ORAL_CAPSULE | ORAL | 0 refills | Status: DC

## 2020-07-25 MED ORDER — MELOXICAM 15 MG PO TABS: ORAL_TABLET | ORAL | 1 refills | Status: DC

## 2020-07-25 MED ORDER — BACLOFEN 10 MG PO TABS: 5.0000 mg | ORAL_TABLET | Freq: Three times a day (TID) | ORAL | 1 refills | Status: DC

## 2020-07-25 NOTE — Patient Instructions (Signed)
Try adding soluble fiber supplement - citrucel/benefiber -   Please go to the ER if you have any severe AB pain, unable to hold down food/water, blood in stool or vomit, chest pain, shortness of breath, or any worsening symptoms.   Consider keeping a food diary- common causes of diarrhea are dairy, certain carbs...  FODMAP stands for fermentable oligo-, di-, mono-saccharides and polyols (1). These are the scientific terms used to classify groups of carbs that are notorious for triggering digestive symptoms like bloating, gas and stomach pain.   FODMAPs are found in a wide range of foods in varying amounts. Some foods contain just one type, while others contain several.  The main dietary sources of the four groups of FODMAPs include:  Oligosaccharides: Wheat, rye, legumes and various fruits and vegetables, such as garlic and onions.  Disaccharides: Milk, yogurt and soft cheese. Lactose is the main carb.  Monosaccharides: Various fruit including figs and mangoes, and sweeteners such as honey and agave nectar. Fructose is the main carb.  Polyols: Certain fruits and vegetables including blackberries and lychee, as well as some low-calorie sweeteners like those in sugar-free gum.   Keep a food diary. This will help you identify foods that cause symptoms. Write down: ? What you eat and when. ? What symptoms you have. ? When symptoms occur in relation to your meals.  Avoid foods that cause symptoms. Talk with your dietitian about other ways to get the same nutrients that are in these foods.  Eat your meals slowly, in a relaxed setting.  Aim to eat 5-6 small meals per day. Do not skip meals.  Drink enough fluids to keep your urine clear or pale yellow.  Ask your health care provider if you should take an over-the-counter probiotic during flare-ups to help restore healthy gut bacteria.  If you have cramping or diarrhea, try making your meals low in fat and high in carbohydrates.  Examples of carbohydrates are pasta, rice, whole grain breads and cereals, fruits, and vegetables.  If dairy products cause your symptoms to flare up, try eating less of them. You might be able to handle yogurt better than other dairy products because it contains bacteria that help with digestion.

## 2020-07-26 LAB — CBC WITH DIFFERENTIAL/PLATELET
Absolute Monocytes: 512 cells/uL (ref 200–950)
Basophils Absolute: 65 cells/uL (ref 0–200)
Basophils Relative: 0.7 %
Eosinophils Absolute: 93 cells/uL (ref 15–500)
Eosinophils Relative: 1 %
HCT: 38.6 % (ref 35.0–45.0)
Hemoglobin: 12.7 g/dL (ref 11.7–15.5)
Lymphs Abs: 2492 cells/uL (ref 850–3900)
MCH: 29.1 pg (ref 27.0–33.0)
MCHC: 32.9 g/dL (ref 32.0–36.0)
MCV: 88.3 fL (ref 80.0–100.0)
MPV: 11.7 fL (ref 7.5–12.5)
Monocytes Relative: 5.5 %
Neutro Abs: 6138 cells/uL (ref 1500–7800)
Neutrophils Relative %: 66 %
Platelets: 322 10*3/uL (ref 140–400)
RBC: 4.37 10*6/uL (ref 3.80–5.10)
RDW: 12.2 % (ref 11.0–15.0)
Total Lymphocyte: 26.8 %
WBC: 9.3 10*3/uL (ref 3.8–10.8)

## 2020-07-26 LAB — COMPLETE METABOLIC PANEL WITH GFR
AG Ratio: 1.6 (calc) (ref 1.0–2.5)
ALT: 9 U/L (ref 6–29)
AST: 13 U/L (ref 10–30)
Albumin: 4.5 g/dL (ref 3.6–5.1)
Alkaline phosphatase (APISO): 57 U/L (ref 31–125)
BUN: 14 mg/dL (ref 7–25)
CO2: 26 mmol/L (ref 20–32)
Calcium: 9.2 mg/dL (ref 8.6–10.2)
Chloride: 103 mmol/L (ref 98–110)
Creat: 0.7 mg/dL (ref 0.50–1.10)
GFR, Est African American: 127 mL/min/{1.73_m2} (ref >=60)
GFR, Est Non African American: 110 mL/min/{1.73_m2} (ref >=60)
Globulin: 2.8 g/dL (calc) (ref 1.9–3.7)
Glucose, Bld: 99 mg/dL (ref 65–99)
Potassium: 4.2 mmol/L (ref 3.5–5.3)
Sodium: 137 mmol/L (ref 135–146)
Total Bilirubin: 0.4 mg/dL (ref 0.2–1.2)
Total Protein: 7.3 g/dL (ref 6.1–8.1)

## 2020-07-26 LAB — LIPID PANEL
Cholesterol: 191 mg/dL (ref <200)
HDL: 45 mg/dL — ABNORMAL LOW (ref >=50)
LDL Cholesterol (Calc): 129 mg/dL (calc) — ABNORMAL HIGH
Non-HDL Cholesterol (Calc): 146 mg/dL (calc) — ABNORMAL HIGH (ref <130)
Total CHOL/HDL Ratio: 4.2 (calc) (ref <5.0)
Triglycerides: 78 mg/dL (ref <150)

## 2020-07-26 LAB — TSH: TSH: 1.05 mIU/L

## 2020-07-26 LAB — VITAMIN D 25 HYDROXY (VIT D DEFICIENCY, FRACTURES): Vit D, 25-Hydroxy: 68 ng/mL (ref 30–100)

## 2020-07-28 IMAGING — CR RIGHT FOOT COMPLETE - 3+ VIEW
3 series · 3 of 3 positions shown · non-contrast
Comparison: None.

CLINICAL DATA: Twisted ankle, lateral pain

EXAM:
RIGHT ANKLE - COMPLETE 3+ VIEW; RIGHT FOOT COMPLETE - 3+ VIEW

[t foot lat right]
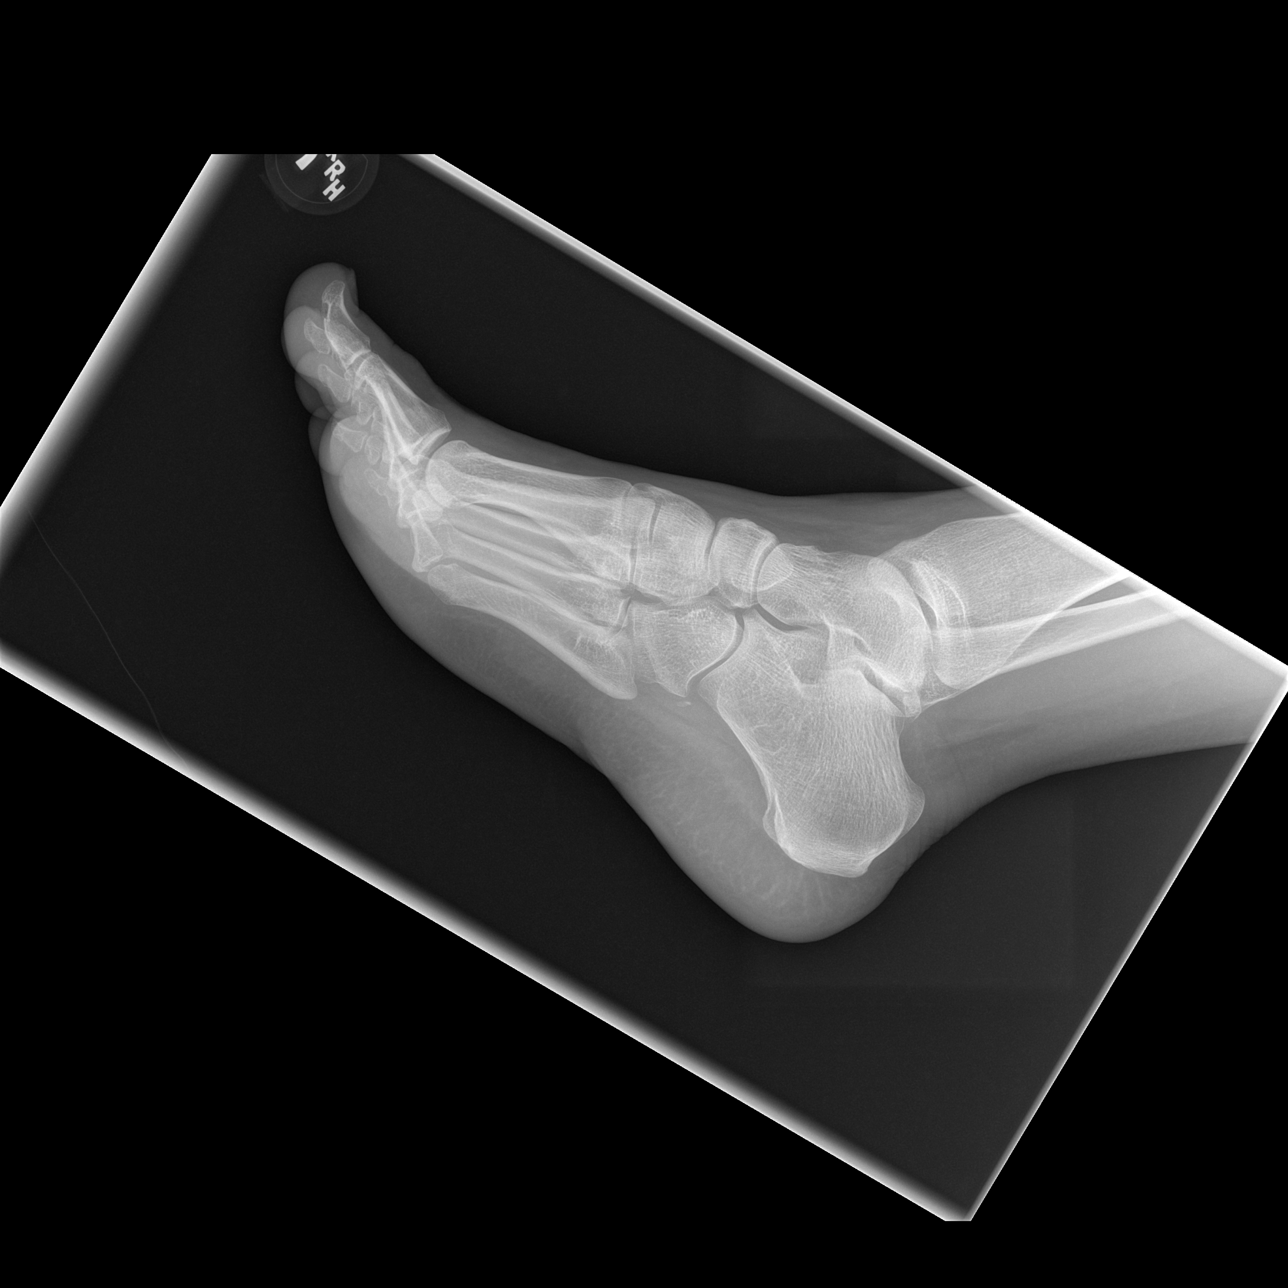

[t foot oblique right]
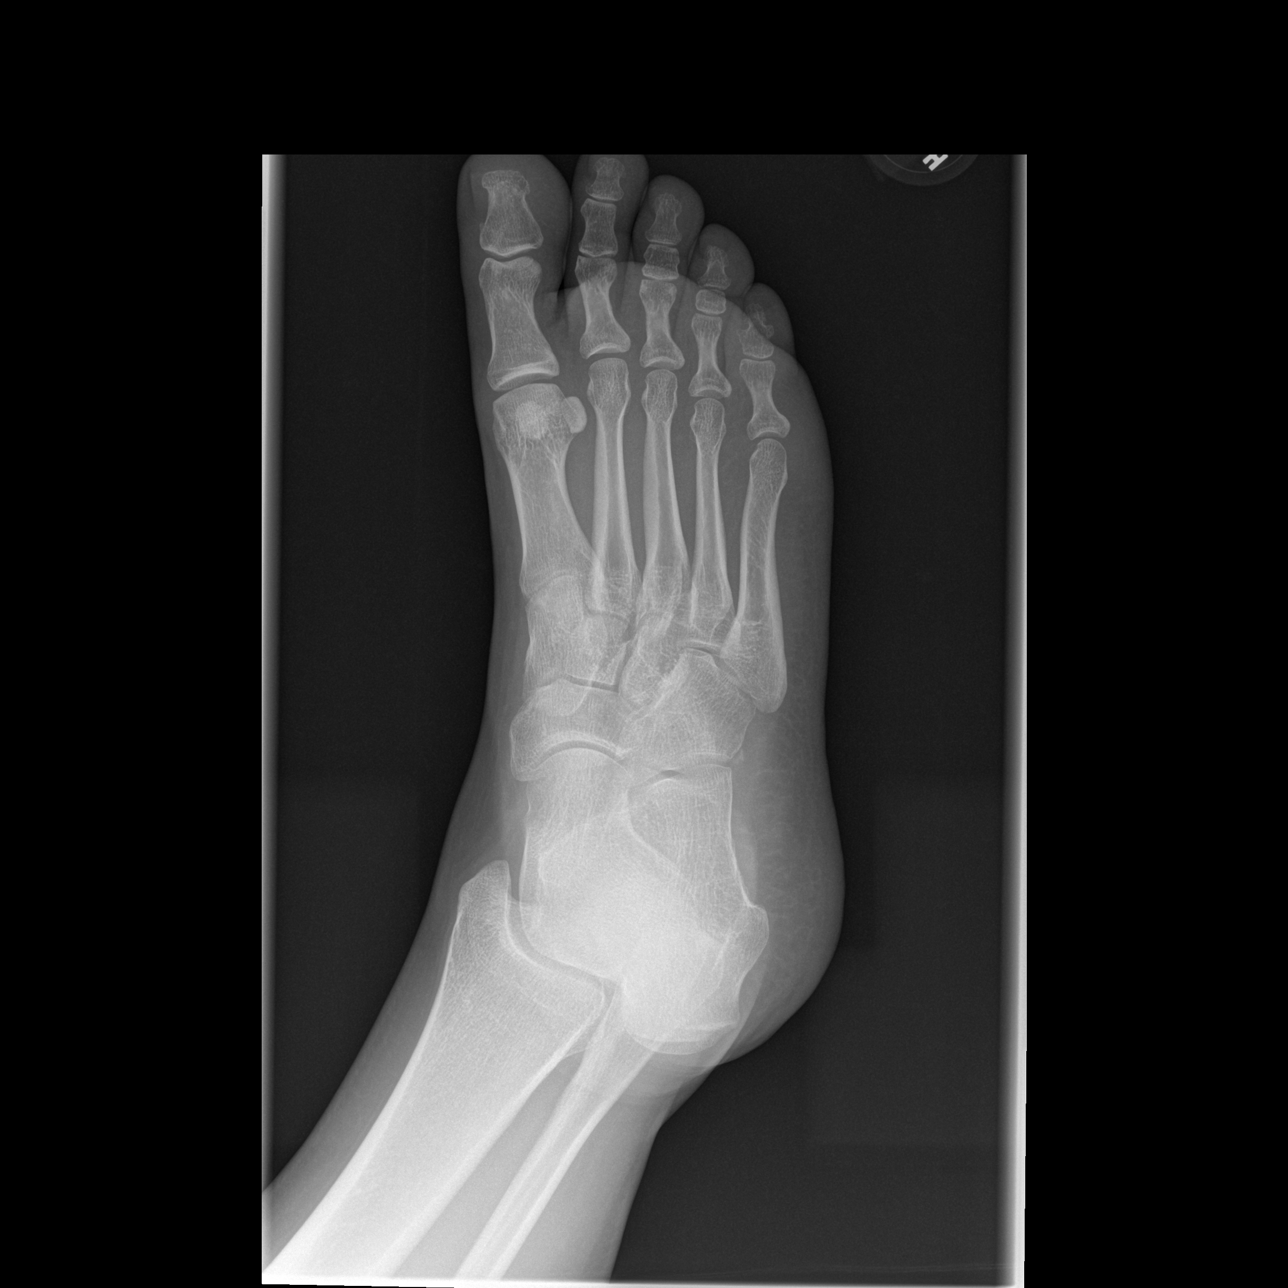

[t foot ap right]
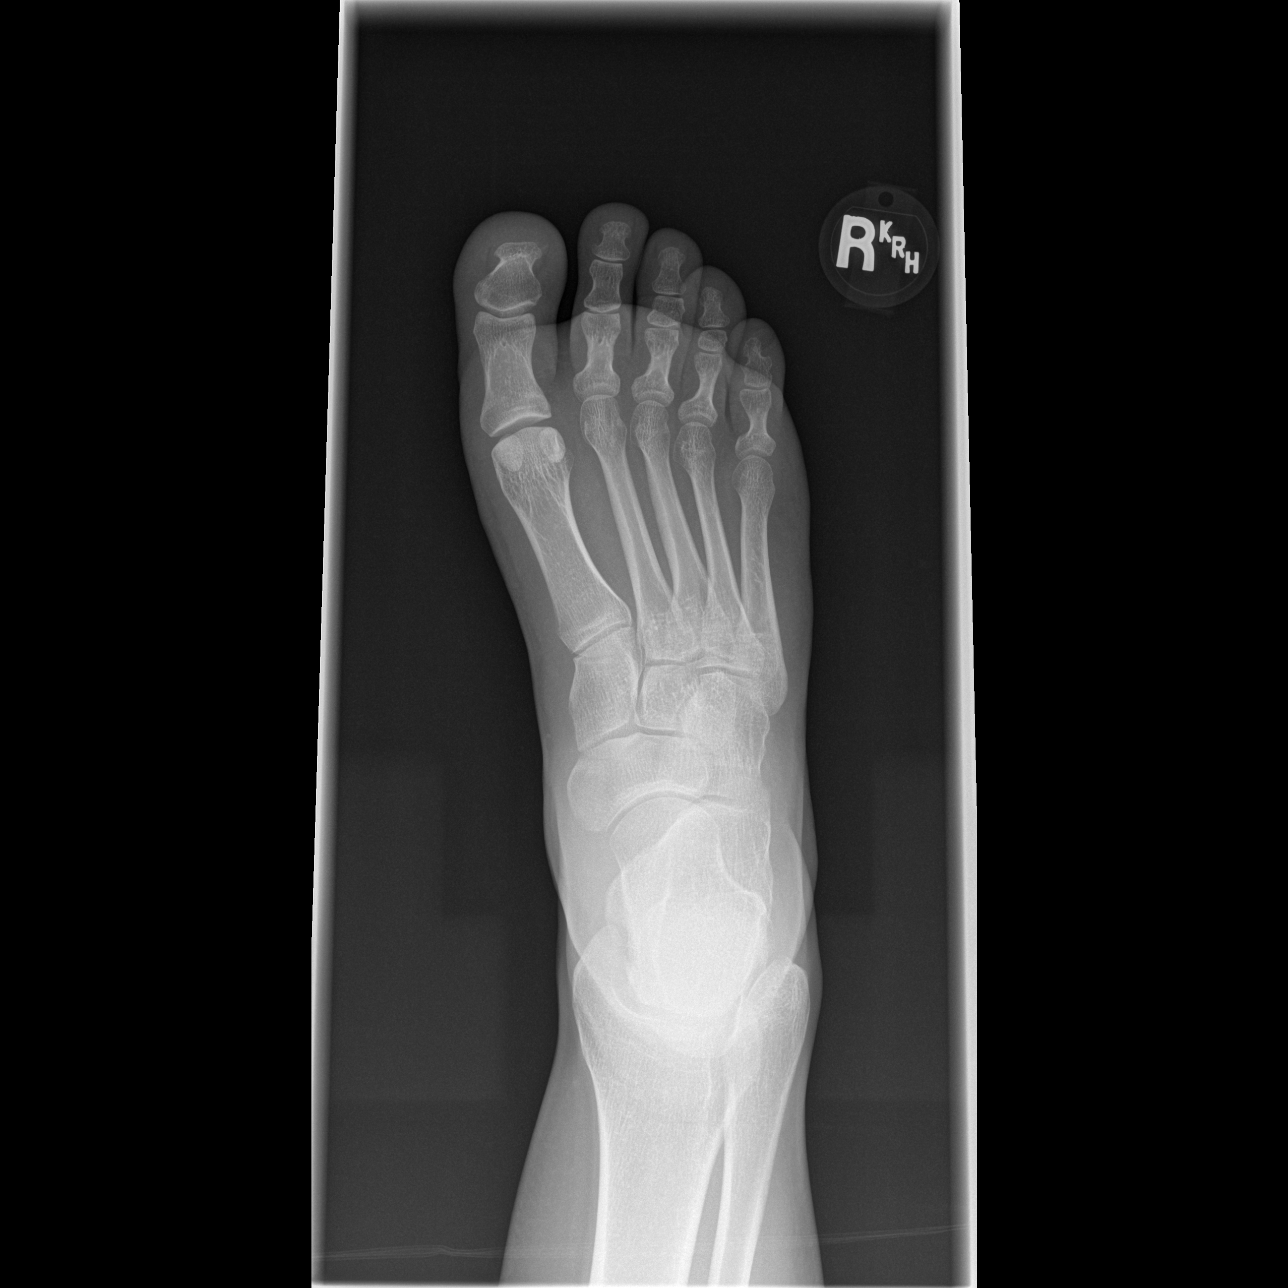

[3 of 3 positions shown; findings below may reference images not displayed]

FINDINGS: There is a tiny osseous body adjacent to the right cuboid,
consistent with an os peroneum and does not represent an avulsion
fracture. No evidence of fracture or dislocation of the right foot
or right ankle. Joint spaces are well preserved.
IMPRESSION: There is a tiny osseous body adjacent to the right cuboid,
consistent with an os peroneum and does not represent an avulsion
fracture. No evidence of fracture or dislocation of the right foot
or right ankle. Joint spaces are well preserved.

## 2020-08-12 ENCOUNTER — Other Ambulatory Visit: Payer: Self-pay | Admitting: Adult Health Nurse Practitioner

## 2020-08-19 ENCOUNTER — Other Ambulatory Visit: Payer: Self-pay | Admitting: Adult Health

## 2020-08-19 DIAGNOSIS — M5127 Other intervertebral disc displacement, lumbosacral region: Secondary | ICD-10-CM

## 2020-08-27 ENCOUNTER — Other Ambulatory Visit: Payer: Self-pay | Admitting: Internal Medicine

## 2020-08-27 DIAGNOSIS — O219 Vomiting of pregnancy, unspecified: Secondary | ICD-10-CM

## 2020-08-28 ENCOUNTER — Other Ambulatory Visit: Payer: Self-pay | Admitting: Adult Health

## 2020-08-28 DIAGNOSIS — O219 Vomiting of pregnancy, unspecified: Secondary | ICD-10-CM

## 2020-08-28 MED ORDER — ONDANSETRON 4 MG PO TBDP: ORAL_TABLET | ORAL | 0 refills | Status: DC

## 2020-10-07 ENCOUNTER — Other Ambulatory Visit: Payer: Self-pay | Admitting: Internal Medicine

## 2020-10-07 MED ORDER — DEXAMETHASONE 4 MG PO TABS: ORAL_TABLET | ORAL | 0 refills | Status: DC

## 2020-10-09 ENCOUNTER — Other Ambulatory Visit: Payer: Self-pay | Admitting: Internal Medicine

## 2021-01-27 ENCOUNTER — Encounter: Payer: 59 | Admitting: Adult Health

## 2021-02-06 ENCOUNTER — Ambulatory Visit (INDEPENDENT_AMBULATORY_CARE_PROVIDER_SITE_OTHER): Payer: 59 | Admitting: Adult Health

## 2021-02-06 ENCOUNTER — Encounter: Payer: Self-pay | Admitting: Adult Health

## 2021-02-06 ENCOUNTER — Other Ambulatory Visit: Payer: Self-pay

## 2021-02-06 VITALS — BP 104/78 | HR 81 | Temp 97.3°F | Ht 66.0 in | Wt 142.0 lb

## 2021-02-06 DIAGNOSIS — Z0001 Encounter for general adult medical examination with abnormal findings: Secondary | ICD-10-CM

## 2021-02-06 DIAGNOSIS — J4520 Mild intermittent asthma, uncomplicated: Secondary | ICD-10-CM

## 2021-02-06 DIAGNOSIS — E785 Hyperlipidemia, unspecified: Secondary | ICD-10-CM

## 2021-02-06 DIAGNOSIS — Z1389 Encounter for screening for other disorder: Secondary | ICD-10-CM

## 2021-02-06 DIAGNOSIS — Z85828 Personal history of other malignant neoplasm of skin: Secondary | ICD-10-CM

## 2021-02-06 DIAGNOSIS — Z1329 Encounter for screening for other suspected endocrine disorder: Secondary | ICD-10-CM

## 2021-02-06 DIAGNOSIS — Z0000 Encounter for general adult medical examination without abnormal findings: Secondary | ICD-10-CM | POA: Diagnosis not present

## 2021-02-06 DIAGNOSIS — Z131 Encounter for screening for diabetes mellitus: Secondary | ICD-10-CM

## 2021-02-06 DIAGNOSIS — E559 Vitamin D deficiency, unspecified: Secondary | ICD-10-CM

## 2021-02-06 DIAGNOSIS — Z79899 Other long term (current) drug therapy: Secondary | ICD-10-CM

## 2021-02-06 DIAGNOSIS — F338 Other recurrent depressive disorders: Secondary | ICD-10-CM

## 2021-02-06 DIAGNOSIS — T7840XD Allergy, unspecified, subsequent encounter: Secondary | ICD-10-CM

## 2021-02-06 DIAGNOSIS — Z6822 Body mass index (BMI) 22.0-22.9, adult: Secondary | ICD-10-CM

## 2021-02-06 MED ORDER — TRETINOIN 0.025 % EX CREA: TOPICAL_CREAM | Freq: Every day | CUTANEOUS | 0 refills | Status: DC

## 2021-02-06 NOTE — Progress Notes (Signed)
Complete Physical  Assessment and Plan:   Christie Le was seen today for annual exam.  Diagnoses and all orders for this visit:  Encounter for routine adult health examination with abnormal findings Due annually  Health Maintenance reviewed Healthy lifestyle reviewed and goals set  Hyperlipidemia, unspecified hyperlipidemia type Continue low cholesterol diet and exercise.  Check lipid panel.  -     Lipid panel -     TSH  Seasonal affective disorder (HCC) Continue medication; wellbutrin PRN Lifestyle discussed: diet/exerise, sleep hygiene, stress management, hydration -     TSH  Mild intermittent asthma without complication Inhaler PRN, avoid triggers -     CBC with Differential/Platelet  Allergy, subsequent encounter Continue OTC allergy pills  Medication management -     CBC with Differential/Platelet -     COMPLETE METABOLIC PANEL WITH GFR -     Magnesium -     Urinalysis, Routine w reflex microscopic  Vitamin D deficiency -     VITAMIN D 25 Hydroxy (Vit-D Deficiency, Fractures)  BMI 22 -  Excellent progress with [employer] Continue to recommend diet heavy in fruits and veggies and low in animal meats, cheeses, and dairy products, appropriate calorie intake High fiber handout given to incorporate once transitioning off of meal replacement program  Discuss exercise recommendations routinely Continue to monitor weight at each visit  Screening for hematuria or proteinuria -     Urinalysis, Routine w reflex microscopic  Screening for thyroid disorder -     TSH  Screening for diabetes mellitus -     Hemoglobin A1c  History of basal cell cancer Follow up skin surgery center  Other orders -     tretinoin (RETIN-A) 0.025 % cream; Apply topically at bedtime.  Discussed med's effects and SE's. Screening labs and tests as requested with regular follow-up as recommended. Future Appointments  Date Time Provider [place]  02/10/2022  2:00 PM Liane Comber, NP  [facility] None    HPI  40 y.o. female  presents for a complete physical. She has Allergies; Seasonal affective disorder ([place]); Vitamin D deficiency; Ulnar nerve entrapment, left; History of dysplastic nevus; Snoring; Non-restorative sleep; Hyperlipidemia; Overweight (BMI 25.0-29.9); Asthma; Inflamed skin tag; Herniation of intervertebral disc between L5 and S1; Medication management; and History of basal cell cancer on their problem list.  She married, has 39 year old twins, just started 6th grade. Also Christie Le, 2.39 y/o. Some stress with MIL, doing some marital counseling therapy that is helpful. 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   She follows with Dr. Benjie Karvonen at [facility] for [place], has mirena.   Hx of BCC of left breast wall removed at skin surgery in 2019.   She had sleep study that showed insufficient REM, non-restorative sleep. Didn't care for adderall. Takes Wellbutrin PRN for focus/seasonal affective which works well.   IBS - grease/stress, overall managing well, improved since starting [employer]avia.   She takes singulaire daily, breo/albuterol PRN for mild intermittent asthma. Certain smells and allergies trigger.   She has GERD, improved with lifestyle, off of PPI.   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 Working with [place], PT has been helpful.   BMI is Body mass index is 22.92 kg/m., she has been working on diet and exercise. She has done well with optavia , goal <135 lb. Aiming for 30 min of walking 3 days  a week.  Wt Readings from Last 3 Encounters:  02/06/21 142 lb (64.4 kg)  07/25/20 171 lb (77.6 kg)  04/03/20 168 lb (76.2 kg)   Her blood pressure has been controlled at home, today their BP is BP: 104/78 She does workout, 3-4 times a week. She denies chest pain, shortness of breath, dizziness.   She  is not on cholesterol medication and denies myalgias. Her cholesterol is at goal. The cholesterol last visit was:   Lab Results  Component Value Date   CHOL 191 07/25/2020   HDL 45 (L) 07/25/2020   LDLCALC 129 (H) 07/25/2020   TRIG 78 07/25/2020   CHOLHDL 4.2 07/25/2020   Last A1C in the office was:  Lab Results  Component Value Date   HGBA1C 5.4 06/28/2015   Patient is on Vitamin D supplement, she is on 5000 IU- has been on 10,000 last few weeks.    Lab Results  Component Value Date   VD25OH 68 07/25/2020     Current Medications:     Current Outpatient Medications (Respiratory):    albuterol (VENTOLIN HFA) 108 (90 Base) MCG/ACT inhaler, INHALE 1 TO 2 PUFFS BY MOUTH EVERY 4 HOURS FOR WHEEZING OR SHORTNESS OF BREATH   BREO ELLIPTA 100-25 MCG/INH AEPB, inhale 1 PUFF into THE lungs ONCE A DAY. RINSE MOUTH WITH WATER AFTER EACH USE   fluticasone (FLONASE) 50 MCG/ACT nasal spray, USE 2 SPRAYS IN EACH NOSTRIL EVERY DAY   montelukast (SINGULAIR) 10 MG tablet, TAKE 1 TABLET BY MOUTH EVERY DAY FOR allergies   pseudoephedrine (SUDAFED) 120 MG 12 hr tablet, Take   1 tablet   2 x /day (every 12 hours)    for Head and Chest Congestion  Current Outpatient Medications (Analgesics):    acetaminophen (TYLENOL) 500 MG tablet, Take 2 tablets (1,000 mg total) by mouth every 4 (four) hours as needed for mild pain.   aspirin EC 81 MG tablet, Take 81 mg by mouth daily. Swallow whole.   meloxicam (MOBIC) 15 MG tablet, Take  1/2 to 1 tablet  Daily with Food for Pain & Inflammation   Current Outpatient Medications (Other):    buPROPion (WELLBUTRIN XL) 150 MG 24 hr tablet, TAKE 2 TABLETS BY MOUTH EVERY MORNING (Patient taking differently: No sig reported)   Cholecalciferol (VITAMIN D3) 125 MCG (5000 UT) CAPS, Take 5,000 Units by mouth at bedtime.   magnesium oxide (MAG-OX) 400 MG tablet, Take 400 mg by mouth at bedtime.   ondansetron (ZOFRAN-ODT) 4 MG disintegrating tablet, Dissolve  1 tablet  under  tongue  3 x/day  as needed  for Nausea /Vomitting   OVER THE COUNTER MEDICATION, Apply 1 application topically at bedtime. Magneium cream(MG-12). Apply to feet at bedtime for restless legs.   tretinoin (RETIN-A) 0.025 % cream, Apply topically at bedtime.   valACYclovir (VALTREX) 1000 MG tablet, TAKE 2 TABLETS BY MOUTH 2 TIMES DAILY (Patient taking differently: as needed.)   baclofen (LIORESAL) 10 MG tablet, Take 0.5-1 tablets (5-10 mg total) by mouth 3 (three) times daily.   Omega 3-6-9 Fatty Acids (OMEGA 3-6-9 COMPLEX PO), Take by mouth daily. (Patient not taking: Reported on 02/06/2021)  Health Maintenance:   Immunization History  Administered Date(s) Administered   Influenza-Unspecified 11/21/2012   PFIZER(Purple Top)SARS-COV-2 Vaccination 05/12/2019, 06/02/2019, 02/20/2020   Tdap 05/10/2012, 01/18/2018   Tetanus: 2019 Pneumovax: N/A Flu vaccine: will get at pharmacy  Covid 19: 2/2 pfizer will get new booster  Pap: Dr. Lisbeth Renshaw- Mirena, neg PAP 06/29/2020 Hx abnormal  pap in 2018- colposcopy normal.   MGM: N/A  Colonoscopy: N/A EGD: N.A   Last Dental Exam: [facility], Dr. Sarajane Jews Last Eye Exam: None, has been to [facility] in the past  Patient Care Team: Unk Pinto, MD as PCP - General (Internal Medicine)  Medical History:  Past Medical History:  Diagnosis Date   Anemia    hx of   Asthma    no problems since pregnancy   Breech presentation 04/26/2018   Family history of anesthesia complication    "mom is sensitive- severe n/v"   Fibrocystic breast    Gallstones 09/25/2013   Gestational hypertension 04/26/2018   Pneumonia    age 58   PONV (postoperative nausea and vomiting)    Postpartum care following cesarean delivery (1/8) 04/27/2018   Previous cesarean section 04/26/2018   S/P laparoscopic cholecystectomy June 15 09/25/2013   Single small stone; chronic changes;  Small bile ducts    Seasonal allergies    Vitamin D deficiency    Allergies No Known  Allergies  SURGICAL HISTORY She  has a past surgical history that includes Cesarean section (07/29/2009); Wisdom tooth extraction (2002); Cholecystectomy (N/A, 09/25/2013); and Cesarean section (N/A, 04/26/2018). FAMILY HISTORY Her family history includes Basal cell carcinoma (age of onset: 79) in her mother; Breast cancer in her maternal grandmother; CAD in her paternal grandfather; COPD in her maternal grandmother; Cancer in her father; Dementia in her maternal grandfather; Diabetes in her father; Heart disease in her maternal grandmother; Hypertension in her maternal grandfather, mother, and another family member; Lung cancer in her maternal grandmother. SOCIAL HISTORY She  reports that she has never smoked. She has never used smokeless tobacco. She reports that she does not currently use alcohol. She reports that she does not use drugs.  Review of Systems: Review of Systems  Constitutional:  Negative for malaise/fatigue and weight loss.  HENT:  Negative for hearing loss and tinnitus.   Eyes:  Negative for blurred vision and double vision.  Respiratory:  Negative for cough, sputum production, shortness of breath and wheezing.   Cardiovascular:  Negative for chest pain, palpitations, orthopnea, claudication, leg swelling and PND.  Gastrointestinal:  Negative for abdominal pain, blood in stool, constipation, diarrhea, heartburn, melena, nausea and vomiting.  Genitourinary: Negative.   Musculoskeletal:  Positive for back pain (lumbar, intermittent). Negative for joint pain and myalgias.  Skin:  Negative for rash.  Neurological:  Negative for dizziness, tingling, sensory change, weakness and headaches.  Endo/Heme/Allergies:  Negative for polydipsia.  Psychiatric/Behavioral:  Positive for depression (seasonal). Negative for memory loss, substance abuse and suicidal ideas. The patient is not nervous/anxious and does not have insomnia.   All other systems reviewed and are negative.  Physical  Exam: Estimated body mass index is 22.92 kg/m as calculated from the following:   Height as of this encounter: 5\' 6"  (1.676 m).   Weight as of this encounter: 142 lb (64.4 kg). BP 104/78   Pulse 81   Temp (!) 97.3 F (36.3 C)   Ht 5\' 6"  (1.676 m)   Wt 142 lb (64.4 kg)   SpO2 99%   BMI 22.92 kg/m  General Appearance: Well nourished, in no apparent distress.  Eyes: PERRLA, EOMs, conjunctiva no swelling or erythema, normal fundi and vessels.  Sinuses: No Frontal/maxillary tenderness  ENT/Mouth: Ext aud canals clear, normal light reflex with TMs without erythema, bulging. Good dentition. No erythema, swelling, or exudate on post pharynx. Tonsils not enlarged. Hearing normal.  Neck: Supple,  thyroid normal. No bruits  Respiratory: Respiratory effort normal, BS equal bilaterally without rales, rhonchi, wheezing or stridor.  Cardio: RRR without murmurs, rubs or gallops. Brisk peripheral pulses without edema.  Chest: symmetric, with normal excursions and percussion.  Breasts: defer OB/GYN Abdomen: Soft, nontender, no guarding, rebound, hernias, masses, or organomegaly. .  Lymphatics: Non tender without lymphadenopathy.  Genitourinary: defer OB/GYN Musculoskeletal: Full ROM all peripheral extremities,5/5 strength, and normal gait.  Skin: Warm, dry without rashes, lesions, ecchymosis. Neuro: Cranial nerves intact, reflexes equal bilaterally. Normal muscle tone, no cerebellar symptoms. Sensation intact.  Psych: Awake and oriented X 3, normal affect, Insight and Judgment appropriate.   EKG: defer  Izora Ribas 2:45 PM [facility]

## 2021-02-06 NOTE — Patient Instructions (Addendum)
Christie Le , Thank you for taking time to come for your Annual Wellness Visit. I appreciate your ongoing commitment to your health goals. Please review the following plan we discussed and let me know if I can assist you in the future.   These are the goals we discussed:  Goals      LDL CALC < 100        This is a list of the screening recommended for you and due dates:  Health Maintenance  Topic Date Due   COVID-19 Vaccine (4 - Booster for Pfizer series) 04/16/2020   Flu Shot  11/18/2020   Pneumococcal Vaccination (1 - PCV) 02/06/2022*   Pap Smear  06/30/2023   Tetanus Vaccine  01/19/2028   Hepatitis C Screening: USPSTF Recommendation to screen - Ages 18-79 yo.  Completed   HIV Screening  Completed   HPV Vaccine  Aged Out  *Topic was postponed. The date shown is not the original due date.     Know what a healthy weight is for you (roughly BMI <25) and aim to maintain this  Aim for 7+ servings of fruits and vegetables daily  65-80+ fluid ounces of water or unsweet tea for healthy kidneys  Limit to max 1 drink of alcohol per day; avoid smoking/tobacco  Limit animal fats in diet for cholesterol and heart health - choose grass fed whenever available  Avoid highly processed foods, and foods high in saturated/trans fats  Aim for low stress - take time to unwind and care for your mental health  Aim for 150 min of moderate intensity exercise weekly for heart health, and weights twice weekly for bone health  Aim for 7-9 hours of sleep daily     High-Fiber Eating Plan Fiber, also called dietary fiber, is a type of carbohydrate. It is found foods such as fruits, vegetables, whole grains, and beans. A high-fiber diet can have many health benefits. Your health care provider may recommend a high-fiber diet to help: Prevent constipation. Fiber can make your bowel movements more regular. Lower your cholesterol. Relieve the following conditions: Inflammation of veins in the anus  (hemorrhoids). Inflammation of specific areas of the digestive tract (uncomplicated diverticulosis). A problem of the large intestine, also called the colon, that sometimes causes pain and diarrhea (irritable bowel syndrome, or IBS). Prevent overeating as part of a weight-loss plan. Prevent heart disease, type 2 diabetes, and certain cancers. What are tips for following this plan? Reading food labels  Check the nutrition facts label on food products for the amount of dietary fiber. Choose foods that have 5 grams of fiber or more per serving. The goals for recommended daily fiber intake include: Men (age 63 or younger): 34-38 g. Men (over age 58): 28-34 g. Women (age 73 or younger): 25-28 g. Women (over age 47): 22-25 g. Your daily fiber goal is _____________ g. Shopping Choose whole fruits and vegetables instead of processed forms, such as apple juice or applesauce. Choose a wide variety of high-fiber foods such as avocados, lentils, oats, and kidney beans. Read the nutrition facts label of the foods you choose. Be aware of foods with added fiber. These foods often have high sugar and sodium amounts per serving. Cooking Use whole-grain flour for baking and cooking. Cook with brown rice instead of white rice. Meal planning Start the day with a breakfast that is high in fiber, such as a cereal that contains 5 g of fiber or more per serving. Eat breads and cereals that are  made with whole-grain flour instead of refined flour or white flour. Eat brown rice, bulgur wheat, or millet instead of white rice. Use beans in place of meat in soups, salads, and pasta dishes. Be sure that half of the grains you eat each day are whole grains. General information You can get the recommended daily intake of dietary fiber by: Eating a variety of fruits, vegetables, grains, nuts, and beans. Taking a fiber supplement if you are not able to take in enough fiber in your diet. It is better to get fiber  through food than from a supplement. Gradually increase how much fiber you consume. If you increase your intake of dietary fiber too quickly, you may have bloating, cramping, or gas. Drink plenty of water to help you digest fiber. Choose high-fiber snacks, such as berries, raw vegetables, nuts, and popcorn. What foods should I eat? Fruits Berries. Pears. Apples. Oranges. Avocado. Prunes and raisins. Dried figs. Vegetables Sweet potatoes. Spinach. Kale. Artichokes. Cabbage. Broccoli. Cauliflower. Green peas. Carrots. Squash. Grains Whole-grain breads. Multigrain cereal. Oats and oatmeal. Brown rice. Barley. Bulgur wheat. [place]. Quinoa. Bran muffins. Popcorn. Rye wafer crackers. Meats and other proteins Navy beans, kidney beans, and pinto beans. Soybeans. Split peas. Lentils. Nuts and seeds. Dairy Fiber-fortified yogurt. Beverages Fiber-fortified soy milk. Fiber-fortified orange juice. Other foods Fiber bars. The items listed above may not be a complete list of recommended foods and beverages. Contact a dietitian for more information. What foods should I avoid? Fruits Fruit juice. Cooked, strained fruit. Vegetables Fried potatoes. Canned vegetables. Well-cooked vegetables. Grains White bread. Pasta made with refined flour. White rice. Meats and other proteins Fatty cuts of meat. Fried chicken or fried fish. Dairy Milk. Yogurt. Cream cheese. Sour cream. Fats and oils Butters. Beverages Soft drinks. Other foods Cakes and pastries. The items listed above may not be a complete list of foods and beverages to avoid. Talk with your dietitian about what choices are best for you. Summary Fiber is a type of carbohydrate. It is found in foods such as fruits, vegetables, whole grains, and beans. A high-fiber diet has many benefits. It can help to prevent constipation, lower blood cholesterol, aid weight loss, and reduce your risk of heart disease, diabetes, and certain cancers. Increase  your intake of fiber gradually. Increasing fiber too quickly may cause cramping, bloating, and gas. Drink plenty of water while you increase the amount of fiber you consume. The best sources of fiber include whole fruits and vegetables, whole grains, nuts, seeds, and beans. This information is not intended to replace advice given to you by your health care provider. Make sure you discuss any questions you have with your health care provider. Document Revised: 08/10/2019 Document Reviewed: 08/10/2019 Elsevier Patient Education  2022 [employer].

## 2021-02-07 LAB — COMPLETE METABOLIC PANEL WITH GFR
AG Ratio: 1.7 (calc) (ref 1.0–2.5)
ALT: 9 U/L (ref 6–29)
AST: 14 U/L (ref 10–30)
Albumin: 4.5 g/dL (ref 3.6–5.1)
Alkaline phosphatase (APISO): 52 U/L (ref 31–125)
BUN: 22 mg/dL (ref 7–25)
CO2: 27 mmol/L (ref 20–32)
Calcium: 9.7 mg/dL (ref 8.6–10.2)
Chloride: 103 mmol/L (ref 98–110)
Creat: 0.7 mg/dL (ref 0.50–0.97)
Globulin: 2.7 g/dL (calc) (ref 1.9–3.7)
Glucose, Bld: 87 mg/dL (ref 65–99)
Potassium: 4.3 mmol/L (ref 3.5–5.3)
Sodium: 139 mmol/L (ref 135–146)
Total Bilirubin: 0.6 mg/dL (ref 0.2–1.2)
Total Protein: 7.2 g/dL (ref 6.1–8.1)
eGFR: 113 mL/min/{1.73_m2} (ref >=60)

## 2021-02-07 LAB — MAGNESIUM: Magnesium: 2.3 mg/dL (ref 1.5–2.5)

## 2021-02-07 LAB — CBC WITH DIFFERENTIAL/PLATELET
Absolute Monocytes: 378 cells/uL (ref 200–950)
Basophils Absolute: 44 cells/uL (ref 0–200)
Basophils Relative: 0.5 %
Eosinophils Absolute: 70 cells/uL (ref 15–500)
Eosinophils Relative: 0.8 %
HCT: 38.6 % (ref 35.0–45.0)
Hemoglobin: 13 g/dL (ref 11.7–15.5)
Lymphs Abs: 2455 cells/uL (ref 850–3900)
MCH: 29.8 pg (ref 27.0–33.0)
MCHC: 33.7 g/dL (ref 32.0–36.0)
MCV: 88.5 fL (ref 80.0–100.0)
MPV: 12.7 fL — ABNORMAL HIGH (ref 7.5–12.5)
Monocytes Relative: 4.3 %
Neutro Abs: 5852 cells/uL (ref 1500–7800)
Neutrophils Relative %: 66.5 %
Platelets: 314 10*3/uL (ref 140–400)
RBC: 4.36 10*6/uL (ref 3.80–5.10)
RDW: 11.6 % (ref 11.0–15.0)
Total Lymphocyte: 27.9 %
WBC: 8.8 10*3/uL (ref 3.8–10.8)

## 2021-02-07 LAB — URINALYSIS, ROUTINE W REFLEX MICROSCOPIC
Bilirubin Urine: NEGATIVE
Glucose, UA: NEGATIVE
Hgb urine dipstick: NEGATIVE
Ketones, ur: NEGATIVE
Leukocytes,Ua: NEGATIVE
Nitrite: NEGATIVE
Protein, ur: NEGATIVE
Specific Gravity, Urine: 1.024 (ref 1.001–1.035)
pH: 7 (ref 5.0–8.0)

## 2021-02-07 LAB — TSH: TSH: 1.09 mIU/L

## 2021-02-07 LAB — LIPID PANEL
Cholesterol: 168 mg/dL (ref <200)
HDL: 37 mg/dL — ABNORMAL LOW (ref >=50)
LDL Cholesterol (Calc): 115 mg/dL (calc) — ABNORMAL HIGH
Non-HDL Cholesterol (Calc): 131 mg/dL (calc) — ABNORMAL HIGH (ref <130)
Total CHOL/HDL Ratio: 4.5 (calc) (ref <5.0)
Triglycerides: 65 mg/dL (ref <150)

## 2021-02-07 LAB — HEMOGLOBIN A1C
Hgb A1c MFr Bld: 5.2 % of total Hgb (ref <5.7)
Mean Plasma Glucose: 103 mg/dL
eAG (mmol/L): 5.7 mmol/L

## 2021-02-13 ENCOUNTER — Other Ambulatory Visit: Payer: Self-pay | Admitting: Adult Health

## 2021-03-17 ENCOUNTER — Other Ambulatory Visit: Payer: Self-pay | Admitting: Adult Health

## 2021-05-12 ENCOUNTER — Other Ambulatory Visit: Payer: Self-pay | Admitting: Nurse Practitioner

## 2021-05-14 ENCOUNTER — Encounter: Payer: Self-pay | Admitting: Adult Health

## 2021-05-14 ENCOUNTER — Ambulatory Visit: Payer: 59 | Admitting: Adult Health

## 2021-05-14 ENCOUNTER — Encounter: Payer: Self-pay | Admitting: Internal Medicine

## 2021-05-14 ENCOUNTER — Other Ambulatory Visit: Payer: Self-pay

## 2021-05-14 VITALS — BP 111/79 | HR 62 | Temp 97.9°F

## 2021-05-14 DIAGNOSIS — U071 COVID-19: Secondary | ICD-10-CM | POA: Insufficient documentation

## 2021-05-14 MED ORDER — PROMETHAZINE-DM 6.25-15 MG/5ML PO SYRP: 5.0000 mL | ORAL_SOLUTION | Freq: Four times a day (QID) | ORAL | 1 refills | Status: DC | PRN

## 2021-05-14 MED ORDER — BENZONATATE 100 MG PO CAPS
ORAL_CAPSULE | ORAL | 0 refills | Status: DC
Start: 2021-05-14 — End: 2021-11-04

## 2021-05-14 MED ORDER — TRETINOIN 0.05 % EX CREA: TOPICAL_CREAM | Freq: Every day | CUTANEOUS | 0 refills | Status: DC

## 2021-05-14 MED ORDER — PREDNISONE 20 MG PO TABS: ORAL_TABLET | ORAL | 0 refills | Status: DC

## 2021-05-14 NOTE — Progress Notes (Signed)
THIS ENCOUNTER IS A VIRTUAL VISIT DUE TO COVID-19 - PATIENT WAS NOT SEEN IN THE OFFICE.  PATIENT HAS CONSENTED TO VIRTUAL VISIT / TELEMEDICINE VISIT   Virtual Visit via telephone Note  I connected with  Gordy Levan on 05/14/2021 by telephone.  I verified that I am speaking with the correct person using two identifiers.    I discussed the limitations of evaluation and management by telemedicine and the availability of in person appointments. The patient expressed understanding and agreed to proceed.  History of Present Illness:  BP 111/79    Pulse 62    Temp 97.9 F (36.6 C)    SpO2 99%  41 y.o. patient with hx of allergies, asthma contacted office reporting URI sx and fatigue. she tested positive by rapid screen at home this AM. OV was conducted by telephone to minimize exposure. This patient was vaccinated for covid 19  She reports last week was feeling fatigued, generalized achiness, rhinitis, daughter tested + for covid 19 5 days ago, she started noting cough settling into chest yesterday and then tested + for covid 19 this AM. Denies fever/chills, dypsnea, CP, palpitations.   Treatments tried so far: robitussin DM, helping some   Asthma hx, uses breo intermittently/seasonally, hasn't used albuterol recently  Exposures: daughter tested + 5 days ago, patient tested + this AM   Medications    Current Outpatient Medications (Respiratory):    albuterol (VENTOLIN HFA) 108 (90 Base) MCG/ACT inhaler, INHALE 1 TO 2 PUFFS BY MOUTH EVERY 4 HOURS FOR WHEEZING OR SHORTNESS OF BREATH   BREO ELLIPTA 100-25 MCG/INH AEPB, inhale 1 PUFF into THE lungs ONCE A DAY. RINSE MOUTH WITH WATER AFTER EACH USE   fluticasone (FLONASE) 50 MCG/ACT nasal spray, USE 2 SPRAYS IN EACH NOSTRIL EVERY DAY   montelukast (SINGULAIR) 10 MG tablet, TAKE 1 TABLET BY MOUTH EVERY DAY FOR allergies   pseudoephedrine (SUDAFED) 120 MG 12 hr tablet, Take   1 tablet   2 x /day (every 12 hours)    for Head and Chest  Congestion (Patient not taking: Reported on 05/14/2021)  Current Outpatient Medications (Analgesics):    acetaminophen (TYLENOL) 500 MG tablet, Take 2 tablets (1,000 mg total) by mouth every 4 (four) hours as needed for mild pain.   aspirin EC 81 MG tablet, Take 81 mg by mouth daily. Swallow whole.   meloxicam (MOBIC) 15 MG tablet, Take  1/2 to 1 tablet  Daily with Food for Pain & Inflammation   Current Outpatient Medications (Other):    Cholecalciferol (VITAMIN D3) 125 MCG (5000 UT) CAPS, Take 5,000 Units by mouth at bedtime.   magnesium oxide (MAG-OX) 400 MG tablet, Take 400 mg by mouth at bedtime.   ondansetron (ZOFRAN-ODT) 4 MG disintegrating tablet, Dissolve  1 tablet  under tongue  3 x/day  as needed  for Nausea /Vomitting   OVER THE COUNTER MEDICATION, Apply 1 application topically at bedtime. Magneium cream(MG-12). Apply to feet at bedtime for restless legs.   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   Social History:   reports that she has never smoked. She has never used smokeless tobacco. She reports that she does not currently use alcohol. She reports that she does not use drugs.  Observations/Objective:  General : Well sounding patient in no apparent distress HEENT: no hoarseness, no cough for duration of visit Lungs: speaks in  complete sentences, no audible wheezing, no apparent distress Neurological: alert, oriented x 3 Psychiatric: pleasant, judgement appropriate   Assessment and Plan:  Covid 19 Covid 19 positive per rapid screening test at home Risk factors include: asthma Symptoms are: mild Steroid taper offered, she has breo and albuterol at home Immue support reviewed  Take tylenol PRN temp 101+ Push hydration Regular ambulation or calf exercises exercises for clot prevention and 81 mg ASA unless contraindicated Sx supportive therapy suggested Follow up via mychart or telephone if needed Advised patient obtain O2 monitor; present to ED if persistently <90% or with severe dyspnea, CP, fever uncontrolled by tylenol, confusion, sudden decline Should remain in isolation until at least 5 days from onset of sx, 24-48 hours fever free without tylenol, sx such as cough are improved.  -     predniSONE (DELTASONE) 20 MG tablet; 2 tablets daily for 3 days, 1 tablet daily for 4 days. -     promethazine-dextromethorphan (PROMETHAZINE-DM) 6.25-15 MG/5ML syrup; Take 5 mLs by mouth 4 (four) times daily as needed for cough. -     benzonatate (TESSALON) 100 MG capsule; Take 1-2 tab three times a day as needed for cough.  Other orders -     tretinoin (RETIN-A) 0.05 % cream; Apply topically at bedtime.   Follow Up Instructions:  I discussed the assessment and treatment plan with the patient. The patient was provided an opportunity to ask questions and all were answered. The patient agreed with the plan and demonstrated an understanding of the instructions.   The patient was advised to call back or seek an in-person evaluation if the symptoms worsen or if the condition fails to improve as anticipated.  I provided 20 minutes of non-face-to-face time during this encounter.   Izora Ribas, NP

## 2021-06-02 ENCOUNTER — Other Ambulatory Visit: Payer: Self-pay | Admitting: Adult Health

## 2021-06-14 ENCOUNTER — Other Ambulatory Visit: Payer: Self-pay | Admitting: Adult Health

## 2021-06-14 DIAGNOSIS — O219 Vomiting of pregnancy, unspecified: Secondary | ICD-10-CM

## 2021-06-16 ENCOUNTER — Other Ambulatory Visit: Payer: Self-pay | Admitting: Internal Medicine

## 2021-08-04 ENCOUNTER — Other Ambulatory Visit: Payer: Self-pay | Admitting: Adult Health

## 2021-08-06 ENCOUNTER — Other Ambulatory Visit: Payer: Self-pay | Admitting: Adult Health

## 2021-10-14 ENCOUNTER — Other Ambulatory Visit: Payer: Self-pay | Admitting: Internal Medicine

## 2021-11-04 ENCOUNTER — Other Ambulatory Visit: Payer: Self-pay | Admitting: Internal Medicine

## 2021-11-04 ENCOUNTER — Encounter: Payer: Self-pay | Admitting: Internal Medicine

## 2021-11-04 DIAGNOSIS — U071 COVID-19: Secondary | ICD-10-CM

## 2021-11-04 MED ORDER — DEXAMETHASONE 2 MG PO TABS: ORAL_TABLET | ORAL | 0 refills | Status: DC

## 2022-01-15 ENCOUNTER — Other Ambulatory Visit: Payer: Self-pay | Admitting: Nurse Practitioner

## 2022-01-19 ENCOUNTER — Emergency Department (HOSPITAL_BASED_OUTPATIENT_CLINIC_OR_DEPARTMENT_OTHER): Payer: 59 | Admitting: Radiology

## 2022-01-19 ENCOUNTER — Other Ambulatory Visit: Payer: Self-pay

## 2022-01-19 ENCOUNTER — Encounter (HOSPITAL_BASED_OUTPATIENT_CLINIC_OR_DEPARTMENT_OTHER): Payer: Self-pay

## 2022-01-19 ENCOUNTER — Emergency Department (HOSPITAL_BASED_OUTPATIENT_CLINIC_OR_DEPARTMENT_OTHER)
Admission: EM | Admit: 2022-01-19 | Discharge: 2022-01-19 | Disposition: A | Discharge status: Home / Self Care | Payer: 59 | Attending: Emergency Medicine | Admitting: Emergency Medicine

## 2022-01-19 DIAGNOSIS — Z7951 Long term (current) use of inhaled steroids: Secondary | ICD-10-CM | POA: Diagnosis not present

## 2022-01-19 DIAGNOSIS — Z7982 Long term (current) use of aspirin: Secondary | ICD-10-CM | POA: Insufficient documentation

## 2022-01-19 DIAGNOSIS — J45909 Unspecified asthma, uncomplicated: Secondary | ICD-10-CM | POA: Insufficient documentation

## 2022-01-19 DIAGNOSIS — R002 Palpitations: Secondary | ICD-10-CM | POA: Diagnosis present

## 2022-01-19 DIAGNOSIS — I493 Ventricular premature depolarization: Secondary | ICD-10-CM | POA: Insufficient documentation

## 2022-01-19 LAB — BASIC METABOLIC PANEL
Anion gap: 10 (ref 5–15)
BUN: 13 mg/dL (ref 6–20)
CO2: 24 mmol/L (ref 22–32)
Calcium: 9.6 mg/dL (ref 8.9–10.3)
Chloride: 105 mmol/L (ref 98–111)
Creatinine, Ser: 0.78 mg/dL (ref 0.44–1.00)
GFR, Estimated: >60 mL/min (ref >60)
Glucose, Bld: 104 mg/dL — ABNORMAL HIGH (ref 70–99)
Potassium: 3.4 mmol/L — ABNORMAL LOW (ref 3.5–5.1)
Sodium: 139 mmol/L (ref 135–145)

## 2022-01-19 LAB — CBC
HCT: 37.4 % (ref 36.0–46.0)
Hemoglobin: 12.8 g/dL (ref 12.0–15.0)
MCH: 30.2 pg (ref 26.0–34.0)
MCHC: 34.2 g/dL (ref 30.0–36.0)
MCV: 88.2 fL (ref 80.0–100.0)
Platelets: 296 10*3/uL (ref 150–400)
RBC: 4.24 MIL/uL (ref 3.87–5.11)
RDW: 12.1 % (ref 11.5–15.5)
WBC: 11.4 10*3/uL — ABNORMAL HIGH (ref 4.0–10.5)
nRBC: 0 % (ref 0.0–0.2)

## 2022-01-19 LAB — TROPONIN I (HIGH SENSITIVITY)
Troponin I (High Sensitivity): <2 ng/L (ref <18)
Troponin I (High Sensitivity): <2 ng/L (ref <18)

## 2022-01-19 LAB — HCG, SERUM, QUALITATIVE: Preg, Serum: NEGATIVE

## 2022-01-19 NOTE — ED Triage Notes (Signed)
Patient here POV from Home.  Endorses Palpitations and SOB for approximately 1 Week Some Associated CP.  Some Nausea and Indigestion. No Emesis.   NAD Noted during Triage. A&Ox4. GCS 15. Ambulatory.

## 2022-01-19 NOTE — ED Provider Notes (Signed)
[facility] [place] EMERGENCY DEPT Provider Note   CSN: [id number] Arrival date & time: 01/19/22  1635     History  Chief Complaint  Patient presents with   Palpitations    Christie Le is a 41 y.o. female.  Patient presents to the hospital complaining of 1 week of palpitations.  Patient states she has a history of a heart murmur and occasionally feels palpitations which last for a matter of minutes.  She states that 1 week ago patient was in a been ongoing for the past week.  She denies chest pain, shortness of breath at this time.  She does endorse mild nausea and some reflux-like symptoms.  She states she took some Maalox with mild relief of the reflux-like symptoms.  She denies emesis, abdominal pain, shortness of breath, urinary symptoms.  Past medical history significant for asthma, history of pneumonia  HPI     Home Medications Prior to Admission medications   Medication Sig Start Date End Date Taking? Authorizing Provider  acetaminophen (TYLENOL) 500 MG tablet Take 2 tablets (1,000 mg total) by mouth every 4 (four) hours as needed for mild pain. 04/30/18   Juliene Pina, CNM  albuterol (VENTOLIN HFA) 108 (90 Base) MCG/ACT inhaler INHALE 1 TO 2 PUFFS BY MOUTH EVERY 4 HOURS FOR WHEEZING OR SHORTNESS OF BREATH 01/15/22   Alycia Rossetti, NP  aspirin EC 81 MG tablet Take 81 mg by mouth daily. Swallow whole.    [provider]  BREO ELLIPTA 100-25 MCG/INH AEPB inhale 1 PUFF into THE lungs ONCE A DAY. RINSE MOUTH WITH WATER AFTER [facility] USE 07/04/18   Vladimir Crofts, PA-C  buPROPion (WELLBUTRIN XL) 150 MG 24 hr tablet TAKE 2 TABLETS BY MOUTH EVERY MORNING 06/16/21   Alycia Rossetti, NP  Cholecalciferol (VITAMIN D3) 125 MCG (5000 UT) CAPS Take 5,000 Units by mouth at bedtime.    [provider]  dexamethasone (DECADRON) 2 MG tablet Take 1 tab 3 x day - 3 days, then 2 x day - 3 days, then 1 tab daily 11/04/21   Unk Pinto, MD  fluticasone [facility]) 50  MCG/ACT nasal spray USE 2 SPRAYS IN [facility] NOSTRIL EVERY DAY 08/06/21   Alycia Rossetti, NP  magnesium oxide (MAG-OX) 400 MG tablet Take 400 mg by mouth at bedtime.    [provider]  meloxicam (MOBIC) 15 MG tablet Take  1/2 to 1 tablet  Daily with Food for Pain & Inflammation 08/19/20   Unk Pinto, MD  montelukast (SINGULAIR) 10 MG tablet TAKE 1 TABLET BY MOUTH EVERY DAY FOR allergies 08/06/21   Alycia Rossetti, NP  ondansetron (ZOFRAN-ODT) 4 MG disintegrating tablet DISSOLVE ONE TABLET UNDER THE TONGUE 3 TIMES DAILY AS NEEDED FOR NAUSEA AND VOMITING 06/14/21   Unk Pinto, MD  OVER THE COUNTER MEDICATION Apply 1 application topically at bedtime. Magneium cream(MG-12). Apply to feet at bedtime for restless legs.    [provider]  tretinoin (RETIN-A) 0.05 % cream apply topically TO THE FACE AT BEDTIME 06/02/21   Unk Pinto, MD  valACYclovir (VALTREX) 1000 MG tablet TAKE 2 TABLETS BY MOUTH 2 TIMES DAILY Patient taking differently: as needed. 06/17/20   Liane Comber, NP      Allergies    Patient has no known allergies.    Review of Systems   Review of Systems  Constitutional:  Negative for fatigue and fever.  Respiratory:  Negative for shortness of breath.   Cardiovascular:  Positive for palpitations. Negative  for chest pain.  Gastrointestinal:  Positive for nausea. Negative for abdominal pain, constipation, diarrhea and vomiting.       "Heartburn"  Genitourinary:  Negative for dysuria.  Neurological:  Negative for light-headedness and headaches.    Physical Exam Updated Vital Signs BP 124/72   Pulse 75   Temp 98.7 F (37.1 C)   Resp 12   Ht '5\' 6"'$  (1.676 m)   Wt 64.4 kg   SpO2 99%   BMI 22.92 kg/m  Physical Exam Vitals and nursing note reviewed.  Constitutional:      General: She is not in acute distress.    Appearance: She is well-developed.  HENT:     Head: Normocephalic and atraumatic.  Eyes:     Conjunctiva/sclera: Conjunctivae  normal.  Cardiovascular:     Rate and Rhythm: Normal rate and regular rhythm.     Heart sounds: No murmur heard. Pulmonary:     Effort: Pulmonary effort is normal. No respiratory distress.     Breath sounds: Normal breath sounds.  Abdominal:     Palpations: Abdomen is soft.  Musculoskeletal:        General: No swelling.     Cervical back: Neck supple.  Skin:    General: Skin is warm and dry.     Capillary Refill: Capillary refill takes less than 2 seconds.  Neurological:     Mental Status: She is alert.  Psychiatric:        Mood and Affect: Mood normal.     ED Results / Procedures / Treatments   Labs (all labs ordered are listed, but only abnormal results are displayed) Labs Reviewed  BASIC METABOLIC PANEL - Abnormal; Notable for the following components:      Result Value   Potassium 3.4 (*)    Glucose, Bld 104 (*)    All other components within normal limits  CBC - Abnormal; Notable for the following components:   WBC 11.4 (*)    All other components within normal limits  HCG, SERUM, QUALITATIVE  TROPONIN I (HIGH SENSITIVITY)  TROPONIN I (HIGH SENSITIVITY)    EKG None  Radiology DG Chest 2 View  Result Date: 01/19/2022 CLINICAL DATA:  Provided history: Shortness of breath. Additional history provided: Patient reports palpitations and shortness of breath for 1 week. Associated chest pain. Nausea and indigestion. EXAM: CHEST - 2 VIEW COMPARISON:  Prior chest radiographs 11/09/2016 and earlier FINDINGS: Heart size within normal limits. No appreciable airspace consolidation. No evidence of pleural effusion or pneumothorax. No acute bony abnormality identified. IMPRESSION: No evidence of active cardiopulmonary disease. Electronically Signed   By: Kellie Simmering D.O.   On: 01/19/2022 17:20    Procedures Procedures    Medications Ordered in ED Medications - No data to display  ED Course/ Medical Decision Making/ A&P                           Medical Decision  Making Amount and/or Complexity of Data Reviewed Labs: ordered. Radiology: ordered.   This patient presents to the ED for concern of palpitations, this involves an extensive number of treatment options, and is a complaint that carries with it a high risk of complications and morbidity.  The differential diagnosis includes arrhythmia, ACS, PE, and others   Co morbidities that complicate the patient evaluation  History of heart murmur   Additional history obtained:   External records from outside source obtained and reviewed including outpatient visit recently  for acute bacterial sinusitis   Lab Tests:  I Ordered, and personally interpreted labs.  The pertinent results include: Troponin less than 2, potassium 3.4, WBC 11.4   Imaging Studies ordered:  I ordered imaging studies including Chest x-ray  I independently visualized and interpreted imaging which showed no acute abnormality I agree with the radiologist interpretation   Cardiac Monitoring: / EKG:  The patient was maintained on a cardiac monitor.  I personally viewed and interpreted the cardiac monitored which showed an underlying rhythm of: Normal sinus rhythm  I did watch the monitor while the patient was talking to me and would catch a sporadic PVC on occasion.  These were somewhat random and were as infrequent as once a minute   Test / Admission - Considered:  The patient does have occasional PVCs on the EKG.  She has no chest pain and has negative troponin.  No ischemic changes on EKG.  No sign of ACS.  No shortness of breath to suggest pulmonary embolism.  Patient has an arrhythmia.  Plan to discharge patient home at this time and have her follow-up as an outpatient with cardiology.  Patient understands and is in agreement with the plan.  Return precautions provided        Final Clinical Impression(s) / ED Diagnoses Final diagnoses:  PVC (premature ventricular contraction)    Rx / DC Orders ED  Discharge Orders          Ordered    Ambulatory referral to Cardiology       Comments: If you have not heard from the Cardiology office within the next 72 hours please call [phone].   01/19/22 1913              Ronny Bacon 01/19/22 1913    Cristie Hem, MD 01/20/22 [phone]

## 2022-01-19 NOTE — Discharge Instructions (Addendum)
You were seen today for palpitations and premature ventricular contractions were noted on your EKG.  I have placed a referral to cardiology for further follow-up.  If you do not hear from them please contact the number listed after 72 hours to schedule an appointment.  If you develop chest pain, shortness of breath, or other life-threatening conditions, please return to the emergency department for reevaluation.

## 2022-02-10 ENCOUNTER — Encounter: Payer: Managed Care, Other (non HMO) | Admitting: Nurse Practitioner

## 2022-02-10 ENCOUNTER — Encounter: Payer: Self-pay | Admitting: Cardiovascular Disease

## 2022-02-10 ENCOUNTER — Ambulatory Visit: Payer: Managed Care, Other (non HMO) | Attending: Cardiovascular Disease | Admitting: Cardiovascular Disease

## 2022-02-10 DIAGNOSIS — E782 Mixed hyperlipidemia: Secondary | ICD-10-CM

## 2022-02-10 DIAGNOSIS — R002 Palpitations: Secondary | ICD-10-CM | POA: Diagnosis not present

## 2022-02-10 NOTE — Progress Notes (Signed)
02/10/2022 Christie Le   [birth year]  [id number]  Primary Physician Christie Pinto, MD Primary Cardiologist: Christie Harp MD Christie Le, [place]  HPI:  Christie Le is a 41 y.o. mildly overweight married Caucasian female mother of 3 children referred by the emergency room after recent evaluation for palpitations.  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  She is under a lot of stress at work.  She has no cardiac risk factors.  She does have reactive airways disease.  There is no family history for heart disease.  She is never had a heart attack or stroke.  She denies chest pain or shortness of breath.  She was seen in the ER 3 weeks ago with palpitations with a negative work-up although there were PVCs noted on telemetry.  Since decreasing her caffeine intake and reducing stress her palpitations as difficulty improved over the last several weeks.   Current Meds  Medication Sig   acetaminophen (TYLENOL) 500 MG tablet Take 2 tablets (1,000 mg total) by mouth every 4 (four) hours as needed for mild pain.   albuterol (VENTOLIN HFA) 108 (90 Base) MCG/ACT inhaler INHALE 1 TO 2 PUFFS BY MOUTH EVERY 4 HOURS FOR WHEEZING OR SHORTNESS OF BREATH   aspirin EC 81 MG tablet Take 81 mg by mouth daily. Swallow whole.   BREO ELLIPTA 100-25 MCG/INH AEPB inhale 1 PUFF into THE lungs ONCE A DAY. RINSE MOUTH WITH WATER AFTER EACH USE   buPROPion (WELLBUTRIN XL) 150 MG 24 hr tablet TAKE 2 TABLETS BY MOUTH EVERY MORNING   Cholecalciferol (VITAMIN D3) 125 MCG (5000 UT) CAPS Take 5,000 Units by mouth at bedtime.   fluticasone (FLONASE) 50 MCG/ACT nasal spray USE 2 SPRAYS IN EACH NOSTRIL EVERY DAY   magnesium oxide (MAG-OX) 400 MG tablet Take 400 mg by mouth at bedtime.   meloxicam (MOBIC) 15 MG tablet Take  1/2 to 1 tablet  Daily with Food for Pain & Inflammation   montelukast (SINGULAIR) 10 MG tablet TAKE 1 TABLET BY MOUTH EVERY DAY FOR allergies    ondansetron (ZOFRAN-ODT) 4 MG disintegrating tablet DISSOLVE ONE TABLET UNDER THE TONGUE 3 TIMES DAILY AS NEEDED FOR NAUSEA AND VOMITING   OVER THE COUNTER MEDICATION Apply 1 application topically at bedtime. Magneium cream(MG-12). Apply to feet at bedtime for restless legs.   tretinoin (RETIN-A) 0.05 % cream apply topically TO THE FACE AT BEDTIME   valACYclovir (VALTREX) 1000 MG tablet TAKE 2 TABLETS BY MOUTH 2 TIMES DAILY (Patient taking differently: as needed.)     No Known Allergies  Social History   Socioeconomic History   Marital status: Married    Spouse name: Not on file   Number of children: Not on file   Years of education: Not on file   Highest education level: Not on file  Occupational History   Not on file  Tobacco Use   Smoking status: Never   Smokeless tobacco: Never  Vaping Use   Vaping Use: Never used  Substance and Sexual Activity   Alcohol use: Not Currently    Comment: socially, occasional,   Drug use: No   Sexual activity: Yes    Partners: Male    Birth control/protection: I.U.D.  Other Topics Concern   Not on file  Social History Narrative   Not on file   Social Determinants of Health   Financial Resource Strain: Low Risk  (04/26/2018)   Overall Financial Resource  Strain (CARDIA)    Difficulty of Paying Living Expenses: Not hard at all  Food Insecurity: No Food Insecurity (04/26/2018)   Hunger Vital Sign    Worried About Running Out of Food in the Last Year: Never true    Ran Out of Food in the Last Year: Never true  Transportation Needs: Unknown (04/26/2018)   PRAPARE - Hydrologist (Medical): No    Lack of Transportation (Non-Medical): Not on file  Physical Activity: Not on file  Stress: Stress Concern Present (04/26/2018)   [place]    Feeling of Stress : To some extent  Social Connections: Not on file  Intimate Partner Violence: Not At Risk (04/26/2018)    Humiliation, Afraid, Rape, and Kick questionnaire    Fear of Current or Ex-Partner: No    Emotionally Abused: No    Physically Abused: No    Sexually Abused: No     Review of Systems: General: negative for chills, fever, night sweats or weight changes.  Cardiovascular: negative for chest pain, dyspnea on exertion, edema, orthopnea, palpitations, paroxysmal nocturnal dyspnea or shortness of breath Dermatological: negative for rash Respiratory: negative for cough or wheezing Urologic: negative for hematuria Abdominal: negative for nausea, vomiting, diarrhea, bright red blood per rectum, melena, or hematemesis Neurologic: negative for visual changes, syncope, or dizziness All other systems reviewed and are otherwise negative except as noted above.    Blood pressure 120/78, pulse 60, height '5\' 6"'$  (1.676 m), weight 155 lb (70.3 kg), SpO2 97 %, unknown if currently breastfeeding.  General appearance: alert and no distress Neck: no adenopathy, no carotid bruit, no JVD, supple, symmetrical, trachea midline, and thyroid not enlarged, symmetric, no tenderness/mass/nodules Lungs: clear to auscultation bilaterally Heart: regular rate and rhythm, S1, S2 normal, no murmur, click, rub or gallop Extremities: extremities normal, atraumatic, no cyanosis or edema Pulses: 2+ and symmetric Skin: Skin color, texture, turgor normal. No rashes or lesions Neurologic: Grossly normal  EKG sinus rhythm at 60 without ST or T wave changes.  Personally reviewed this EKG.  ASSESSMENT AND PLAN:   Hyperlipidemia History of mild hyperlipidemia not on statin therapy with lipid profile performed 02/06/2021 revealing total cholesterol 168, LDL of 115 and HDL of 37.  At this point, I do not feel compelled to start statin therapy.  Palpitations Christie Le was referred by the emergency room where she was recently seen for palpitations.  Her work-up was unrevealing.  There were PVCs on the monitor.  She says she gets  baseline palpitations prior to the ER visit during times of stress.  She has reduced her caffeine intake.  Her palpitations have markedly improved since her ER visit.  At this point, I do not feel compelled to obtain an a Zio patch or 2D echo.  Should her palpitations become more frequent and/or clinically noticeable, we will pursue a more thorough work-up.     Christie Harp MD FACP,FACC,FAHA, FSCAI 02/10/2022 10:00 AM

## 2022-02-10 NOTE — Assessment & Plan Note (Signed)
History of mild hyperlipidemia not on statin therapy with lipid profile performed 02/06/2021 revealing total cholesterol 168, LDL of 115 and HDL of 37.  At this point, I do not feel compelled to start statin therapy.

## 2022-02-10 NOTE — Patient Instructions (Signed)
Medication Instructions:  Your physician recommends that you continue on your current medications as directed. Please refer to the Current Medication list given to you today.  *If you need a refill on your cardiac medications before your next appointment, please call your pharmacy*   Follow-Up: At [facility], you and your health needs are our priority.  As part of our continuing mission to provide you with exceptional heart care, we have created designated Provider Care Teams.  These Care Teams include your primary Cardiologist (physician) and Advanced Practice Providers (APPs -  Physician Assistants and Nurse Practitioners) who all work together to provide you with the care you need, when you need it.  We recommend signing up for the patient portal called "MyChart".  Sign up information is provided on this After Visit Summary.  MyChart is used to connect with patients for Virtual Visits (Telemedicine).  Patients are able to view lab/test results, encounter notes, upcoming appointments, etc.  Non-urgent messages can be sent to your provider as well.   To learn more about what you can do with MyChart, go to [email or web address].    Your next appointment:   6 month(s)  The format for your next appointment:   In Person  Provider:   Jonathan Berry, MD  

## 2022-02-10 NOTE — Assessment & Plan Note (Signed)
Ms. Bendix was referred by the emergency room where she was recently seen for palpitations.  Her work-up was unrevealing.  There were PVCs on the monitor.  She says she gets baseline palpitations prior to the ER visit during times of stress.  She has reduced her caffeine intake.  Her palpitations have markedly improved since her ER visit.  At this point, I do not feel compelled to obtain an a Zio patch or 2D echo.  Should her palpitations become more frequent and/or clinically noticeable, we will pursue a more thorough work-up.

## 2022-03-27 ENCOUNTER — Encounter: Payer: Self-pay | Admitting: Nurse Practitioner

## 2022-03-27 ENCOUNTER — Ambulatory Visit (INDEPENDENT_AMBULATORY_CARE_PROVIDER_SITE_OTHER): Payer: Managed Care, Other (non HMO) | Admitting: Nurse Practitioner

## 2022-03-27 VITALS — BP 110/82 | HR 66 | Temp 97.5°F | Ht 66.0 in | Wt 170.6 lb

## 2022-03-27 DIAGNOSIS — E785 Hyperlipidemia, unspecified: Secondary | ICD-10-CM

## 2022-03-27 DIAGNOSIS — Z131 Encounter for screening for diabetes mellitus: Secondary | ICD-10-CM | POA: Diagnosis not present

## 2022-03-27 DIAGNOSIS — E559 Vitamin D deficiency, unspecified: Secondary | ICD-10-CM | POA: Diagnosis not present

## 2022-03-27 DIAGNOSIS — Z1329 Encounter for screening for other suspected endocrine disorder: Secondary | ICD-10-CM

## 2022-03-27 DIAGNOSIS — Z1389 Encounter for screening for other disorder: Secondary | ICD-10-CM | POA: Diagnosis not present

## 2022-03-27 DIAGNOSIS — Z1231 Encounter for screening mammogram for malignant neoplasm of breast: Secondary | ICD-10-CM

## 2022-03-27 DIAGNOSIS — Z0000 Encounter for general adult medical examination without abnormal findings: Secondary | ICD-10-CM

## 2022-03-27 DIAGNOSIS — Z79899 Other long term (current) drug therapy: Secondary | ICD-10-CM

## 2022-03-27 DIAGNOSIS — R5382 Chronic fatigue, unspecified: Secondary | ICD-10-CM

## 2022-03-27 DIAGNOSIS — F338 Other recurrent depressive disorders: Secondary | ICD-10-CM

## 2022-03-27 DIAGNOSIS — E663 Overweight: Secondary | ICD-10-CM

## 2022-03-27 DIAGNOSIS — J4520 Mild intermittent asthma, uncomplicated: Secondary | ICD-10-CM

## 2022-03-27 DIAGNOSIS — T7840XD Allergy, unspecified, subsequent encounter: Secondary | ICD-10-CM

## 2022-03-27 DIAGNOSIS — Z85828 Personal history of other malignant neoplasm of skin: Secondary | ICD-10-CM

## 2022-03-27 DIAGNOSIS — Z0001 Encounter for general adult medical examination with abnormal findings: Secondary | ICD-10-CM

## 2022-03-27 DIAGNOSIS — Z13220 Encounter for screening for lipoid disorders: Secondary | ICD-10-CM

## 2022-03-27 MED ORDER — ZEPBOUND 2.5 MG/0.5ML ~~LOC~~ SOAJ: 2.5000 mg | SUBCUTANEOUS | 2 refills | Status: DC

## 2022-03-27 MED ORDER — WEGOVY 0.25 MG/0.5ML ~~LOC~~ SOAJ: 0.2500 mg | SUBCUTANEOUS | 1 refills | Status: DC

## 2022-03-27 NOTE — Progress Notes (Signed)
Complete Physical  Assessment and Plan:  Christie Le was seen today for annual exam.  Diagnoses and all orders for this visit:  Encounter for routine adult health examination with abnormal findings Due annually  Health Maintenance reviewed Healthy lifestyle reviewed and goals set  Hyperlipidemia, unspecified hyperlipidemia type Discussed lifestyle modifications. Recommended diet heavy in fruits and veggies, omega 3's. Decrease consumption of animal meats, cheeses, and dairy products. Remain active and exercise as tolerated. Continue to monitor. Check lipids/TSH  Seasonal affective disorder (HCC) Restart Wellbutrin 150 mg SR daily Lifestyle discussed: diet/exerise, sleep hygiene, stress management, hydration  Mild intermittent asthma without complication Continue inhaler PRN, avoid triggers  Allergy, subsequent encounter Continue OTC allergy pills Avoid triggers  Medication management All medications discussed and reviewed in full. All questions and concerns regarding medications addressed.     Vitamin D deficiency Continue supplement Monitor levels   Overweight  Start Wegovy if insurance covers weight loss. Possible switch to Zepbound Discussed appropriate BMI Diet modification. Physical activity. Encouraged/praised to build confidence.   Screening for hematuria or proteinuria -     Urinalysis, Routine w reflex microscopic  Screening for thyroid disorder -     TSH  Screening for diabetes mellitus -     Hemoglobin A1c  History of basal cell cancer Follow up skin surgery center  Screening for breast cancer Mammogram  Screening for blood or proteinuria Monitor UA/Microalbumin  LMP:    Discussed med's effects and SE's. Screening labs and tests as requested with regular follow-up as recommended. Future Appointments  Date Time Provider [place]  03/30/2023 10:00 AM Darrol Jump, NP [facility] None    HPI  41 y.o. female  presents for a  complete physical. Christie Le has Allergies; Seasonal affective disorder ([place]); Vitamin D deficiency; Ulnar nerve entrapment, left; History of dysplastic nevus; Snoring; Non-restorative sleep; Hyperlipidemia; Asthma; Inflamed skin tag; Herniation of intervertebral disc between L5 and S1; Medication management; History of basal cell cancer; COVID-19; and Palpitations on their problem list.  Christie Le married, with 36 old twins daughters, also Christie Le, 2.5 y/o. 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  Has had increase in anxiety.    Christie Le has not been taking Wellbutirn (over a year).  Christie Le has tried [name] in the past but is unable to tolerate d/t SE.   Christie Le was recently seen in ER for palpitations.  Has now followed with Cardiology.  EKG and CXR WNL.  PVC's.  Has reduced intake of caffeine.    Christie Le follows with Dr. Benjie Karvonen at [facility] for [place], has mirena.   Hx of BCC of left breast wall removed at skin surgery in 2019.   Christie Le is due for first mammogram.  Persons self breast exams without any concern.  Christie Le takes singulair daily, breo/albuterol PRN for mild intermittent asthma. Certain smells and allergies trigger.   Christie Le has GERD, improved with lifestyle, off of PPI.    BMI is Body mass index is 27.54 kg/m., Christie Le has been working on diet and exercise. Christie Le has done well with optavia with a weight loss of 30 lb.  Has regained most of the weight and requesting to try further intervention for weight loss. Wt Readings from Last 3 Encounters:  03/27/22 170 lb 9.6 oz (77.4 kg)  02/10/22 155 lb (70.3 kg)  01/19/22 141 lb 15.6 oz (64.4 kg)   Her blood pressure has been controlled at home,  today their BP is BP: 110/82 Christie Le does workout, 3-4 times a week. Christie Le denies chest pain, shortness of breath, dizziness.   Christie Le is not on cholesterol medication and denies myalgias. Her cholesterol is at goal. The cholesterol last  visit was:   Lab Results  Component Value Date   CHOL 168 02/06/2021   HDL 37 (L) 02/06/2021   LDLCALC 115 (H) 02/06/2021   TRIG 65 02/06/2021   CHOLHDL 4.5 02/06/2021   Last A1C in the office was:  Lab Results  Component Value Date   HGBA1C 5.2 02/06/2021   Patient is on Vitamin D supplement, Christie Le is on 5000 IU- has been on 10,000 last few weeks.    Lab Results  Component Value Date   VD25OH 68 07/25/2020     Current Medications:     Current Outpatient Medications (Respiratory):    albuterol (VENTOLIN HFA) 108 (90 Base) MCG/ACT inhaler, INHALE 1 TO 2 PUFFS BY MOUTH EVERY 4 HOURS FOR WHEEZING OR SHORTNESS OF BREATH   BREO ELLIPTA 100-25 MCG/INH AEPB, inhale 1 PUFF into THE lungs ONCE A DAY. RINSE MOUTH WITH WATER AFTER EACH USE   fluticasone (FLONASE) 50 MCG/ACT nasal spray, USE 2 SPRAYS IN EACH NOSTRIL EVERY DAY   montelukast (SINGULAIR) 10 MG tablet, TAKE 1 TABLET BY MOUTH EVERY DAY FOR allergies  Current Outpatient Medications (Analgesics):    acetaminophen (TYLENOL) 500 MG tablet, Take 2 tablets (1,000 mg total) by mouth every 4 (four) hours as needed for mild pain.   aspirin EC 81 MG tablet, Take 81 mg by mouth daily. Swallow whole.   meloxicam (MOBIC) 15 MG tablet, Take  1/2 to 1 tablet  Daily with Food for Pain & Inflammation   Current Outpatient Medications (Other):    Cholecalciferol (VITAMIN D3) 125 MCG (5000 UT) CAPS, Take 5,000 Units by mouth at bedtime.   magnesium oxide (MAG-OX) 400 MG tablet, Take 400 mg by mouth at bedtime.   ondansetron (ZOFRAN-ODT) 4 MG disintegrating tablet, DISSOLVE ONE TABLET UNDER THE TONGUE 3 TIMES DAILY AS NEEDED FOR NAUSEA AND VOMITING   OVER THE COUNTER MEDICATION, Apply 1 application topically at bedtime. Magneium cream(MG-12). Apply to feet at bedtime for restless legs.   Tirzepatide-Weight Management (ZEPBOUND) 2.5 MG/0.5ML SOAJ, Inject 2.5 mg into the skin once a week.   tretinoin (RETIN-A) 0.05 % cream, apply topically TO THE  FACE AT BEDTIME   valACYclovir (VALTREX) 1000 MG tablet, TAKE 2 TABLETS BY MOUTH 2 TIMES DAILY (Patient taking differently: as needed.)   buPROPion (WELLBUTRIN XL) 150 MG 24 hr tablet, TAKE 2 TABLETS BY MOUTH EVERY MORNING (Patient not taking: Reported on 03/27/2022)  Health Maintenance:   Immunization History  Administered Date(s) Administered   Influenza Inj Mdck Quad Pf 02/17/2021   Influenza,inj,Quad PF,6+ Mos 01/21/2018, 02/09/2019, 01/14/2022   Influenza-Unspecified 11/21/2012, 01/21/2018, 02/09/2019   Moderna Covid-19 Vaccine Bivalent Booster 7yr & up 02/18/2021   PFIZER(Purple Top)SARS-COV-2 Vaccination 05/12/2019, 06/02/2019, 02/20/2020   Pfizer Covid-19 Vaccine Bivalent Booster 124yr& up 01/27/2022   Tdap 05/10/2012, 01/18/2018    LMP:  No cycle d/t Mirena  Tetanus: 2019 Pneumovax: N/A Flu vaccine: UTD for 2023 gets at pharmacy  Covid 19: 2/2 pfizer will get new booster  Pap: Dr. MoLisbeth RenshawMirena, neg PAP 06/29/2020 2020 placed  LMP:   Hx abnormal pap in 2018- colposcopy normal.   MGM: Due.  Order placed.  Colonoscopy: N/A EGD: N.A   [id number] Last Eye Exam: None,  has been to [facility] in the past 2023  Patient Care Team: Unk Pinto, MD as PCP - General (Internal Medicine)  Medical History:  Past Medical History:  Diagnosis Date   Anemia    hx of   Asthma    no problems since pregnancy   Breech presentation 04/26/2018   Family history of anesthesia complication    "mom is sensitive- severe n/v"   Fibrocystic breast    Gallstones 09/25/2013   Gestational hypertension 04/26/2018   Pneumonia    age 70   PONV (postoperative nausea and vomiting)    Postpartum care following cesarean delivery (1/8) 04/27/2018   Previous cesarean section 04/26/2018   S/P laparoscopic cholecystectomy June 15 09/25/2013   Single small stone; chronic changes;  Small bile ducts    Seasonal allergies    Vitamin D deficiency     Allergies No Known Allergies  SURGICAL HISTORY Christie Le  has a past surgical history that includes Cesarean section (07/29/2009); Wisdom tooth extraction (2002); Cholecystectomy (N/A, 09/25/2013); and Cesarean section (N/A, 04/26/2018). FAMILY HISTORY Her family history includes Basal cell carcinoma (age of onset: 57) in her mother; Breast cancer in her maternal grandmother; CAD in her paternal grandfather; COPD in her maternal grandmother; Cancer in her father; Dementia in her maternal grandfather; Diabetes in her father; Heart disease in her maternal grandmother; Hypertension in her maternal grandfather, mother, and another family member; Lung cancer in her maternal grandmother. SOCIAL HISTORY Christie Le  reports that Christie Le has never smoked. Christie Le has never used smokeless tobacco. Christie Le reports that Christie Le does not currently use alcohol. Christie Le reports that Christie Le does not use drugs.  Review of Systems: Review of Systems  Constitutional:  Negative for malaise/fatigue and weight loss.  HENT:  Negative for hearing loss and tinnitus.   Eyes:  Negative for blurred vision and double vision.  Respiratory:  Negative for cough, sputum production, shortness of breath and wheezing.   Cardiovascular:  Negative for chest pain, palpitations, orthopnea, claudication, leg swelling and PND.  Gastrointestinal:  Negative for abdominal pain, blood in stool, constipation, diarrhea, heartburn, melena, nausea and vomiting.  Genitourinary: Negative.   Musculoskeletal:  Negative for back pain, joint pain and myalgias.  Skin:  Negative for rash.  Neurological:  Negative for dizziness, tingling, sensory change, weakness and headaches.  Endo/Heme/Allergies:  Negative for polydipsia.  Psychiatric/Behavioral:  Positive for depression (seasonal). Negative for memory loss, substance abuse and suicidal ideas. The patient is nervous/anxious. The patient does not have insomnia.   All other systems reviewed and are negative.   Physical  Exam: Estimated body mass index is 27.54 kg/m as calculated from the following:   Height as of this encounter: '5\' 6"'$  (1.676 m).   Weight as of this encounter: 170 lb 9.6 oz (77.4 kg). BP 110/82   Pulse 66   Temp (!) 97.5 F (36.4 C)   Ht '5\' 6"'$  (1.676 m)   Wt 170 lb 9.6 oz (77.4 kg)   SpO2 98%   BMI 27.54 kg/m  General Appearance: Well nourished, in no apparent distress.  Eyes: PERRLA, EOMs, conjunctiva no swelling or erythema, normal fundi and vessels.  Sinuses: No Frontal/maxillary tenderness  ENT/Mouth: Ext aud canals clear, normal light reflex with TMs without erythema, bulging. Good dentition. No erythema, swelling, or exudate on post pharynx. Tonsils not enlarged. Hearing normal.  Neck: Supple, thyroid normal. No bruits  Respiratory: Respiratory effort normal, BS equal bilaterally without rales, rhonchi, wheezing or stridor.  Cardio: RRR without murmurs, rubs or gallops.  Brisk peripheral pulses without edema.  Chest: symmetric, with normal excursions and percussion.  Breasts: defer OB/GYN Abdomen: Soft, nontender, no guarding, rebound, hernias, masses, or organomegaly. .  Lymphatics: Non tender without lymphadenopathy.  Genitourinary: defer OB/GYN Musculoskeletal: Full ROM all peripheral extremities,5/5 strength, and normal gait.  Skin: Warm, dry without rashes, lesions, ecchymosis. Neuro: Cranial nerves intact, reflexes equal bilaterally. Normal muscle tone, no cerebellar symptoms. Sensation intact.  Psych: Awake and oriented X 3, normal affect, Insight and Judgment appropriate.   EKG: defer recent one with Cardiology  Shakeerah Gradel 12:21 PM [facility]

## 2022-03-27 NOTE — Patient Instructions (Signed)

## 2022-03-29 ENCOUNTER — Other Ambulatory Visit: Payer: Self-pay | Admitting: Nurse Practitioner

## 2022-03-29 LAB — CBC WITH DIFFERENTIAL/PLATELET
Absolute Monocytes: 432 cells/uL (ref 200–950)
Basophils Absolute: 40 cells/uL (ref 0–200)
Basophils Relative: 0.5 %
Eosinophils Absolute: 48 cells/uL (ref 15–500)
Eosinophils Relative: 0.6 %
HCT: 39.7 % (ref 35.0–45.0)
Hemoglobin: 13.4 g/dL (ref 11.7–15.5)
Lymphs Abs: 2104 cells/uL (ref 850–3900)
MCH: 30 pg (ref 27.0–33.0)
MCHC: 33.8 g/dL (ref 32.0–36.0)
MCV: 88.8 fL (ref 80.0–100.0)
MPV: 11.5 fL (ref 7.5–12.5)
Monocytes Relative: 5.4 %
Neutro Abs: 5376 cells/uL (ref 1500–7800)
Neutrophils Relative %: 67.2 %
Platelets: 324 10*3/uL (ref 140–400)
RBC: 4.47 10*6/uL (ref 3.80–5.10)
RDW: 12 % (ref 11.0–15.0)
Total Lymphocyte: 26.3 %
WBC: 8 10*3/uL (ref 3.8–10.8)

## 2022-03-29 LAB — COMPLETE METABOLIC PANEL WITH GFR
AG Ratio: 1.6 (calc) (ref 1.0–2.5)
ALT: 14 U/L (ref 6–29)
AST: 16 U/L (ref 10–30)
Albumin: 4.8 g/dL (ref 3.6–5.1)
Alkaline phosphatase (APISO): 67 U/L (ref 31–125)
BUN: 16 mg/dL (ref 7–25)
CO2: 25 mmol/L (ref 20–32)
Calcium: 9.9 mg/dL (ref 8.6–10.2)
Chloride: 104 mmol/L (ref 98–110)
Creat: 0.66 mg/dL (ref 0.50–0.99)
Globulin: 3 g/dL (calc) (ref 1.9–3.7)
Glucose, Bld: 88 mg/dL (ref 65–99)
Potassium: 4.3 mmol/L (ref 3.5–5.3)
Sodium: 139 mmol/L (ref 135–146)
Total Bilirubin: 0.4 mg/dL (ref 0.2–1.2)
Total Protein: 7.8 g/dL (ref 6.1–8.1)
eGFR: 114 mL/min/{1.73_m2} (ref >=60)

## 2022-03-29 LAB — URINALYSIS, ROUTINE W REFLEX MICROSCOPIC
Bilirubin Urine: NEGATIVE
Glucose, UA: NEGATIVE
Hgb urine dipstick: NEGATIVE
Ketones, ur: NEGATIVE
Leukocytes,Ua: NEGATIVE
Nitrite: NEGATIVE
Protein, ur: NEGATIVE
Specific Gravity, Urine: 1.02 (ref 1.001–1.035)
pH: 5.5 (ref 5.0–8.0)

## 2022-03-29 LAB — EPSTEIN-BARR VIRUS VCA ANTIBODY PANEL
EBV NA IgG: >600 U/mL — ABNORMAL HIGH
EBV VCA IgG: 217 U/mL — ABNORMAL HIGH
EBV VCA IgM: <36 U/mL

## 2022-03-29 LAB — MICROALBUMIN / CREATININE URINE RATIO
Creatinine, Urine: 89 mg/dL (ref 20–275)
Microalb Creat Ratio: 2 mcg/mg creat (ref <30)
Microalb, Ur: 0.2 mg/dL

## 2022-03-29 LAB — LIPID PANEL
Cholesterol: 211 mg/dL — ABNORMAL HIGH (ref <200)
HDL: 52 mg/dL (ref >=50)
LDL Cholesterol (Calc): 144 mg/dL (calc) — ABNORMAL HIGH
Non-HDL Cholesterol (Calc): 159 mg/dL (calc) — ABNORMAL HIGH (ref <130)
Total CHOL/HDL Ratio: 4.1 (calc) (ref <5.0)
Triglycerides: 55 mg/dL (ref <150)

## 2022-03-29 LAB — TSH: TSH: 1.19 mIU/L

## 2022-03-29 LAB — HEMOGLOBIN A1C
Hgb A1c MFr Bld: 5.6 % of total Hgb (ref <5.7)
Mean Plasma Glucose: 114 mg/dL
eAG (mmol/L): 6.3 mmol/L

## 2022-03-29 LAB — VITAMIN D 25 HYDROXY (VIT D DEFICIENCY, FRACTURES): Vit D, 25-Hydroxy: 37 ng/mL (ref 30–100)

## 2022-03-29 LAB — INSULIN, RANDOM: Insulin: 9.3 u[IU]/mL

## 2022-03-29 MED ORDER — WEGOVY 0.25 MG/0.5ML ~~LOC~~ SOAJ: 0.2500 mg | SUBCUTANEOUS | 1 refills | Status: DC

## 2022-04-01 ENCOUNTER — Telehealth: Payer: Self-pay

## 2022-04-01 NOTE — Telephone Encounter (Signed)
I called the insurance company, plan doesn't cover obesity medications. Zepbound would be $1, 208.25 out of pocket. Ollen Barges would be $1,537.88 out of pocket.

## 2022-04-15 ENCOUNTER — Ambulatory Visit
Admission: RE | Admit: 2022-04-15 | Discharge: 2022-04-15 | Disposition: A | Discharge status: Home / Self Care | Payer: Managed Care, Other (non HMO) | Source: Ambulatory Visit | Attending: Nurse Practitioner | Admitting: Nurse Practitioner

## 2022-04-15 DIAGNOSIS — Z79899 Other long term (current) drug therapy: Secondary | ICD-10-CM

## 2022-04-15 DIAGNOSIS — Z1231 Encounter for screening mammogram for malignant neoplasm of breast: Secondary | ICD-10-CM

## 2022-05-13 ENCOUNTER — Other Ambulatory Visit: Payer: Self-pay | Admitting: Nurse Practitioner

## 2022-05-28 ENCOUNTER — Other Ambulatory Visit: Payer: Self-pay | Admitting: Nurse Practitioner

## 2022-05-28 DIAGNOSIS — E663 Overweight: Secondary | ICD-10-CM

## 2022-06-01 ENCOUNTER — Telehealth: Payer: Self-pay

## 2022-06-01 NOTE — Telephone Encounter (Addendum)
Prior Authorization for Zepbound completed and submitted to CoverMyMeds. Unable to process, need to call [employer] @ [phone]

## 2022-06-02 ENCOUNTER — Encounter: Payer: Self-pay | Admitting: Nurse Practitioner

## 2022-06-02 ENCOUNTER — Ambulatory Visit: Payer: Managed Care, Other (non HMO) | Admitting: Nurse Practitioner

## 2022-06-02 ENCOUNTER — Other Ambulatory Visit: Payer: Self-pay | Admitting: Nurse Practitioner

## 2022-06-02 VITALS — BP 122/68 | HR 78 | Temp 97.7°F | Ht 66.0 in | Wt 158.2 lb

## 2022-06-02 DIAGNOSIS — E663 Overweight: Secondary | ICD-10-CM | POA: Diagnosis not present

## 2022-06-02 DIAGNOSIS — N3090 Cystitis, unspecified without hematuria: Secondary | ICD-10-CM

## 2022-06-02 MED ORDER — NITROFURANTOIN MONOHYD MACRO 100 MG PO CAPS: 100.0000 mg | ORAL_CAPSULE | Freq: Two times a day (BID) | ORAL | 0 refills | Status: AC

## 2022-06-02 MED ORDER — PHENAZOPYRIDINE HCL 200 MG PO TABS: 200.0000 mg | ORAL_TABLET | Freq: Three times a day (TID) | ORAL | 0 refills | Status: DC | PRN

## 2022-06-02 NOTE — Addendum Note (Signed)
Addended by: Lorenda Peck E on: 06/02/2022 03:57 PM   Modules accepted: Orders

## 2022-06-02 NOTE — Patient Instructions (Signed)
Urinary Tract Infection, Adult A urinary tract infection (UTI) is an infection of any part of the urinary tract. The urinary tract includes: The kidneys. The ureters. The bladder. The urethra. These organs make, store, and get rid of pee (urine) in the body. What are the causes? This infection is caused by germs (bacteria) in your genital area. These germs grow and cause swelling (inflammation) of your urinary tract. What increases the risk? The following factors may make you more likely to develop this condition: Using a small, thin tube (catheter) to drain pee. Not being able to control when you pee or poop (incontinence). Being female. If you are female, these things can increase the risk: Using these methods to prevent pregnancy: A medicine that kills sperm (spermicide). A device that blocks sperm (diaphragm). Having low levels of a female hormone (estrogen). Being pregnant. You are more likely to develop this condition if: You have genes that add to your risk. You are sexually active. You take antibiotic medicines. You have trouble peeing because of: A prostate that is bigger than normal, if you are female. A blockage in the part of your body that drains pee from the bladder. A kidney stone. A nerve condition that affects your bladder. Not getting enough to drink. Not peeing often enough. You have other conditions, such as: Diabetes. A weak disease-fighting system (immune system). Sickle cell disease. Gout. Injury of the spine. What are the signs or symptoms? Symptoms of this condition include: Needing to pee right away. Peeing small amounts often. Pain or burning when peeing. Blood in the pee. Pee that smells bad or not like normal. Trouble peeing. Pee that is cloudy. Fluid coming from the vagina, if you are female. Pain in the belly or lower back. Other symptoms include: Vomiting. Not feeling hungry. Feeling mixed up (confused). This may be the first symptom in  older adults. Being tired and grouchy (irritable). A fever. Watery poop (diarrhea). How is this treated? Taking antibiotic medicine. Taking other medicines. Drinking enough water. In some cases, you may need to see a specialist. Follow these instructions at home:  Medicines Take over-the-counter and prescription medicines only as told by your doctor. If you were prescribed an antibiotic medicine, take it as told by your doctor. Do not stop taking it even if you start to feel better. General instructions Make sure you: Pee until your bladder is empty. Do not hold pee for a long time. Empty your bladder after sex. Wipe from front to back after peeing or pooping if you are a female. Use each tissue one time when you wipe. Drink enough fluid to keep your pee pale yellow. Keep all follow-up visits. Contact a doctor if: You do not get better after 1-2 days. Your symptoms go away and then come back. Get help right away if: You have very bad back pain. You have very bad pain in your lower belly. You have a fever. You have chills. You feeling like you will vomit or you vomit. Summary A urinary tract infection (UTI) is an infection of any part of the urinary tract. This condition is caused by germs in your genital area. There are many risk factors for a UTI. Treatment includes antibiotic medicines. Drink enough fluid to keep your pee pale yellow. This information is not intended to replace advice given to you by your health care provider. Make sure you discuss any questions you have with your health care provider. Document Revised: 11/17/2019 Document Reviewed: 11/17/2019 Elsevier Patient Education    2023 [employer]  

## 2022-06-02 NOTE — Progress Notes (Addendum)
Assessment and Plan:  Christie Le was seen today for urinary tract infection.  Diagnoses and all orders for this visit:  Overweight (BMI 25.0-29.9) Continue diet and exercise Do 1 more month of 2.5 before increasing Zepbound to 5 mg due to persistent nausea  Cystitis Push fluids Start Macrobid and will notify once culture results return if need to change antibiotic Urine routine with reflex microscopic Urine culture -     nitrofurantoin, macrocrystal-monohydrate, (MACROBID) 100 MG capsule; Take 1 capsule (100 mg total) by mouth 2 (two) times daily for 7 days. -     phenazopyridine (PYRIDIUM) 200 MG tablet; Take 1 tablet (200 mg total) by mouth 3 (three) times daily as needed for pain.       Further disposition pending results of labs. Discussed med's effects and SE's.   Over 30 minutes of exam, counseling, chart review, and critical decision making was performed.   Future Appointments  Date Time Provider [place]  03/30/2023 10:00 AM Cranford, Kenney Houseman, NP [facility] None    ------------------------------------------------------------------------------------------------------------------   HPI BP 122/68   Pulse 78   Temp 97.7 F (36.5 C)   Ht 5' 6"$  (1.676 m)   Wt 158 lb 3.2 oz (71.8 kg)   SpO2 98%   BMI 25.53 kg/m   41 y.o.female presents for pain/burning on urination, urgency and odor to urine x 1 week. She is having some low back pain.  Denies hematuria.  She does not have a history of UTI's.     She was started on Zepbound for Overweight BMI of 27.54 on 03/27/22.  She has been having a lot of nausea with the use of Zepbound at the lowest dose.  BMI is Body mass index is 25.53 kg/m., she has been working on diet and exercise.  Wt Readings from Last 3 Encounters:  06/02/22 158 lb 3.2 oz (71.8 kg)  03/27/22 170 lb 9.6 oz (77.4 kg)  02/10/22 155 lb (70.3 kg)    Past Medical History:  Diagnosis Date   Anemia    hx of   Asthma    no problems since pregnancy    Breech presentation 04/26/2018   Family history of anesthesia complication    "mom is sensitive- severe n/v"   Fibrocystic breast    Gallstones 09/25/2013   Gestational hypertension 04/26/2018   Pneumonia    age 75   PONV (postoperative nausea and vomiting)    Postpartum care following cesarean delivery (1/8) 04/27/2018   Previous cesarean section 04/26/2018   S/P laparoscopic cholecystectomy June 15 09/25/2013   Single small stone; chronic changes;  Small bile ducts    Seasonal allergies    Vitamin D deficiency      No Known Allergies  Current Outpatient Medications on File Prior to Visit  Medication Sig   acetaminophen (TYLENOL) 500 MG tablet Take 2 tablets (1,000 mg total) by mouth every 4 (four) hours as needed for mild pain.   aspirin EC 81 MG tablet Take 81 mg by mouth daily. Swallow whole.   BREO ELLIPTA 100-25 MCG/INH AEPB inhale 1 PUFF into THE lungs ONCE A DAY. RINSE MOUTH WITH WATER AFTER EACH USE   buPROPion (WELLBUTRIN XL) 150 MG 24 hr tablet TAKE 2 TABLETS BY MOUTH EVERY MORNING   Cholecalciferol (VITAMIN D3) 125 MCG (5000 UT) CAPS Take 5,000 Units by mouth at bedtime.   fluticasone (FLONASE) 50 MCG/ACT nasal spray USE 2 SPRAYS IN EACH NOSTRIL EVERY DAY   magnesium oxide (MAG-OX) 400 MG tablet Take 400 mg  by mouth at bedtime.   meloxicam (MOBIC) 15 MG tablet Take  1/2 to 1 tablet  Daily with Food for Pain & Inflammation   montelukast (SINGULAIR) 10 MG tablet TAKE 1 TABLET BY MOUTH EVERY DAY FOR allergies   ondansetron (ZOFRAN-ODT) 4 MG disintegrating tablet DISSOLVE ONE TABLET UNDER THE TONGUE 3 TIMES DAILY AS NEEDED FOR NAUSEA AND VOMITING   OVER THE COUNTER MEDICATION Apply 1 application topically at bedtime. Magneium cream(MG-12). Apply to feet at bedtime for restless legs.   tretinoin (RETIN-A) 0.05 % cream apply topically TO THE FACE AT BEDTIME   valACYclovir (VALTREX) 1000 MG tablet TAKE 2 TABLETS BY MOUTH 2 TIMES DAILY   ZEPBOUND 5 MG/0.5ML Pen INJECT 22m SUBCUTANEOUSLY  ONCE WEEKLY.   No current facility-administered medications on file prior to visit.    ROS: all negative except above.   Physical Exam:  BP 122/68   Pulse 78   Temp 97.7 F (36.5 C)   Ht 5' 6"$  (1.676 m)   Wt 158 lb 3.2 oz (71.8 kg)   SpO2 98%   BMI 25.53 kg/m   General Appearance: Well nourished, in no apparent distress. Eyes: PERRLA, EOMs, conjunctiva no swelling or erythema Sinuses: No Frontal/maxillary tenderness ENT/Mouth: Ext aud canals clear, TMs without erythema, bulging.  Hearing normal.  Neck: Supple, thyroid normal.  Respiratory: Respiratory effort normal, BS equal bilaterally without rales, rhonchi, wheezing or stridor.  Cardio: RRR with no MRGs. Brisk peripheral pulses without edema.  Abdomen: Soft, + BS.  Non tender, no guarding, rebound, hernias, masses. Lymphatics: Non tender without lymphadenopathy.  Musculoskeletal: Full ROM, 5/5 strength, normal gait. Negative CVA tenderness Skin: Warm, dry without rashes, lesions, ecchymosis.  Psych: Awake and oriented X 3, normal affect, Insight and Judgment appropriate.     DAlycia Rossetti NP 3:10 PM [facility]

## 2022-06-03 LAB — URINALYSIS, ROUTINE W REFLEX MICROSCOPIC
Bilirubin Urine: NEGATIVE
Glucose, UA: NEGATIVE
Hgb urine dipstick: NEGATIVE
Ketones, ur: NEGATIVE
Leukocytes,Ua: NEGATIVE
Nitrite: NEGATIVE
Protein, ur: NEGATIVE
Specific Gravity, Urine: 1.011 (ref 1.001–1.035)
pH: 5.5 (ref 5.0–8.0)

## 2022-06-03 LAB — URINE CULTURE
MICRO NUMBER:: 14558589
Result:: NO GROWTH
SPECIMEN QUALITY:: ADEQUATE

## 2022-06-03 NOTE — Telephone Encounter (Signed)
Spoke with [place], PA was processed and denied.   Patient aware

## 2022-06-08 ENCOUNTER — Other Ambulatory Visit: Payer: Self-pay | Admitting: Nurse Practitioner

## 2022-06-08 DIAGNOSIS — E663 Overweight: Secondary | ICD-10-CM

## 2022-06-12 ENCOUNTER — Encounter: Payer: Self-pay | Admitting: Internal Medicine

## 2022-06-23 ENCOUNTER — Other Ambulatory Visit: Payer: Self-pay | Admitting: Internal Medicine

## 2022-07-19 ENCOUNTER — Other Ambulatory Visit: Payer: Self-pay | Admitting: Nurse Practitioner

## 2022-08-12 ENCOUNTER — Other Ambulatory Visit: Payer: Self-pay | Admitting: Nurse Practitioner

## 2022-08-17 ENCOUNTER — Other Ambulatory Visit: Payer: Self-pay | Admitting: Internal Medicine

## 2022-08-17 DIAGNOSIS — O219 Vomiting of pregnancy, unspecified: Secondary | ICD-10-CM

## 2022-08-18 ENCOUNTER — Other Ambulatory Visit: Payer: Self-pay | Admitting: Nurse Practitioner

## 2022-09-21 ENCOUNTER — Emergency Department (HOSPITAL_BASED_OUTPATIENT_CLINIC_OR_DEPARTMENT_OTHER)
Admission: EM | Admit: 2022-09-21 | Discharge: 2022-09-21 | Disposition: A | Discharge status: Home / Self Care | Payer: Managed Care, Other (non HMO) | Attending: Emergency Medicine | Admitting: Emergency Medicine

## 2022-09-21 ENCOUNTER — Encounter (HOSPITAL_BASED_OUTPATIENT_CLINIC_OR_DEPARTMENT_OTHER): Payer: Self-pay

## 2022-09-21 ENCOUNTER — Other Ambulatory Visit: Payer: Self-pay

## 2022-09-21 DIAGNOSIS — Z7982 Long term (current) use of aspirin: Secondary | ICD-10-CM | POA: Diagnosis not present

## 2022-09-21 DIAGNOSIS — R42 Dizziness and giddiness: Secondary | ICD-10-CM | POA: Diagnosis present

## 2022-09-21 LAB — CBC
HCT: 41 % (ref 36.0–46.0)
Hemoglobin: 13.7 g/dL (ref 12.0–15.0)
MCH: 29.5 pg (ref 26.0–34.0)
MCHC: 33.4 g/dL (ref 30.0–36.0)
MCV: 88.2 fL (ref 80.0–100.0)
Platelets: 320 10*3/uL (ref 150–400)
RBC: 4.65 MIL/uL (ref 3.87–5.11)
RDW: 12.3 % (ref 11.5–15.5)
WBC: 9 10*3/uL (ref 4.0–10.5)
nRBC: 0 % (ref 0.0–0.2)

## 2022-09-21 LAB — BASIC METABOLIC PANEL
Anion gap: 7 (ref 5–15)
BUN: 12 mg/dL (ref 6–20)
CO2: 24 mmol/L (ref 22–32)
Calcium: 8.8 mg/dL — ABNORMAL LOW (ref 8.9–10.3)
Chloride: 105 mmol/L (ref 98–111)
Creatinine, Ser: 0.87 mg/dL (ref 0.44–1.00)
GFR, Estimated: >60 mL/min (ref >60)
Glucose, Bld: 91 mg/dL (ref 70–99)
Potassium: 4 mmol/L (ref 3.5–5.1)
Sodium: 136 mmol/L (ref 135–145)

## 2022-09-21 MED ORDER — MECLIZINE HCL 25 MG PO TABS
25.0000 mg | ORAL_TABLET | Freq: Once | ORAL | Status: AC
  Administered 2022-09-21: 25 mg via ORAL
  Filled 2022-09-21: qty 1

## 2022-09-21 MED ORDER — METOCLOPRAMIDE HCL 10 MG PO TABS
10.0000 mg | ORAL_TABLET | Freq: Once | ORAL | Status: AC
  Administered 2022-09-21: 10 mg via ORAL
  Filled 2022-09-21: qty 1

## 2022-09-21 NOTE — ED Provider Notes (Signed)
[place] [facility]PART[facility] AT [facility] Provider Note   CSN: [id number] Arrival date & time: 09/21/22  1640     History  Chief Complaint  Patient presents with   Dizziness    Christie Le is a 42 y.o. female.   Dizziness Patient is a 42 year old female with no pertinent past medical history presented emergency room today with complaints of vertigo which she describes as a spinning sensation is been intermittently occurring since Friday.  She states that it seems to occur when she sits up she has not had any syncopal episodes and she does not feel lightheaded as if she is Passed out.  She denies any chest pain difficulty breathing no cough or hemoptysis.  No unilateral or bilateral leg swelling.  She has no history of heart failure.  She denies any urinary frequency urgency dysuria hematuria no fevers or chills.  She denies any blurry vision or double vision or visual field cuts.  She states she has felt nauseated but has not had any episodes of emesis.  She occasionally has had frontal headaches during his period of time more often during the evenings she does not have positional headaches or exertional headaches.     Home Medications Prior to Admission medications   Medication Sig Start Date End Date Taking? Authorizing Provider  acetaminophen (TYLENOL) 500 MG tablet Take 2 tablets (1,000 mg total) by mouth every 4 (four) hours as needed for mild pain. 04/30/18   Neta Mends, CNM  albuterol (VENTOLIN HFA) 108 (90 Base) MCG/ACT inhaler INHALE 1 TO 2 PUFFS BY MOUTH EVERY 4 HOURS AS NEEDED FOR WHEEZING OR SHORTNESS OF BREATH 06/02/22   Raynelle Dick, NP  aspirin EC 81 MG tablet Take 81 mg by mouth daily. Swallow whole.    [provider]  BREO ELLIPTA 100-25 MCG/INH AEPB inhale 1 PUFF into THE lungs ONCE A DAY. RINSE MOUTH WITH WATER AFTER [facility] USE 07/04/18   Doree Albee, PA-C  buPROPion (WELLBUTRIN XL) 150 MG 24 hr tablet TAKE 2 TABLETS BY  MOUTH EVERY MORNING 07/20/22   [name], NP  Cholecalciferol (VITAMIN D3) 125 MCG (5000 UT) CAPS Take 5,000 Units by mouth at bedtime.    [provider]  fluticasone (FLONASE) 50 MCG/ACT nasal spray USE 2 SPRAYS IN [facility] NOSTRIL EVERY DAY 08/18/22   Raynelle Dick, NP  magnesium oxide (MAG-OX) 400 MG tablet Take 400 mg by mouth at bedtime.    [provider]  meloxicam (MOBIC) 15 MG tablet Take  1/2 to 1 tablet  Daily with Food for Pain & Inflammation 08/19/20   [name], MD  montelukast (SINGULAIR) 10 MG tablet TAKE 1 TABLET BY MOUTH EVERY DAY FOR allergies 08/12/22   Raynelle Dick, NP  ondansetron (ZOFRAN-ODT) 4 MG disintegrating tablet Dissolve 1 tablet in the Mouth 3 x /day (every 6 to 8 Hours) as Needed for Nausea or Vomiting                                             /                                         DISSOLVE  BY                                             MOUTH 08/17/22   [name], MD  OVER THE COUNTER MEDICATION Apply 1 application topically at bedtime. Magneium cream(MG-12). Apply to feet at bedtime for restless legs.    [provider]  phenazopyridine (PYRIDIUM) 200 MG tablet Take 1 tablet (200 mg total) by mouth 3 (three) times daily as needed for pain. 06/02/22 06/02/23  Raynelle Dick, NP  tretinoin (RETIN-A) 0.05 % cream APPLY topically TO THE FACE AT BEDTIME 06/23/22   [name], MD  valACYclovir (VALTREX) 1000 MG tablet TAKE 2 TABLETS BY MOUTH 2 TIMES DAILY 05/14/22   [name], NP  ZEPBOUND 2.5 MG/0.5ML Pen INJECT 2.5MG  SUBCUTANEOUSLY ONCE WEEKLY. 06/08/22   Raynelle Dick, NP  ZEPBOUND 5 MG/0.5ML Pen INJECT 5mg  SUBCUTANEOUSLY ONCE WEEKLY. 06/01/22   [name], NP      Allergies    Patient has no known allergies.    Review of Systems   Review of Systems  Neurological:  Positive for dizziness.    Physical Exam Updated Vital Signs BP 115/83 (BP Location: Left  Arm)   Pulse 77   Temp 98.5 F (36.9 C) (Oral)   Resp 18   Ht 5\' 6"  (1.676 m)   Wt 63.5 kg   SpO2 100%   BMI 22.60 kg/m  Physical Exam Vitals and nursing note reviewed.  Constitutional:      General: She is not in acute distress. HENT:     Head: Normocephalic and atraumatic.     Nose: Nose normal.  Eyes:     General: No scleral icterus. Cardiovascular:     Rate and Rhythm: Normal rate and regular rhythm.     Pulses: Normal pulses.     Heart sounds: Normal heart sounds.  Pulmonary:     Effort: Pulmonary effort is normal. No respiratory distress.     Breath sounds: No wheezing.  Abdominal:     Palpations: Abdomen is soft.     Tenderness: There is no abdominal tenderness.  Musculoskeletal:     Cervical back: Normal range of motion.     Right lower leg: No edema.     Left lower leg: No edema.  Skin:    General: Skin is warm and dry.     Capillary Refill: Capillary refill takes less than 2 seconds.  Neurological:     Mental Status: She is alert. Mental status is at baseline.     Comments: Alert and oriented to self, place, time and event.   Speech is fluent, clear without dysarthria or dysphasia.   Strength 5/5 in upper/lower extremities   Sensation intact in upper/lower extremities   Normal gait.  Normal finger-to-nose and feet tapping.  Normal heel-to-shin CN I not tested  CN II grossly intact visual fields bilaterally. Did not visualize posterior eye.  CN III, IV, VI PERRLA and EOMs intact bilaterally  CN V Intact sensation to sharp and light touch to the face  CN VII facial movements symmetric  CN VIII not tested  CN IX, X no uvula deviation, symmetric rise of soft palate  CN XI 5/5 SCM and trapezius strength bilaterally  CN XII Midline tongue protrusion, symmetric L/R movements     Psychiatric:        Mood and Affect: Mood  normal.        Behavior: Behavior normal.     ED Results / Procedures / Treatments   Labs (all labs ordered are listed, but only  abnormal results are displayed) Labs Reviewed  BASIC METABOLIC PANEL - Abnormal; Notable for the following components:      Result Value   Calcium 8.8 (*)    All other components within normal limits  CBC    EKG None  Radiology No results found.  Procedures Procedures    Medications Ordered in ED Medications  meclizine (ANTIVERT) tablet 25 mg (25 mg Oral Given 09/21/22 1755)  metoCLOPramide (REGLAN) tablet 10 mg (10 mg Oral Given 09/21/22 1755)    ED Course/ Medical Decision Making/ A&P                             Medical Decision Making Amount and/or Complexity of Data Reviewed Labs: ordered.  Risk Prescription drug management.   This patient presents to the ED for concern of vertigo, this involves a number of treatment options, and is a complaint that carries with it a moderate to high risk of complications and morbidity. A differential diagnosis was considered for the patient's symptoms which is discussed below:   The differential diagnosis for vertigo includes but is not limited to: has not experienced auditory symptoms    Auditory symptoms absent  Seconds: positioning vertigo (cupulolithiasis), vertebrobasilar insufficiency, cervical vertigo (head-extension vertigo), diplopia Hours: recurrent vestibulopathy (Mnire's disease without auditory symptoms), vestibular migraine Days: vestibular neuronitis, head trauma Months: vertebrobasilar insufficiency, arteriovenous malformation, brainstem or cerebellar tumor, cerebellar degeneration, multiple sclerosis, vertebrobasilar migraine Drugs: Anticonvulsants (eg, phenytoin), antibiotics (eg, aminoglycosides, doxycycline, metronidazole), hypnotics (eg, diazepam), analgesics (eg, aspirin), alcohol    Co morbidities: Discussed in HPI   Brief History:  Patient is a 42 year old female with no pertinent past medical history presented emergency room today with complaints of vertigo which she describes as a spinning  sensation is been intermittently occurring since Friday.  She states that it seems to occur when she sits up she has not had any syncopal episodes and she does not feel lightheaded as if she is Passed out.  She denies any chest pain difficulty breathing no cough or hemoptysis.  No unilateral or bilateral leg swelling.  She has no history of heart failure.  She denies any urinary frequency urgency dysuria hematuria no fevers or chills.  She denies any blurry vision or double vision or visual field cuts.  She states she has felt nauseated but has not had any episodes of emesis.  She occasionally has had frontal headaches during his period of time more often during the evenings she does not have positional headaches or exertional headaches.    EMR reviewed including pt PMHx, past surgical history and past visits to ER.   See HPI for more details   Lab Tests:   I personally reviewed all laboratory work and imaging. Metabolic panel without any acute abnormality specifically kidney function within normal limits and no significant electrolyte abnormalities. CBC without leukocytosis or significant anemia.   Imaging Studies:  No imaging studies ordered for this patient    Cardiac Monitoring:  The patient was maintained on a cardiac monitor.  I personally viewed and interpreted the cardiac monitored which showed an underlying rhythm of: NSR, normal rhythm and rate EKG non-ischemic Normal EKG  Medicines ordered:  I ordered medication including meclizine, Reglan for headache and dizziness/vertigo Reevaluation of the patient  after these medicines showed that the patient improved I have reviewed the patients home medicines and have made adjustments as needed   Critical Interventions:     Consults/Attending Physician      Reevaluation:  After the interventions noted above I re-evaluated patient and found that they have :improved   Social Determinants of  Health:      Problem List / ED Course:  Patient with intermittent vertiginous symptoms.  Neurologically intact on exam no stroke risk factors and her symptoms or not consistent with this.  She has very subtle leftward beating nystagmus.  Epley maneuver was conducted and patient seems to be feeling quite a bit better she was able to sit up without vertigo which had previously been bothering her.  Given handout for the Epley maneuver instructions. Extremely strict return precautions were provided for her for any numbness weakness slurred speech confusion   Dispostion:  After consideration of the diagnostic results and the patients response to treatment, I feel that the patent would benefit from outpatient follow-up with PCP, I gave her ENT information as well.  Final Clinical Impression(s) / ED Diagnoses Final diagnoses:  Vertigo    Rx / DC Orders ED Discharge Orders     None         Gailen Shelter, [place] 09/21/22 2137    Glyn Ade, MD 09/24/22 [phone]

## 2022-09-21 NOTE — Discharge Instructions (Addendum)
Vertigo is a symptom rather than a diagnosis however your symptoms are consistent with a condition called benign positional paroxysmal vertigo.  I have given you a printout of the Epley maneuver which you can do multiple times at home you may also try doing this both directions as detailed in the print out.  You may follow-up with a ear nose and throat doctor however I do think a reasonable neck step would be seeing your primary care doctor.  You can continue to use meclizine at home.  If you develop any drooling, this asymmetry to your face, numbness or weakness in either your arms or your legs please return to emergency room immediately.

## 2022-09-21 NOTE — ED Notes (Signed)
Patient reports improvement and states she is not dizzy. Patient is ambulatory on discharge.

## 2022-09-21 NOTE — ED Notes (Signed)
Urine specimen in lab 

## 2022-09-21 NOTE — ED Triage Notes (Signed)
Pt states has been experiencing "vertigo " since Friday +nausea, fatigue and occasional HA

## 2022-10-23 ENCOUNTER — Other Ambulatory Visit (HOSPITAL_COMMUNITY): Payer: Self-pay

## 2022-11-18 ENCOUNTER — Other Ambulatory Visit: Payer: Self-pay | Admitting: Nurse Practitioner

## 2022-11-18 DIAGNOSIS — E663 Overweight: Secondary | ICD-10-CM

## 2022-12-10 ENCOUNTER — Other Ambulatory Visit: Payer: Self-pay | Admitting: Nurse Practitioner

## 2022-12-25 ENCOUNTER — Other Ambulatory Visit: Payer: Self-pay | Admitting: Nurse Practitioner

## 2022-12-25 DIAGNOSIS — E663 Overweight: Secondary | ICD-10-CM

## 2023-01-19 ENCOUNTER — Other Ambulatory Visit: Payer: Self-pay | Admitting: Nurse Practitioner

## 2023-02-04 ENCOUNTER — Other Ambulatory Visit: Payer: Self-pay | Admitting: Nurse Practitioner

## 2023-02-12 ENCOUNTER — Other Ambulatory Visit (HOSPITAL_COMMUNITY): Payer: Self-pay

## 2023-02-25 ENCOUNTER — Ambulatory Visit: Payer: Managed Care, Other (non HMO) | Admitting: Nurse Practitioner

## 2023-03-02 ENCOUNTER — Other Ambulatory Visit: Payer: Self-pay | Admitting: Internal Medicine

## 2023-03-02 DIAGNOSIS — O219 Vomiting of pregnancy, unspecified: Secondary | ICD-10-CM

## 2023-03-10 ENCOUNTER — Other Ambulatory Visit: Payer: Self-pay

## 2023-03-10 ENCOUNTER — Encounter: Payer: Self-pay | Admitting: Nurse Practitioner

## 2023-03-10 ENCOUNTER — Ambulatory Visit (INDEPENDENT_AMBULATORY_CARE_PROVIDER_SITE_OTHER): Payer: Managed Care, Other (non HMO) | Admitting: Nurse Practitioner

## 2023-03-10 VITALS — BP 100/64 | HR 88 | Temp 97.7°F | Ht 66.0 in | Wt 132.8 lb

## 2023-03-10 DIAGNOSIS — J069 Acute upper respiratory infection, unspecified: Secondary | ICD-10-CM

## 2023-03-10 DIAGNOSIS — M25511 Pain in right shoulder: Secondary | ICD-10-CM

## 2023-03-10 DIAGNOSIS — Z1152 Encounter for screening for COVID-19: Secondary | ICD-10-CM

## 2023-03-10 DIAGNOSIS — R6889 Other general symptoms and signs: Secondary | ICD-10-CM

## 2023-03-10 DIAGNOSIS — J4520 Mild intermittent asthma, uncomplicated: Secondary | ICD-10-CM | POA: Diagnosis not present

## 2023-03-10 LAB — POC COVID19 BINAXNOW: SARS Coronavirus 2 Ag: NEGATIVE

## 2023-03-10 LAB — POCT INFLUENZA A/B
Influenza A, POC: NEGATIVE
Influenza B, POC: NEGATIVE

## 2023-03-10 MED ORDER — AZITHROMYCIN 250 MG PO TABS
ORAL_TABLET | ORAL | 1 refills | Status: DC
Start: 2023-03-10 — End: 2023-03-30

## 2023-03-10 MED ORDER — PREDNISONE 20 MG PO TABS
ORAL_TABLET | ORAL | 0 refills | Status: AC
Start: 2023-03-10 — End: 2023-03-21

## 2023-03-10 MED ORDER — PROMETHAZINE-DM 6.25-15 MG/5ML PO SYRP
5.0000 mL | ORAL_SOLUTION | Freq: Four times a day (QID) | ORAL | 1 refills | Status: AC | PRN
Start: 2023-03-10 — End: ?

## 2023-03-10 NOTE — Patient Instructions (Signed)

## 2023-03-10 NOTE — Progress Notes (Signed)
Assessment and Plan:  Christie "Brenetta pronounced Sh-anna" was seen today for acute visit.  Diagnoses and all orders for this visit:  Mild intermittent asthma without complication Continue meds, start Breo  Encounter for screening for COVID-19 -     POC COVID-19- negative  Flu-like symptoms -     POCT Influenza A/B- negative  Acute URI Push fluids Mucinex as needed Zithromax and prednisone as directed.  Promethazine DM as needed for cough -     azithromycin (ZITHROMAX) 250 MG tablet; Take 2 tablets (500 mg) on  Day 1,  followed by 1 tablet (250 mg) once daily on Days 2 through 5. -     promethazine-dextromethorphan (PROMETHAZINE-DM) 6.25-15 MG/5ML syrup; Take 5 mLs by mouth 4 (four) times daily as needed for cough. -     predniSONE (DELTASONE) 20 MG tablet; 3 tablets daily with food for 3 days, 2 tabs daily for 3 days, 1 tab a day for 5 days.  Acute pain of right shoulder Use prednisone taper Rest Continue therapy at chiropractor If pain persists notify the office      Further disposition pending results of labs. Discussed med's effects and SE's.   Over 30 minutes of exam, counseling, chart review, and critical decision making was performed.   Future Appointments  Date Time Provider Department Center  03/30/2023 10:00 AM Cranford, Archie Patten, NP [facility] None    ------------------------------------------------------------------------------------------------------------------   HPI BP 100/64   Pulse 88   Temp 97.7 F (36.5 C)   Ht 5\' 6"  (1.676 m)   Wt 132 lb 12.8 oz (60.2 kg)   SpO2 99%   Breastfeeding No   BMI 21.43 kg/m   42 y.o.female presents for cough of green/yellow mucus, congestion of clear nasal mucus , fever and headaches since 3 days ago.  Denies nausea, vomiting and diarrhea. Has used Ibuprofen and allegra .  Her sister got married this weekend so was around a lot of people.  Currently on Breo, singulair, flonase daily and albuterol as needed for asthma.    BP well controlled without medication BP Readings from Last 3 Encounters:  03/10/23 100/64  09/21/22 115/83  06/02/22 122/68  Denies headaches, chest pain, shortness of breath and dizziness    BMI is Body mass index is 21.43 kg/m., she has been working on diet and exercise. Wt Readings from Last 3 Encounters:  03/10/23 132 lb 12.8 oz (60.2 kg)  09/21/22 140 lb (63.5 kg)  06/02/22 158 lb 3.2 oz (71.8 kg)   She goes to chiropractor every other week.  Has been having pain in right shoulder and they are doing therapy. Questioning possible rotator cuff tear.   Past Medical History:  Diagnosis Date   Anemia    hx of   Asthma    no problems since pregnancy   Breech presentation 04/26/2018   Family history of anesthesia complication    "mom is sensitive- severe n/v"   Fibrocystic breast    Gallstones 09/25/2013   Gestational hypertension 04/26/2018   Pneumonia    age 36   PONV (postoperative nausea and vomiting)    Postpartum care following cesarean delivery (1/8) 04/27/2018   Previous cesarean section 04/26/2018   S/P laparoscopic cholecystectomy June 15 09/25/2013   Single small stone; chronic changes;  Small bile ducts    Seasonal allergies    Vitamin D deficiency      No Known Allergies  Current Outpatient Medications on File Prior to Visit  Medication Sig   acetaminophen (TYLENOL) 500  MG tablet Take 2 tablets (1,000 mg total) by mouth every 4 (four) hours as needed for mild pain.   albuterol (VENTOLIN HFA) 108 (90 Base) MCG/ACT inhaler INHALE 1-2 PUFFS BY MOUTH EVERY 4 HOURS AS NEEDED FOR wheezing OR SHORTNESS OF BREATH   aspirin EC 81 MG tablet Take 81 mg by mouth daily. Swallow whole.   BREO ELLIPTA 100-25 MCG/INH AEPB inhale 1 PUFF into THE lungs ONCE A DAY. RINSE MOUTH WITH WATER AFTER EACH USE   buPROPion (WELLBUTRIN XL) 150 MG 24 hr tablet TAKE 2 TABLETS BY MOUTH EVERY MORNING   Cholecalciferol (VITAMIN D3) 125 MCG (5000 UT) CAPS Take 5,000 Units by mouth at bedtime.    fluticasone (FLONASE) 50 MCG/ACT nasal spray USE 2 SPRAYS IN EACH NOSTRIL EVERY DAY   magnesium oxide (MAG-OX) 400 MG tablet Take 400 mg by mouth at bedtime.   montelukast (SINGULAIR) 10 MG tablet TAKE 1 TABLET BY MOUTH EVERY DAY FOR allergies   ondansetron (ZOFRAN-ODT) 4 MG disintegrating tablet DISSOLVE ONE TABLET in MOUTH 3 TIMES DAILY EVERY SIX TO EIGHT HOURS AS NEEDED FOR NAUSEA AND VOMITING   OVER THE COUNTER MEDICATION Apply 1 application topically at bedtime. Magneium cream(MG-12). Apply to feet at bedtime for restless legs.   tretinoin (RETIN-A) 0.05 % cream APPLY topically TO THE FACE AT BEDTIME   valACYclovir (VALTREX) 1000 MG tablet TAKE 2 TABLETS BY MOUTH 2 TIMES DAILY   ZEPBOUND 7.5 MG/0.5ML Pen INJECT 7.5mg  SUBCUTANEOUSLY ONCE WEEKLY.   meloxicam (MOBIC) 15 MG tablet Take  1/2 to 1 tablet  Daily with Food for Pain & Inflammation (Patient not taking: Reported on 03/10/2023)   phenazopyridine (PYRIDIUM) 200 MG tablet Take 1 tablet (200 mg total) by mouth 3 (three) times daily as needed for pain. (Patient not taking: Reported on 03/10/2023)   No current facility-administered medications on file prior to visit.    ROS: all negative except above.   Physical Exam:  BP 100/64   Pulse 88   Temp 97.7 F (36.5 C)   Ht 5\' 6"  (1.676 m)   Wt 132 lb 12.8 oz (60.2 kg)   SpO2 99%   Breastfeeding No   BMI 21.43 kg/m   General Appearance: Well nourished, in no apparent distress. Eyes: PERRLA, EOMs, conjunctiva no swelling or erythema Sinuses: Positive Frontal/maxillary tenderness ENT/Mouth: Ext aud canals clear, TMs without erythema, bulging. No erythema, swelling, or exudate on post pharynx.  Hearing normal.  Neck: Supple, thyroid normal.  Respiratory: Respiratory effort normal, BS equal bilaterally without rales, rhonchi, wheezing or stridor.  Cardio: RRR with no MRGs. Brisk peripheral pulses without edema.  Abdomen: Soft, + BS.  Non tender, no guarding, rebound, hernias,  masses. Lymphatics: positive right submandibular lymphadenopathy.  Musculoskeletal: Decreased ROM of right shoulder, some pain with pressure.  normal gait.  Skin: Warm, dry without rashes, lesions, ecchymosis.  Neuro: Cranial nerves intact. Normal muscle tone, no cerebellar symptoms. Sensation intact.  Psych: Awake and oriented X 3, normal affect, Insight and Judgment appropriate.     Raynelle Dick, NP 3:00 PM [facility]

## 2023-03-30 ENCOUNTER — Ambulatory Visit (INDEPENDENT_AMBULATORY_CARE_PROVIDER_SITE_OTHER): Payer: Managed Care, Other (non HMO) | Admitting: Nurse Practitioner

## 2023-03-30 ENCOUNTER — Encounter: Payer: Self-pay | Admitting: Nurse Practitioner

## 2023-03-30 VITALS — BP 112/78 | HR 84 | Temp 98.0°F | Ht 66.0 in | Wt 130.4 lb

## 2023-03-30 DIAGNOSIS — E559 Vitamin D deficiency, unspecified: Secondary | ICD-10-CM

## 2023-03-30 DIAGNOSIS — Z1389 Encounter for screening for other disorder: Secondary | ICD-10-CM

## 2023-03-30 DIAGNOSIS — Z0000 Encounter for general adult medical examination without abnormal findings: Secondary | ICD-10-CM

## 2023-03-30 DIAGNOSIS — Z79899 Other long term (current) drug therapy: Secondary | ICD-10-CM

## 2023-03-30 DIAGNOSIS — Z131 Encounter for screening for diabetes mellitus: Secondary | ICD-10-CM | POA: Diagnosis not present

## 2023-03-30 DIAGNOSIS — Z1329 Encounter for screening for other suspected endocrine disorder: Secondary | ICD-10-CM

## 2023-03-30 DIAGNOSIS — L732 Hidradenitis suppurativa: Secondary | ICD-10-CM

## 2023-03-30 DIAGNOSIS — T7840XD Allergy, unspecified, subsequent encounter: Secondary | ICD-10-CM

## 2023-03-30 DIAGNOSIS — Z13220 Encounter for screening for lipoid disorders: Secondary | ICD-10-CM

## 2023-03-30 DIAGNOSIS — J4520 Mild intermittent asthma, uncomplicated: Secondary | ICD-10-CM

## 2023-03-30 DIAGNOSIS — E785 Hyperlipidemia, unspecified: Secondary | ICD-10-CM

## 2023-03-30 DIAGNOSIS — Z85828 Personal history of other malignant neoplasm of skin: Secondary | ICD-10-CM

## 2023-03-30 DIAGNOSIS — Z6821 Body mass index (BMI) 21.0-21.9, adult: Secondary | ICD-10-CM

## 2023-03-30 DIAGNOSIS — Z1231 Encounter for screening mammogram for malignant neoplasm of breast: Secondary | ICD-10-CM

## 2023-03-30 DIAGNOSIS — Z0001 Encounter for general adult medical examination with abnormal findings: Secondary | ICD-10-CM

## 2023-03-30 DIAGNOSIS — F338 Other recurrent depressive disorders: Secondary | ICD-10-CM

## 2023-03-30 MED ORDER — DOXYCYCLINE HYCLATE 100 MG PO TABS
100.0000 mg | ORAL_TABLET | Freq: Two times a day (BID) | ORAL | 0 refills | Status: AC
Start: 2023-03-30 — End: 2023-04-06

## 2023-03-30 NOTE — Progress Notes (Signed)
Complete Physical  Assessment and Plan:  Christie Le was seen today for annual exam.  Diagnoses and all orders for this visit:  Encounter for routine adult health examination with abnormal findings Due annually  Health Maintenance reviewed Healthy lifestyle reviewed and goals set  Hyperlipidemia, unspecified hyperlipidemia type Discussed lifestyle modifications. Recommended diet heavy in fruits and veggies, omega 3's. Decrease consumption of animal meats, cheeses, and dairy products. Remain active and exercise as tolerated. Continue to monitor. Check lipids/TSH  Seasonal affective disorder (HCC) Continue Wellbutrin 150 mg SR daily Lifestyle discussed: diet/exerise, sleep hygiene, stress management, hydration  Mild intermittent asthma without complication Continue inhaler PRN - effective Avoid triggers  Allergy, subsequent encounter Continue OTC allergy pills Avoid triggers  Medication management All medications discussed and reviewed in full. All questions and concerns regarding medications addressed.    Vitamin D deficiency Continue supplement for goal of 60-100 Monitor Vitamin D levels  BMI 21 Continue Wegovy 7.5 mg every 2-3 weeks Reduce dosage once goal weight 130/135 lb consistent Discussed appropriate BMI Diet modification. Physical activity. Encouraged/praised to build confidence.  Screening for hematuria or proteinuria -     Urinalysis, Routine w reflex microscopic  Screening for thyroid disorder -     TSH  Screening for diabetes mellitus -     Hemoglobin A1c  History of basal cell cancer Follow up skin surgery center  Screening for breast cancer Mammogram  Screening for blood or proteinuria Monitor UA/Microalbumin  Hydradenitis Suppurativa Start Doxycycline as directed Continue to monitor for increase in redness, swelling, pustule. Notify office if s/s do not respond to treatment  Orders Placed This Encounter  Procedures   CBC with  Differential/Platelet   COMPLETE METABOLIC PANEL WITH GFR   Lipid panel   TSH   Hemoglobin A1c   Insulin, random   VITAMIN D 25 Hydroxy (Vit-D Deficiency, Fractures)   Urinalysis, Routine w reflex microscopic   Microalbumin / creatinine urine ratio   Meds ordered this encounter  Medications   doxycycline (VIBRA-TABS) 100 MG tablet    Sig: Take 1 tablet (100 mg total) by mouth 2 (two) times daily for 7 days.    Dispense:  14 tablet    Refill:  0    Order Specific Question:   Supervising Provider    Answer:   Christie Le [phone]    Notify office for further evaluation and treatment, questions or concerns if any reported s/s fail to improve.   The patient was advised to call back or seek an in-person evaluation if any symptoms worsen or if the condition fails to improve as anticipated.   Further disposition pending results of labs. Discussed med's effects and SE's.    I discussed the assessment and treatment plan with the patient. The patient was provided an opportunity to ask questions and all were answered. The patient agreed with the plan and demonstrated an understanding of the instructions.  Discussed med's effects and SE's. Screening labs and tests as requested with regular follow-up as recommended.  I provided 40 minutes of face-to-face time during this encounter including counseling, chart review, and critical decision making was preformed.  Today's Plan of Care is based on a patient-centered health care approach known as shared decision making - the decisions, tests and treatments allow for patient preferences and values to be balanced with clinical evidence.    Future Appointments  Date Time Provider Department Center  03/30/2024 10:00 AM Christie Glimpse, NP [facility] None    HPI  42 y.o. female  presents for a complete physical. She has Allergies; Seasonal affective disorder (HCC); Vitamin D deficiency; Ulnar nerve entrapment, left; History of dysplastic nevus;  Snoring; Non-restorative sleep; Hyperlipidemia; Asthma; Inflamed skin tag; Herniation of intervertebral disc between L5 and S1; Medication management; History of basal cell cancer; COVID-19; and Palpitations on their problem list.  She married, with 31 old twins daughters, also Christie Le, 2.5 y/o. Some stress with MIL.  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  Has had increase in anxiety along with taking care of aging grandparents.    Restarted Wellbutirn with effectiveness.  She has tried [name] in the past but is unable to tolerate d/t SE.   She was recently seen in ER for palpitations.  Has now followed with Cardiology.  EKG and CXR WNL.  PVC's.  Has reduced intake of caffeine which has helped.  She follows with Christie Le at [facility] for GYN, has mirena placed 2020.  UTD on pap.  She does not experience menstruation.  She notices increase in left axilla nodules, at times pustule type with drainage.  Nodules have hardened over time, continue to be present.  Will flare intermittently.  Feels this may have been related to Covid.  UTD on mammogram 03/2023, plans to schedule f/u.  Performs self breast exams without any concern.  Hx of BCC of left breast wall removed at skin surgery in 2019.     She takes singulair daily, breo/albuterol PRN for mild intermittent asthma. Certain smells and allergies trigger.   She has GERD, improved with lifestyle, off of PPI.   BMI is Body mass index is 21.05 kg/m., she has been working on diet and exercise. She has done well with optavia and Wegovy.  Wt Readings from Last 3 Encounters:  03/30/23 130 lb 6.4 oz (59.1 kg)  03/10/23 132 lb 12.8 oz (60.2 kg)  09/21/22 140 lb (63.5 kg)   Her blood pressure has been controlled at home, today their BP is BP: 112/78 She does workout, 3-4 times a week. She denies chest pain, shortness of breath, dizziness.   She is not on  cholesterol medication and denies myalgias. Her cholesterol is at goal. The cholesterol last visit was:   Lab Results  Component Value Date   CHOL 211 (H) 03/27/2022   HDL 52 03/27/2022   LDLCALC 144 (H) 03/27/2022   TRIG 55 03/27/2022   CHOLHDL 4.1 03/27/2022   Last Z6X in the office was:  Lab Results  Component Value Date   HGBA1C 5.6 03/27/2022   Patient is on Vitamin D supplement, she is on 5000 IU- has been on 10,000 last few weeks.    Lab Results  Component Value Date   VD25OH 37 03/27/2022     Current Medications:     Current Outpatient Medications (Respiratory):    albuterol (VENTOLIN HFA) 108 (90 Base) MCG/ACT inhaler, INHALE 1-2 PUFFS BY MOUTH EVERY 4 HOURS AS NEEDED FOR wheezing OR SHORTNESS OF BREATH   BREO ELLIPTA 100-25 MCG/INH AEPB, inhale 1 PUFF into THE lungs ONCE A DAY. RINSE MOUTH WITH WATER AFTER EACH USE   Fexofenadine HCl (ALLEGRA PO), Take by mouth.   fluticasone (FLONASE) 50 MCG/ACT nasal spray, USE 2 SPRAYS IN EACH NOSTRIL EVERY DAY   montelukast (SINGULAIR) 10 MG tablet, TAKE 1 TABLET BY MOUTH EVERY DAY FOR allergies   promethazine-dextromethorphan (PROMETHAZINE-DM) 6.25-15 MG/5ML syrup, Take 5 mLs by mouth 4 (four) times daily as  needed for cough. (Patient not taking: Reported on 03/30/2023)  Current Outpatient Medications (Analgesics):    acetaminophen (TYLENOL) 500 MG tablet, Take 2 tablets (1,000 mg total) by mouth every 4 (four) hours as needed for mild pain.   aspirin EC 81 MG tablet, Take 81 mg by mouth daily. Swallow whole.   IBUPROFEN PO, Take by mouth.   meloxicam (MOBIC) 15 MG tablet, Take  1/2 to 1 tablet  Daily with Food for Pain & Inflammation (Patient not taking: Reported on 03/10/2023)   Current Outpatient Medications (Other):    buPROPion (WELLBUTRIN XL) 150 MG 24 hr tablet, TAKE 2 TABLETS BY MOUTH EVERY MORNING   Cholecalciferol (VITAMIN D3) 125 MCG (5000 UT) CAPS, Take 5,000 Units by mouth at bedtime.   magnesium oxide (MAG-OX)  400 MG tablet, Take 400 mg by mouth at bedtime.   ondansetron (ZOFRAN-ODT) 4 MG disintegrating tablet, DISSOLVE ONE TABLET in MOUTH 3 TIMES DAILY EVERY SIX TO EIGHT HOURS AS NEEDED FOR NAUSEA AND VOMITING   OVER THE COUNTER MEDICATION, Apply 1 application topically at bedtime. Magneium cream(MG-12). Apply to feet at bedtime for restless legs.   tretinoin (RETIN-A) 0.05 % cream, APPLY topically TO THE FACE AT BEDTIME   valACYclovir (VALTREX) 1000 MG tablet, TAKE 2 TABLETS BY MOUTH 2 TIMES DAILY   ZEPBOUND 7.5 MG/0.5ML Pen, INJECT 7.5mg  SUBCUTANEOUSLY ONCE WEEKLY.   azithromycin (ZITHROMAX) 250 MG tablet, Take 2 tablets (500 mg) on  Day 1,  followed by 1 tablet (250 mg) once daily on Days 2 through 5.  Health Maintenance:   Immunization History  Administered Date(s) Administered   Influenza Inj Mdck Quad Pf 02/17/2021   Influenza,inj,Quad PF,6+ Mos 01/21/2018, 02/09/2019, 01/14/2022, 01/22/2023   Influenza-Unspecified 11/21/2012, 01/21/2018, 02/09/2019   Moderna Covid-19 Vaccine Bivalent Booster 57yrs & up 02/18/2021   PFIZER(Purple Top)SARS-COV-2 Vaccination 05/12/2019, 06/02/2019, 02/20/2020   Pfizer Covid-19 Vaccine Bivalent Booster 73yrs & up 01/27/2022   Pfizer(Comirnaty)Fall Seasonal Vaccine 12 years and older 01/27/2022   Tdap 05/10/2012, 01/18/2018   LMP:  No cycle d/t Mirena  Tetanus: 2019 Pneumovax: N/A Flu vaccine: UTD for 2024 gets at pharmacy  Covid 19: 2/2 pfizer will get new booster  Pap: Dr. Mitzi Hansen- Mirena, neg PAP 06/29/2020 2020 placed  LMP:  No Period  Hx abnormal pap in 2018- colposcopy normal.   MGM: Due.  Order placed.  Colonoscopy: N/A EGD: N.A   Last Dental Exam: [facility], Dr. Irene Limbo 2024 Last Eye Exam: None, has been [facility] in the past 2023  Patient Care Team: Christie Cowboy, MD as PCP - General (Internal Medicine)  Medical History:  Past Medical History:  Diagnosis Date   Anemia    hx of   Asthma    no problems since  pregnancy   Breech presentation 04/26/2018   Family history of anesthesia complication    "mom is sensitive- severe n/v"   Fibrocystic breast    Gallstones 09/25/2013   Gestational hypertension 04/26/2018   Pneumonia    age 69   PONV (postoperative nausea and vomiting)    Postpartum care following cesarean delivery (1/8) 04/27/2018   Previous cesarean section 04/26/2018   S/P laparoscopic cholecystectomy June 15 09/25/2013   Single small stone; chronic changes;  Small bile ducts    Seasonal allergies    Vitamin D deficiency    Allergies No Known Allergies  SURGICAL HISTORY She  has a past surgical history that includes Cesarean section (07/29/2009); Wisdom tooth extraction (2002); Cholecystectomy (N/A, 09/25/2013); and Cesarean  section (N/A, 04/26/2018). FAMILY HISTORY Her family history includes Basal cell carcinoma (age of onset: 90) in her mother; Breast cancer in her maternal grandmother; CAD in her paternal grandfather; COPD in her maternal grandmother; Cancer in her father; Dementia in her maternal grandfather; Diabetes in her father; Heart disease in her maternal grandmother; Hypertension in her maternal grandfather, mother, and another family member; Lung cancer in her maternal grandmother. SOCIAL HISTORY She  reports that she has never smoked. She has never used smokeless tobacco. She reports that she does not currently use alcohol. She reports that she does not use drugs.  Review of Systems: Review of Systems  Constitutional:  Negative for malaise/fatigue and weight loss.  HENT:  Negative for hearing loss and tinnitus.   Eyes:  Negative for blurred vision and double vision.  Respiratory:  Negative for cough, sputum production, shortness of breath and wheezing.   Cardiovascular:  Negative for chest pain, palpitations, orthopnea, claudication, leg swelling and PND.  Gastrointestinal:  Negative for abdominal pain, blood in stool, constipation, diarrhea, heartburn, melena, nausea and vomiting.   Genitourinary: Negative.   Musculoskeletal:  Negative for back pain, joint pain and myalgias.  Skin:  Negative for rash.  Neurological:  Negative for dizziness, tingling, sensory change, weakness and headaches.  Endo/Heme/Allergies:  Negative for polydipsia.  Psychiatric/Behavioral:  Positive for depression (seasonal). Negative for memory loss, substance abuse and suicidal ideas. The patient is nervous/anxious. The patient does not have insomnia.   All other systems reviewed and are negative.   Physical Exam: Estimated body mass index is 21.05 kg/m as calculated from the following:   Height as of this encounter: 5\' 6"  (1.676 m).   Weight as of this encounter: 130 lb 6.4 oz (59.1 kg). BP 112/78   Pulse 84   Temp 98 F (36.7 C)   Ht 5\' 6"  (1.676 m)   Wt 130 lb 6.4 oz (59.1 kg)   SpO2 99%   BMI 21.05 kg/m  General Appearance: Well nourished, in no apparent distress.  Eyes: PERRLA, EOMs, conjunctiva no swelling or erythema, normal fundi and vessels.  Sinuses: No Frontal/maxillary tenderness  ENT/Mouth: Ext aud canals clear, normal light reflex with TMs without erythema, bulging. Good dentition. No erythema, swelling, or exudate on post pharynx. Tonsils not enlarged. Hearing normal.  Neck: Supple, thyroid normal. No bruits  Respiratory: Respiratory effort normal, BS equal bilaterally without rales, rhonchi, wheezing or stridor.  Cardio: RRR without murmurs, rubs or gallops. Brisk peripheral pulses without edema.  Chest: symmetric, with normal excursions and percussion.  Breasts: defer OB/GYN Abdomen: Soft, nontender, no guarding, rebound, hernias, masses, or organomegaly. .  Lymphatics: Non tender without lymphadenopathy.  Genitourinary: defer OB/GYN Musculoskeletal: Full ROM all peripheral extremities,5/5 strength, and normal gait.  Skin: Warm, dry without rashes, lesions, ecchymosis. Neuro: Cranial nerves intact, reflexes equal bilaterally. Normal muscle tone, no cerebellar  symptoms. Sensation intact.  Psych: Awake and oriented X 3, normal affect, Insight and Judgment appropriate.   EKG: defer recent one with Cardiology 09/2022  [facility] 10:17 AM [facility]

## 2023-03-30 NOTE — Patient Instructions (Signed)
Hidradenitis Suppurativa Hidradenitis suppurativa is a long-term (chronic) skin disease. It is similar to a severe form of acne, but it affects areas of the body where acne would be unusual, especially areas of the body where skin rubs against skin and becomes moist. These include: Underarms. Groin. Genital area. Buttocks. Upper thighs. Breasts. Hidradenitis suppurativa may start out as small lumps or pimples caused by blocked skin pores, sweat glands, or hair follicles. Pimples may develop into deep sores that break open (rupture) and drain pus. Over time, affected areas of skin may thicken and become scarred. This condition is rare and does not spread from person to person (non-contagious). What are the causes? The exact cause of this condition is not known. It may be related to: Female and female hormones. An overactive disease-fighting system (immune system). The immune system may over-react to blocked hair follicles or sweat glands and cause swelling and pus-filled sores. What increases the risk? You are more likely to develop this condition if you: Are female. Are 11-55 years old. Have a family history of hidradenitis suppurativa. Have a personal history of acne. Are overweight. Smoke. Take the medicine lithium. What are the signs or symptoms? The first symptoms are usually painful bumps in the skin, similar to pimples. The condition may get worse over time (progress), or it may only cause mild symptoms. If the disease progresses, symptoms may include: Skin bumps getting bigger and growing deeper into the skin. Bumps rupturing and draining pus. Itchy, infected skin. Skin getting thicker and scarred. Tunnels under the skin (fistulas) where pus drains from a bump. Pain during daily activities, such as pain during walking if your groin area is affected. Emotional problems, such as stress or depression. This condition may affect your appearance and your ability or willingness to wear  certain clothes or do certain activities. How is this diagnosed? This condition is diagnosed by a health care provider who specializes in skin conditions (dermatologist). You may be diagnosed based on: Your symptoms and medical history. A physical exam. Testing a pus sample for infection. Blood tests. How is this treated? Your treatment will depend on how severe your symptoms are. The same treatment will not work for everybody with this condition. You may need to try several treatments to find what works best for you. Treatment may include: Cleaning and bandaging (dressing) your wounds as needed. Lifestyle changes, such as new skin care routines. Taking medicines, such as: Antibiotics. Acne medicines. Medicines to reduce the activity of the immune system. A diabetes medicine (metformin). Birth control pills, for women. Steroids to reduce swelling and pain. Working with a mental health care provider, if you experience emotional distress due to this condition. If you have severe symptoms that do not get better with medicine, you may need surgery. Surgery may involve: Using a laser to clear the skin and remove hair follicles. Opening and draining deep sores. Removing the areas of skin that are diseased and scarred. Follow these instructions at home: Medicines  Take over-the-counter and prescription medicines only as told by your health care provider. If you were prescribed antibiotics, take them as told by your health care provider. Do not stop using the antibiotic even if your condition improves. Skin care If you have open wounds, cover them with a clean dressing as told by your health care provider. Keep wounds clean by washing them gently with soap and water when you bathe. Do not shave the areas where you get hidradenitis suppurativa. Wear loose-fitting clothes. Try to avoid   getting overheated or sweaty. If you get sweaty or wet, change into clean, dry clothes as soon as you can. To  help relieve pain and itchiness, cover sore areas with a warm, clean washcloth (warm compress) for 5-10 minutes as often as needed. Your healthcare provider may recommend an antiperspirant deodorant that may be gentle on your skin. A daily antiseptic wash to cleanse affected areas may be suggested by your healthcare provider. General instructions Learn as much as you can about your disease so that you have an active role in your treatment. Work closely with your health care provider to find treatments that work for you. If you are overweight, work with your health care provider to lose weight as recommended. Do not use any products that contain nicotine or tobacco. These products include cigarettes, chewing tobacco, and vaping devices, such as e-cigarettes. If you need help quitting, ask your health care provider. If you struggle with living with this condition, talk with your health care provider or work with a mental health care provider as recommended. Keep all follow-up visits. Where to find more information Hidradenitis Suppurativa Foundation, Inc.: www.hs-foundation.org American Academy of Dermatology: www.aad.org Contact a health care provider if: You have a flare-up of hidradenitis suppurativa. You have a fever or chills. You have trouble controlling your symptoms at home. You have trouble doing your daily activities because of your symptoms. You have trouble dealing with emotional problems related to your condition. Summary Hidradenitis suppurativa is a long-term (chronic) skin disease. It is similar to a severe form of acne, but it affects areas of the body where acne would be unusual. The first symptoms are usually painful bumps in the skin, similar to pimples. The condition may only cause mild symptoms, or it may get worse over time (progress). If you have open wounds, cover them with a clean dressing as told by your health care provider. Keep wounds clean by washing them gently with  soap and water when you bathe. Besides skin care, treatment may include medicines, laser treatment, and surgery. This information is not intended to replace advice given to you by your health care provider. Make sure you discuss any questions you have with your health care provider. Document Revised: 05/28/2021 Document Reviewed: 05/28/2021 Elsevier Patient Education  2024 Elsevier Inc.  

## 2023-03-31 LAB — TSH: TSH: 1.32 m[IU]/L

## 2023-03-31 LAB — COMPLETE METABOLIC PANEL WITH GFR
AG Ratio: 1.7 (calc) (ref 1.0–2.5)
ALT: 7 U/L (ref 6–29)
AST: 12 U/L (ref 10–30)
Albumin: 4.7 g/dL (ref 3.6–5.1)
Alkaline phosphatase (APISO): 51 U/L (ref 31–125)
BUN: 19 mg/dL (ref 7–25)
CO2: 26 mmol/L (ref 20–32)
Calcium: 9.6 mg/dL (ref 8.6–10.2)
Chloride: 107 mmol/L (ref 98–110)
Creat: 0.88 mg/dL (ref 0.50–0.99)
Globulin: 2.8 g/dL (ref 1.9–3.7)
Glucose, Bld: 81 mg/dL (ref 65–99)
Potassium: 4.1 mmol/L (ref 3.5–5.3)
Sodium: 141 mmol/L (ref 135–146)
Total Bilirubin: 0.4 mg/dL (ref 0.2–1.2)
Total Protein: 7.5 g/dL (ref 6.1–8.1)
eGFR: 85 mL/min/{1.73_m2} (ref >=60)

## 2023-03-31 LAB — CBC WITH DIFFERENTIAL/PLATELET
Absolute Lymphocytes: 2169 {cells}/uL (ref 850–3900)
Absolute Monocytes: 313 {cells}/uL (ref 200–950)
Basophils Absolute: 61 {cells}/uL (ref 0–200)
Basophils Relative: 0.9 %
Eosinophils Absolute: 48 {cells}/uL (ref 15–500)
Eosinophils Relative: 0.7 %
HCT: 39.7 % (ref 35.0–45.0)
Hemoglobin: 12.9 g/dL (ref 11.7–15.5)
MCH: 29.6 pg (ref 27.0–33.0)
MCHC: 32.5 g/dL (ref 32.0–36.0)
MCV: 91.1 fL (ref 80.0–100.0)
MPV: 11.6 fL (ref 7.5–12.5)
Monocytes Relative: 4.6 %
Neutro Abs: 4209 {cells}/uL (ref 1500–7800)
Neutrophils Relative %: 61.9 %
Platelets: 290 10*3/uL (ref 140–400)
RBC: 4.36 10*6/uL (ref 3.80–5.10)
RDW: 12.1 % (ref 11.0–15.0)
Total Lymphocyte: 31.9 %
WBC: 6.8 10*3/uL (ref 3.8–10.8)

## 2023-03-31 LAB — HEMOGLOBIN A1C
Hgb A1c MFr Bld: 5.3 %{Hb} (ref <5.7)
Mean Plasma Glucose: 105 mg/dL
eAG (mmol/L): 5.8 mmol/L

## 2023-03-31 LAB — URINALYSIS, ROUTINE W REFLEX MICROSCOPIC
Bilirubin Urine: NEGATIVE
Glucose, UA: NEGATIVE
Hgb urine dipstick: NEGATIVE
Ketones, ur: NEGATIVE
Leukocytes,Ua: NEGATIVE
Nitrite: NEGATIVE
Protein, ur: NEGATIVE
Specific Gravity, Urine: 1.024 (ref 1.001–1.035)
pH: 5.5 (ref 5.0–8.0)

## 2023-03-31 LAB — MICROALBUMIN / CREATININE URINE RATIO
Creatinine, Urine: 145 mg/dL (ref 20–275)
Microalb Creat Ratio: 3 mg/g{creat} (ref <30)
Microalb, Ur: 0.5 mg/dL

## 2023-03-31 LAB — INSULIN, RANDOM: Insulin: 6.9 u[IU]/mL

## 2023-03-31 LAB — LIPID PANEL
Cholesterol: 180 mg/dL (ref <200)
HDL: 45 mg/dL — ABNORMAL LOW (ref >=50)
LDL Cholesterol (Calc): 118 mg/dL — ABNORMAL HIGH
Non-HDL Cholesterol (Calc): 135 mg/dL — ABNORMAL HIGH (ref <130)
Total CHOL/HDL Ratio: 4 (calc) (ref <5.0)
Triglycerides: 75 mg/dL (ref <150)

## 2023-03-31 LAB — VITAMIN D 25 HYDROXY (VIT D DEFICIENCY, FRACTURES): Vit D, 25-Hydroxy: 39 ng/mL (ref 30–100)

## 2023-04-12 ENCOUNTER — Other Ambulatory Visit: Payer: Self-pay | Admitting: Nurse Practitioner

## 2023-04-23 ENCOUNTER — Other Ambulatory Visit: Payer: Self-pay | Admitting: Nurse Practitioner

## 2023-04-23 DIAGNOSIS — E663 Overweight: Secondary | ICD-10-CM

## 2023-05-06 ENCOUNTER — Telehealth: Payer: Self-pay

## 2023-05-06 NOTE — Telephone Encounter (Signed)
PA completed and denied

## 2023-05-20 ENCOUNTER — Other Ambulatory Visit: Payer: Self-pay | Admitting: Nurse Practitioner

## 2023-05-20 DIAGNOSIS — L732 Hidradenitis suppurativa: Secondary | ICD-10-CM

## 2023-05-24 ENCOUNTER — Other Ambulatory Visit: Payer: Self-pay

## 2023-05-24 DIAGNOSIS — Z7982 Long term (current) use of aspirin: Secondary | ICD-10-CM | POA: Diagnosis not present

## 2023-05-24 DIAGNOSIS — R1031 Right lower quadrant pain: Secondary | ICD-10-CM | POA: Diagnosis present

## 2023-05-24 DIAGNOSIS — R109 Unspecified abdominal pain: Secondary | ICD-10-CM | POA: Diagnosis present

## 2023-05-24 DIAGNOSIS — Z5321 Procedure and treatment not carried out due to patient leaving prior to being seen by health care provider: Secondary | ICD-10-CM | POA: Insufficient documentation

## 2023-05-24 DIAGNOSIS — N23 Unspecified renal colic: Secondary | ICD-10-CM | POA: Diagnosis not present

## 2023-05-24 LAB — URINALYSIS, ROUTINE W REFLEX MICROSCOPIC
Bilirubin Urine: NEGATIVE
Glucose, UA: NEGATIVE mg/dL
Ketones, ur: 15 mg/dL — AB
Leukocytes,Ua: NEGATIVE
Nitrite: POSITIVE — AB
Protein, ur: NEGATIVE mg/dL
Specific Gravity, Urine: >=1.03 (ref 1.005–1.030)
pH: 5 (ref 5.0–8.0)

## 2023-05-24 LAB — URINALYSIS, MICROSCOPIC (REFLEX)

## 2023-05-24 LAB — PREGNANCY, URINE: Preg Test, Ur: NEGATIVE

## 2023-05-24 NOTE — ED Triage Notes (Signed)
Pt with c/o severe lower right flank pain radiating to RLQ. PT has felt bloated all day and this evening started having flank pain and abd pain. Pt has " uncomfortable pressure" when urinating. Denies blood in urine. Pt took pyridium with no relief

## 2023-05-25 ENCOUNTER — Emergency Department (HOSPITAL_BASED_OUTPATIENT_CLINIC_OR_DEPARTMENT_OTHER)
Admission: EM | Admit: 2023-05-25 | Discharge: 2023-05-25 | Disposition: A | Discharge status: Home / Self Care | Payer: 59 | Source: Home / Self Care | Attending: Emergency Medicine | Admitting: Emergency Medicine

## 2023-05-25 ENCOUNTER — Emergency Department (HOSPITAL_BASED_OUTPATIENT_CLINIC_OR_DEPARTMENT_OTHER): Payer: 59

## 2023-05-25 ENCOUNTER — Emergency Department (HOSPITAL_BASED_OUTPATIENT_CLINIC_OR_DEPARTMENT_OTHER)
Admission: EM | Admit: 2023-05-25 | Discharge: 2023-05-25 | Discharge status: Against Medical Advice | Payer: 59 | Attending: Emergency Medicine | Admitting: Emergency Medicine

## 2023-05-25 ENCOUNTER — Other Ambulatory Visit (HOSPITAL_BASED_OUTPATIENT_CLINIC_OR_DEPARTMENT_OTHER): Payer: Self-pay

## 2023-05-25 ENCOUNTER — Other Ambulatory Visit: Payer: Self-pay

## 2023-05-25 ENCOUNTER — Encounter (HOSPITAL_BASED_OUTPATIENT_CLINIC_OR_DEPARTMENT_OTHER): Payer: Self-pay | Admitting: Emergency Medicine

## 2023-05-25 DIAGNOSIS — Z7982 Long term (current) use of aspirin: Secondary | ICD-10-CM | POA: Insufficient documentation

## 2023-05-25 DIAGNOSIS — N23 Unspecified renal colic: Secondary | ICD-10-CM | POA: Insufficient documentation

## 2023-05-25 LAB — URINALYSIS, ROUTINE W REFLEX MICROSCOPIC
Glucose, UA: NEGATIVE mg/dL
Ketones, ur: NEGATIVE mg/dL
Leukocytes,Ua: NEGATIVE
Nitrite: POSITIVE — AB
Protein, ur: NEGATIVE mg/dL
Specific Gravity, Urine: 1.022 (ref 1.005–1.030)
pH: 5.5 (ref 5.0–8.0)

## 2023-05-25 LAB — CBC WITH DIFFERENTIAL/PLATELET
Abs Immature Granulocytes: 0.02 10*3/uL (ref 0.00–0.07)
Basophils Absolute: 0.1 10*3/uL (ref 0.0–0.1)
Basophils Relative: 1 %
Eosinophils Absolute: 0 10*3/uL (ref 0.0–0.5)
Eosinophils Relative: 0 %
HCT: 37.3 % (ref 36.0–46.0)
Hemoglobin: 12.5 g/dL (ref 12.0–15.0)
Immature Granulocytes: 0 %
Lymphocytes Relative: 17 %
Lymphs Abs: 1.5 10*3/uL (ref 0.7–4.0)
MCH: 29.8 pg (ref 26.0–34.0)
MCHC: 33.5 g/dL (ref 30.0–36.0)
MCV: 89 fL (ref 80.0–100.0)
Monocytes Absolute: 0.4 10*3/uL (ref 0.1–1.0)
Monocytes Relative: 4 %
Neutro Abs: 6.6 10*3/uL (ref 1.7–7.7)
Neutrophils Relative %: 78 %
Platelets: 270 10*3/uL (ref 150–400)
RBC: 4.19 MIL/uL (ref 3.87–5.11)
RDW: 12.1 % (ref 11.5–15.5)
WBC: 8.6 10*3/uL (ref 4.0–10.5)
nRBC: 0 % (ref 0.0–0.2)

## 2023-05-25 LAB — BASIC METABOLIC PANEL
Anion gap: 9 (ref 5–15)
BUN: 15 mg/dL (ref 6–20)
CO2: 25 mmol/L (ref 22–32)
Calcium: 9.4 mg/dL (ref 8.9–10.3)
Chloride: 104 mmol/L (ref 98–111)
Creatinine, Ser: 1.07 mg/dL — ABNORMAL HIGH (ref 0.44–1.00)
GFR, Estimated: >60 mL/min (ref >60)
Glucose, Bld: 96 mg/dL (ref 70–99)
Potassium: 3.9 mmol/L (ref 3.5–5.1)
Sodium: 138 mmol/L (ref 135–145)

## 2023-05-25 LAB — PREGNANCY, URINE: Preg Test, Ur: NEGATIVE

## 2023-05-25 MED ORDER — ONDANSETRON 4 MG PO TBDP
4.0000 mg | ORAL_TABLET | Freq: Three times a day (TID) | ORAL | 0 refills | Status: AC | PRN
  Filled 2023-05-25: qty 10, 4d supply, fill #0

## 2023-05-25 MED ORDER — DICLOFENAC SODIUM 50 MG PO TBEC
50.0000 mg | DELAYED_RELEASE_TABLET | Freq: Two times a day (BID) | ORAL | 0 refills | Status: AC
  Filled 2023-05-25: qty 10, 5d supply, fill #0

## 2023-05-25 MED ORDER — KETOROLAC TROMETHAMINE 15 MG/ML IJ SOLN
15.0000 mg | Freq: Once | INTRAMUSCULAR | Status: AC
  Administered 2023-05-25: 15 mg via INTRAVENOUS
  Filled 2023-05-25: qty 1

## 2023-05-25 MED ORDER — OXYCODONE HCL 5 MG PO TABS
5.0000 mg | ORAL_TABLET | Freq: Four times a day (QID) | ORAL | 0 refills | Status: AC | PRN
  Filled 2023-05-25: qty 5, 2d supply, fill #0

## 2023-05-25 NOTE — ED Triage Notes (Signed)
Pt caox4, ambulatory c/o R flank pain that started at 1930 last night, denies N/V, denies fever, denies PMH kidney stones. Went to [facility] last night for same, had urinalysis done, LWBS.

## 2023-05-25 NOTE — ED Notes (Signed)
Discharge instructions and follow up care reviewed and explained, pt verbalized understanding and had no further questions on d/c.Pt caox4, ambulatory on d/c.

## 2023-05-25 NOTE — Discharge Instructions (Addendum)
 Please read and follow all provided instructions.  Your diagnoses today include:  1. Ureteral colic     Tests performed today include: Urine test that showed blood in your urine and no infection CT scan which showed a 1-1.5 millimeter kidney stone on the right side, also with question of malpositioned IUD Blood test that showed normal kidney function Vital signs. See below for your results today.   Medications prescribed:  Oxycodone  - narcotic pain medication  DO NOT drive or perform any activities that require you to be awake and alert because this medicine can make you drowsy.   Zofran  (ondansetron ) - for nausea and vomiting  Take any prescribed medications only as directed.  Home care instructions:  Follow any educational materials contained in this packet.  Please double your fluid intake for the next several days. Strain your urine and save any stones that may pass.   BE VERY CAREFUL not to take multiple medicines containing Tylenol  (also called acetaminophen ). Doing so can lead to an overdose which can damage your liver and cause liver failure and possibly death.   Follow-up instructions: Please follow-up with your urologist or the urologist referral (provided on front page) in the next 1 week for further evaluation of your symptoms.  Return instructions:  Please return to the Emergency Department if you experience worsening symptoms.  Please return if you develop fever or uncontrolled pain or vomiting. Please return if you have any other emergent concerns.  Additional Information:  Your vital signs today were: BP (!) 132/90 (BP Location: Right Arm)   Pulse 80   Temp 98.9 F (37.2 C)   Resp 17   Ht 5' 6 (1.676 m)   Wt 57.6 kg   SpO2 100%   BMI 20.50 kg/m  If your blood pressure (BP) was elevated above 135/85 this visit, please have this repeated by your doctor within one month. --------------

## 2023-05-25 NOTE — ED Provider Notes (Signed)
 [place] EMERGENCY DEPARTMENT AT [facility] Provider Note   CSN: [id number] Arrival date & time: 05/25/23  9193     History  Chief Complaint  Patient presents with   Flank Pain    Christie Le is a 43 y.o. female.  Patient with past surgical history of cholecystectomy, cesarean sections --presents to the emergency department today for evaluation of right-sided flank pain.  Symptoms started last evening.  Started rather abruptly.  Pain has been severe at times.  Patient went to med [facility], had UA, left without being seen.  Symptoms persisted this morning.  She has had nausea without vomiting.  Pain radiates around to the right lower quadrant.  No history of kidney stones.  She has tried pyridium  this morning.        Home Medications Prior to Admission medications   Medication Sig Start Date End Date Taking? Authorizing Provider  ZEPBOUND  2.5 MG/0.5ML Pen Inject 2.5 mg into the skin once a week. 05/10/23  Yes [provider]  acetaminophen  (TYLENOL ) 500 MG tablet Take 2 tablets (1,000 mg total) by mouth every 4 (four) hours as needed for mild pain. 04/30/18   Paul, Daniela C, CNM  albuterol  (VENTOLIN  HFA) 108 (90 Base) MCG/ACT inhaler INHALE 1-2 PUFFS BY MOUTH EVERY 4 HOURS AS NEEDED FOR wheezing OR SHORTNESS OF BREATH 01/20/23   Wilkinson, Dana E, NP  aspirin EC 81 MG tablet Take 81 mg by mouth daily. Swallow whole.    [provider]  BREO ELLIPTA  100-25 MCG/INH AEPB inhale 1 PUFF into THE lungs ONCE A DAY. RINSE MOUTH WITH WATER AFTER [facility] USE 07/04/18   Craig Alan SAUNDERS, PA-C  buPROPion  (WELLBUTRIN  XL) 150 MG 24 hr tablet TAKE 2 CAPSULES BY MOUTH EVERY MORNING 04/12/23   Wilkinson, Dana E, NP  Cholecalciferol (VITAMIN D3) 125 MCG (5000 UT) CAPS Take 5,000 Units by mouth at bedtime.    [provider]  Fexofenadine  HCl (ALLEGRA PO) Take by mouth.    [provider]  fluticasone  (FLONASE ) 50 MCG/ACT nasal spray USE 2 SPRAYS  IN EACH NOSTRIL EVERY DAY 08/18/22   Wilkinson, Dana E, NP  IBUPROFEN  PO Take by mouth.    [provider]  magnesium oxide (MAG-OX) 400 MG tablet Take 400 mg by mouth at bedtime.    [provider]  meloxicam  (MOBIC ) 15 MG tablet Take  1/2 to 1 tablet  Daily with Food for Pain & Inflammation Patient not taking: Reported on 03/10/2023 08/19/20   Tonita Fallow, MD  montelukast  (SINGULAIR ) 10 MG tablet TAKE 1 TABLET BY MOUTH EVERY DAY FOR allergies 08/12/22   Wilkinson, Dana E, NP  ondansetron  (ZOFRAN -ODT) 4 MG disintegrating tablet DISSOLVE ONE TABLET in MOUTH 3 TIMES DAILY EVERY SIX TO EIGHT HOURS AS NEEDED FOR NAUSEA AND VOMITING 03/02/23   Wilkinson, Dana E, NP  OVER THE COUNTER MEDICATION Apply 1 application topically at bedtime. Magneium cream(MG-12). Apply to feet at bedtime for restless legs.    [provider]  promethazine -dextromethorphan (PROMETHAZINE -DM) 6.25-15 MG/5ML syrup Take 5 mLs by mouth 4 (four) times daily as needed for cough. Patient not taking: Reported on 03/30/2023 03/10/23   Wilkinson, Dana E, NP  tretinoin  (RETIN-A ) 0.05 % cream APPLY topically TO THE FACE AT BEDTIME 06/23/22   Tonita Fallow, MD  valACYclovir  (VALTREX ) 1000 MG tablet TAKE 2 TABLETS BY MOUTH 2 TIMES DAILY 05/14/22   Cranford, Tonya, NP  ZEPBOUND  7.5 MG/0.5ML Pen INJECT 7.5mg  SUBCUTANEOUSLY ONCE WEEKLY. 04/24/23  Wilkinson, Dana E, NP      Allergies    Patient has no known allergies.    Review of Systems   Review of Systems  Physical Exam Updated Vital Signs BP (!) 132/90 (BP Location: Right Arm)   Pulse 80   Temp 98.9 F (37.2 C)   Resp 17   Ht 5' 6 (1.676 m)   Wt 57.6 kg   SpO2 100%   BMI 20.50 kg/m   Physical Exam Vitals and nursing note reviewed.  Constitutional:      General: She is in acute distress.     Appearance: She is well-developed.     Comments: Patient looks uncomfortable, cannot find a comfortable position  HENT:     Head: Normocephalic and  atraumatic.     Right Ear: External ear normal.     Left Ear: External ear normal.     Nose: Nose normal.     Mouth/Throat:     Mouth: Mucous membranes are moist.  Eyes:     Conjunctiva/sclera: Conjunctivae normal.  Cardiovascular:     Rate and Rhythm: Normal rate and regular rhythm.     Heart sounds: No murmur heard. Pulmonary:     Effort: No respiratory distress.     Breath sounds: No wheezing, rhonchi or rales.  Abdominal:     Palpations: Abdomen is soft.     Tenderness: There is abdominal tenderness. There is no guarding or rebound.     Comments: Right flank tenderness  Musculoskeletal:     Cervical back: Normal range of motion and neck supple.     Right lower leg: No edema.     Left lower leg: No edema.  Skin:    General: Skin is warm and dry.     Findings: No rash.  Neurological:     General: No focal deficit present.     Mental Status: She is alert. Mental status is at baseline.     Motor: No weakness.  Psychiatric:        Mood and Affect: Mood normal.     ED Results / Procedures / Treatments   Labs (all labs ordered are listed, but only abnormal results are displayed) Labs Reviewed  URINALYSIS, ROUTINE W REFLEX MICROSCOPIC - Abnormal; Notable for the following components:      Result Value   Color, Urine ORANGE (*)    Hgb urine dipstick MODERATE (*)    Bilirubin Urine MODERATE (*)    Nitrite POSITIVE (*)    Bacteria, UA RARE (*)    All other components within normal limits  BASIC METABOLIC PANEL - Abnormal; Notable for the following components:   Creatinine, Ser 1.07 (*)    All other components within normal limits  PREGNANCY, URINE  CBC WITH DIFFERENTIAL/PLATELET    EKG None  Radiology CT Renal Stone Study Result Date: 05/25/2023 CLINICAL DATA:  Abdominal/flank pain, stone suspected EXAM: CT ABDOMEN AND PELVIS WITHOUT CONTRAST TECHNIQUE: Multidetector CT imaging of the abdomen and pelvis was performed following the standard protocol without IV  contrast. RADIATION DOSE REDUCTION: This exam was performed according to the departmental dose-optimization program which includes automated exposure control, adjustment of the mA and/or kV according to patient size and/or use of iterative reconstruction technique. COMPARISON:  CT scan abdomen and pelvis from 02/01/2020. FINDINGS: Lower chest: The lung bases are clear. No pleural effusion. The heart is normal in size. No pericardial effusion. Hepatobiliary: The liver is normal in size. Non-cirrhotic configuration. No suspicious mass. No intrahepatic or  extrahepatic bile duct dilation. Gallbladder is surgically absent. Pancreas: Unremarkable. No pancreatic ductal dilatation or surrounding inflammatory changes. Spleen: Within normal limits. No focal lesion. Adrenals/Urinary Tract: Adrenal glands are unremarkable. No suspicious renal mass within the limitations of this unenhanced exam. No left nephroureterolithiasis or obstructive uropathy. There is mild fullness in the right renal collecting system and mild right hydroureter. There is a 1-1.5 mm calcification along the posterior wall of the urinary bladder on the right side in the expected location of vesicoureteric junction region. Please note exit cords of right lower ureter is not well evaluated on this unenhanced exam. However, these constellation of observations are favored to represent a right UVJ calculus. No other right nephroureterolithiasis. Urinary bladder is under distended, precluding optimal assessment. However, no large mass identified. No perivesical fat stranding. Stomach/Bowel: No disproportionate dilation of the small or large bowel loops. No evidence of abnormal bowel wall thickening or inflammatory changes. The appendix is unremarkable. Vascular/Lymphatic: No ascites or pneumoperitoneum. No abdominal or pelvic lymphadenopathy, by size criteria. No aneurysmal dilation of the major abdominal arteries. Reproductive: Normal-size anteverted uterus.  There is a T-shaped intrauterine device, which is in position similar to the prior study. However, the device appears malpositioned with findings concerning for embedded short arms. Correlate clinically to determine the need for additional imaging with transvaginal ultrasound exam. No large adnexal mass. Other: There is a tiny fat containing umbilical hernia. The soft tissues and abdominal wall are otherwise unremarkable. Musculoskeletal: No suspicious osseous lesions. IMPRESSION: 1. As discussed above, findings favored right UVJ 1-1.5 mm calculus with resultant mild fullness in the right renal collecting system and mild right hydroureter. Correlate clinically. 2. No other nephroureterolithiasis on either side. No left obstructive uropathy. 3. Multiple other nonacute observations (such as status post cholecystectomy, suspected intrauterine device malpositioning, etc.), As described above. Electronically Signed   By: Ree Molt M.D.   On: 05/25/2023 10:32    Procedures Procedures    Medications Ordered in ED Medications  ketorolac  (TORADOL ) 15 MG/ML injection 15 mg (15 mg Intravenous Given 05/25/23 0948)    ED Course/ Medical Decision Making/ A&P    Patient seen and examined. History obtained directly from patient.   Labs/EKG: Ordered CBC, BMP, UA.  Imaging: Ordered CT renal protocol.  Medications/Fluids: Ordered: IV Toradol , IV Zofran .   Most recent vital signs reviewed and are as follows: BP (!) 132/90 (BP Location: Right Arm)   Pulse 80   Temp 98.9 F (37.2 C)   Resp 17   Ht 5' 6 (1.676 m)   Wt 57.6 kg   SpO2 100%   BMI 20.50 kg/m   Initial impression: Flank pain, concern for ureteral colic  10:34 AM Reassessment performed. Patient appears much more comfortable now.  Labs personally reviewed and interpreted including: CBC unremarkable; BMP creatinine minimally elevated at 1.07 otherwise unremarkable; UA orange due to Pyridium , likely causing false positive bili and  nitrite, red cells noted on microscopy, only 0-5 white blood cells, low concern for infection; pregnancy negative.  Reviewed pertinent lab work and imaging with patient at bedside. Questions answered.   Most current vital signs reviewed and are as follows: BP (!) 132/90 (BP Location: Right Arm)   Pulse 80   Temp 98.9 F (37.2 C)   Resp 17   Ht 5' 6 (1.676 m)   Wt 57.6 kg   SpO2 100%   BMI 20.50 kg/m   Plan: Reassess after CT results.  11:05 AM Reassessment performed. Patient appears comfortable.  Imaging  personally visualized and interpreted including: 1-1.5 mm right UVJ stone, possibly malpositioned IUD.  Reviewed pertinent lab work and imaging with patient at bedside.  We discussed the findings on CT including IUD, encouraged OB/GYN follow-up.  Patient states that placement of current IUD was difficult and required ultrasound guidance.  Questions answered.   Most current vital signs reviewed and are as follows: BP (!) 132/90 (BP Location: Right Arm)   Pulse 80   Temp 98.9 F (37.2 C)   Resp 17   Ht 5' 6 (1.676 m)   Wt 57.6 kg   SpO2 100%   BMI 20.50 kg/m   Plan: Discharge to home.  Patient comfortable with discharge.  Prescriptions written for: Diclofenac , Zofran , oxycodone  # 5 tablets  Patient counseled on use of narcotic pain medications. Counseled not to combine these medications with others containing tylenol . Urged not to drink alcohol, drive, or perform any other activities that requires focus while taking these medications. The patient verbalizes understanding and agrees with the plan.  Patient counseled on kidney stone treatment. Urged patient to strain urine and save any stones. Urged urology follow-up and return to [facility] with any complications. Counseled patient to maintain good fluid intake.                                 Medical Decision Making Amount and/or Complexity of Data Reviewed Labs: ordered. Radiology: ordered.  Risk Prescription drug  management.   For this patient's complaint of abdominal pain, the following conditions were considered on the differential diagnosis: gastritis/PUD, enteritis/duodenitis, appendicitis, cholelithiasis/cholecystitis, cholangitis, pancreatitis, ruptured viscus, colitis, diverticulitis, small/large bowel obstruction, proctitis, cystitis, pyelonephritis, ureteral colic, aortic dissection, aortic aneurysm. In women, ectopic pregnancy, pelvic inflammatory disease, ovarian cysts, and tubo-ovarian abscess were also considered. Atypical chest etiologies were also considered including ACS, PE, and pneumonia.  The patient's vital signs, pertinent lab work and imaging were reviewed and interpreted as discussed in the ED course. Hospitalization was considered for further testing, treatments, or serial exams/observation. However as patient is well-appearing, has a stable exam, and reassuring studies today, I do not feel that they warrant admission at this time. This plan was discussed with the patient who verbalizes agreement and comfort with this plan and seems reliable and able to return to the Emergency Department with worsening or changing symptoms.          Final Clinical Impression(s) / ED Diagnoses Final diagnoses:  Ureteral colic    Rx / DC Orders ED Discharge Orders     None         Desiderio Chew, PA-C 05/25/23 1106    Pamella Ozell LABOR, DO 05/28/23 2045

## 2023-06-10 ENCOUNTER — Other Ambulatory Visit: Payer: Self-pay

## 2023-06-10 MED ORDER — VALACYCLOVIR HCL 1 G PO TABS: 2000.0000 mg | ORAL_TABLET | Freq: Two times a day (BID) | ORAL | 2 refills | Status: AC

## 2023-06-15 ENCOUNTER — Other Ambulatory Visit: Payer: Self-pay

## 2023-06-15 DIAGNOSIS — O219 Vomiting of pregnancy, unspecified: Secondary | ICD-10-CM

## 2023-06-15 MED ORDER — ONDANSETRON 4 MG PO TBDP: ORAL_TABLET | ORAL | 0 refills | Status: AC

## 2023-06-17 ENCOUNTER — Other Ambulatory Visit: Payer: Self-pay | Admitting: Family

## 2023-10-08 ENCOUNTER — Ambulatory Visit: Payer: Self-pay | Admitting: Nurse Practitioner

## 2024-03-30 ENCOUNTER — Encounter: Payer: Self-pay | Admitting: Nurse Practitioner

## 2024-04-25 DIAGNOSIS — Z1231 Encounter for screening mammogram for malignant neoplasm of breast: Secondary | ICD-10-CM

## 2024-05-19 ENCOUNTER — Ambulatory Visit
Admission: RE | Admit: 2024-05-19 | Discharge: 2024-05-19 | Disposition: A | Source: Ambulatory Visit | Attending: Nurse Practitioner | Admitting: Nurse Practitioner

## 2024-05-19 DIAGNOSIS — Z1231 Encounter for screening mammogram for malignant neoplasm of breast: Secondary | ICD-10-CM
